# Patient Record
Sex: Female | Born: 1990 | Race: Black or African American | Hispanic: No | Marital: Single | Smoking: Never smoker
Health system: Southern US, Community
[De-identification: ages and names within clinical notes are randomized; demographics above are authoritative.]

## PROBLEM LIST (undated history)

## (undated) DIAGNOSIS — F329 Major depressive disorder, single episode, unspecified: Secondary | ICD-10-CM

## (undated) DIAGNOSIS — Z8759 Personal history of other complications of pregnancy, childbirth and the puerperium: Secondary | ICD-10-CM

## (undated) DIAGNOSIS — F32A Depression, unspecified: Secondary | ICD-10-CM

## (undated) DIAGNOSIS — N12 Tubulo-interstitial nephritis, not specified as acute or chronic: Secondary | ICD-10-CM

## (undated) DIAGNOSIS — Z8619 Personal history of other infectious and parasitic diseases: Secondary | ICD-10-CM

## (undated) DIAGNOSIS — O24419 Gestational diabetes mellitus in pregnancy, unspecified control: Secondary | ICD-10-CM

## (undated) DIAGNOSIS — G43909 Migraine, unspecified, not intractable, without status migrainosus: Secondary | ICD-10-CM

## (undated) HISTORY — DX: Depression, unspecified: F32.A

## (undated) HISTORY — DX: Personal history of other infectious and parasitic diseases: Z86.19

## (undated) HISTORY — DX: Gestational diabetes mellitus in pregnancy, unspecified control: O24.419

## (undated) HISTORY — DX: Personal history of other complications of pregnancy, childbirth and the puerperium: Z87.59

## (undated) HISTORY — DX: Major depressive disorder, single episode, unspecified: F32.9

## (undated) HISTORY — DX: Tubulo-interstitial nephritis, not specified as acute or chronic: N12

## (undated) HISTORY — DX: Migraine, unspecified, not intractable, without status migrainosus: G43.909

---
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
## 2009-05-18 ENCOUNTER — Emergency Department: Payer: Self-pay | Admitting: Emergency Medicine

## 2009-06-13 ENCOUNTER — Emergency Department: Payer: Self-pay | Admitting: Emergency Medicine

## 2009-07-09 DIAGNOSIS — N390 Urinary tract infection, site not specified: Secondary | ICD-10-CM | POA: Insufficient documentation

## 2009-07-09 DIAGNOSIS — Z8619 Personal history of other infectious and parasitic diseases: Secondary | ICD-10-CM | POA: Insufficient documentation

## 2009-07-09 DIAGNOSIS — N12 Tubulo-interstitial nephritis, not specified as acute or chronic: Secondary | ICD-10-CM

## 2009-07-09 HISTORY — DX: Tubulo-interstitial nephritis, not specified as acute or chronic: N12

## 2009-07-09 HISTORY — DX: Personal history of other infectious and parasitic diseases: Z86.19

## 2009-07-13 ENCOUNTER — Emergency Department: Payer: Self-pay | Admitting: Emergency Medicine

## 2009-09-06 ENCOUNTER — Inpatient Hospital Stay: Payer: Self-pay | Admitting: Obstetrics & Gynecology

## 2009-12-01 ENCOUNTER — Observation Stay: Payer: Self-pay

## 2009-12-03 ENCOUNTER — Observation Stay: Payer: Self-pay

## 2009-12-26 ENCOUNTER — Observation Stay: Payer: Self-pay

## 2010-01-09 ENCOUNTER — Inpatient Hospital Stay: Payer: Self-pay | Admitting: Obstetrics & Gynecology

## 2010-07-30 ENCOUNTER — Observation Stay: Payer: Self-pay | Admitting: Obstetrics and Gynecology

## 2010-10-01 DIAGNOSIS — O1423 HELLP syndrome (HELLP), third trimester: Secondary | ICD-10-CM

## 2011-11-29 DIAGNOSIS — O24419 Gestational diabetes mellitus in pregnancy, unspecified control: Secondary | ICD-10-CM

## 2012-08-04 ENCOUNTER — Emergency Department: Payer: Self-pay | Admitting: Emergency Medicine

## 2013-02-05 ENCOUNTER — Inpatient Hospital Stay: Payer: Self-pay

## 2013-02-05 LAB — URINALYSIS, COMPLETE
Bilirubin,UR: NEGATIVE
Glucose,UR: NEGATIVE mg/dL (ref 0–75)
Nitrite: NEGATIVE
Protein: NEGATIVE
RBC,UR: 1 /HPF (ref 0–5)
Specific Gravity: 1.011 (ref 1.003–1.030)
Squamous Epithelial: 31
WBC UR: 7 /HPF (ref 0–5)

## 2013-02-05 LAB — CBC WITH DIFFERENTIAL/PLATELET
Basophil #: 0 10*3/uL (ref 0.0–0.1)
Basophil %: 0.5 %
Eosinophil #: 0.1 10*3/uL (ref 0.0–0.7)
HGB: 10.1 g/dL — ABNORMAL LOW (ref 12.0–16.0)
Monocyte #: 0.9 x10 3/mm (ref 0.2–0.9)
Neutrophil %: 69.4 %
Platelet: 162 10*3/uL (ref 150–440)
RBC: 3.88 10*6/uL (ref 3.80–5.20)
WBC: 11 10*3/uL (ref 3.6–11.0)

## 2013-02-05 LAB — BASIC METABOLIC PANEL
Anion Gap: 7 (ref 7–16)
BUN: 11 mg/dL (ref 7–18)
Calcium, Total: 8.9 mg/dL (ref 8.5–10.1)
Co2: 24 mmol/L (ref 21–32)
Creatinine: 0.46 mg/dL — ABNORMAL LOW (ref 0.60–1.30)
EGFR (African American): >60
EGFR (Non-African Amer.): >60
Glucose: 88 mg/dL (ref 65–99)
Potassium: 3.7 mmol/L (ref 3.5–5.1)
Sodium: 136 mmol/L (ref 136–145)

## 2013-02-05 LAB — FETAL FIBRONECTIN: Fetal Fibronectin: NEGATIVE

## 2013-02-07 LAB — URINE CULTURE

## 2013-02-08 ENCOUNTER — Observation Stay: Payer: Self-pay | Admitting: Obstetrics and Gynecology

## 2013-02-08 LAB — URINALYSIS, COMPLETE
Nitrite: NEGATIVE
Ph: 7 (ref 4.5–8.0)
Protein: NEGATIVE
RBC,UR: 1 /HPF (ref 0–5)
Squamous Epithelial: 2
WBC UR: 1 /HPF (ref 0–5)

## 2013-02-08 LAB — BETA STREP CULTURE(ARMC)

## 2013-02-09 LAB — FETAL FIBRONECTIN
Appearance: NORMAL
Fetal Fibronectin: NEGATIVE

## 2013-02-09 LAB — GC/CHLAMYDIA PROBE AMP

## 2013-02-09 LAB — WET PREP, GENITAL

## 2013-02-14 ENCOUNTER — Observation Stay: Payer: Self-pay | Admitting: Obstetrics and Gynecology

## 2013-02-14 LAB — DRUG SCREEN, URINE
Amphetamines, Ur Screen: NEGATIVE (ref ?–1000)
Barbiturates, Ur Screen: NEGATIVE (ref ?–200)
Benzodiazepine, Ur Scrn: NEGATIVE (ref ?–200)
Cannabinoid 50 Ng, Ur ~~LOC~~: NEGATIVE (ref ?–50)
Cocaine Metabolite,Ur ~~LOC~~: NEGATIVE (ref ?–300)
MDMA (Ecstasy)Ur Screen: NEGATIVE (ref ?–500)
Methadone, Ur Screen: NEGATIVE (ref ?–300)
Phencyclidine (PCP) Ur S: NEGATIVE (ref ?–25)

## 2013-02-14 LAB — WBC: WBC: 10.9 10*3/uL (ref 3.6–11.0)

## 2013-02-14 LAB — HEMOGLOBIN: HGB: 9.5 g/dL — ABNORMAL LOW (ref 12.0–16.0)

## 2013-02-14 LAB — PLATELET COUNT: Platelet: 166 10*3/uL (ref 150–440)

## 2013-02-20 ENCOUNTER — Observation Stay: Payer: Self-pay | Admitting: Obstetrics and Gynecology

## 2013-02-20 ENCOUNTER — Ambulatory Visit (HOSPITAL_COMMUNITY)
Admission: AD | Admit: 2013-02-20 | Discharge: 2013-02-20 | Disposition: A | Discharge status: Home / Self Care | Payer: Medicaid Other | Source: Other Acute Inpatient Hospital | Attending: Obstetrics and Gynecology | Admitting: Obstetrics and Gynecology

## 2013-02-20 DIAGNOSIS — O2690 Pregnancy related conditions, unspecified, unspecified trimester: Secondary | ICD-10-CM | POA: Insufficient documentation

## 2013-02-20 LAB — BASIC METABOLIC PANEL
BUN: 8 mg/dL (ref 7–18)
Creatinine: 0.39 mg/dL — ABNORMAL LOW (ref 0.60–1.30)
Potassium: 4.5 mmol/L (ref 3.5–5.1)

## 2013-02-20 LAB — HEMOGLOBIN A1C: Hemoglobin A1C: 5.8 % (ref 4.2–6.3)

## 2013-02-28 ENCOUNTER — Inpatient Hospital Stay: Payer: Self-pay | Admitting: Obstetrics and Gynecology

## 2013-02-28 LAB — URINALYSIS, COMPLETE
Bilirubin,UR: NEGATIVE
Blood: NEGATIVE
Glucose,UR: NEGATIVE mg/dL (ref 0–75)
Ketone: NEGATIVE
Nitrite: NEGATIVE
Protein: NEGATIVE
Specific Gravity: 1.005 (ref 1.003–1.030)

## 2013-02-28 LAB — FETAL FIBRONECTIN: Fetal Fibronectin: NEGATIVE

## 2013-02-28 LAB — GC/CHLAMYDIA PROBE AMP

## 2013-03-01 DIAGNOSIS — O4700 False labor before 37 completed weeks of gestation, unspecified trimester: Secondary | ICD-10-CM

## 2013-03-01 DIAGNOSIS — R072 Precordial pain: Secondary | ICD-10-CM

## 2013-03-01 LAB — DRUG SCREEN, URINE
Amphetamines, Ur Screen: NEGATIVE (ref ?–1000)
Benzodiazepine, Ur Scrn: NEGATIVE (ref ?–200)
Cocaine Metabolite,Ur ~~LOC~~: NEGATIVE (ref ?–300)
Methadone, Ur Screen: NEGATIVE (ref ?–300)
Opiate, Ur Screen: NEGATIVE (ref ?–300)
Phencyclidine (PCP) Ur S: NEGATIVE (ref ?–25)

## 2013-03-02 LAB — URINE CULTURE

## 2013-03-06 ENCOUNTER — Observation Stay: Payer: Self-pay | Admitting: Obstetrics and Gynecology

## 2013-03-13 ENCOUNTER — Observation Stay: Payer: Self-pay | Admitting: Obstetrics and Gynecology

## 2013-03-17 ENCOUNTER — Observation Stay: Payer: Self-pay | Admitting: Obstetrics and Gynecology

## 2013-03-17 LAB — URINALYSIS, COMPLETE
Bilirubin,UR: NEGATIVE
Blood: NEGATIVE
Glucose,UR: NEGATIVE mg/dL (ref 0–75)
Ketone: NEGATIVE
Nitrite: NEGATIVE
Ph: 7 (ref 4.5–8.0)
Protein: NEGATIVE
RBC,UR: 4 /HPF (ref 0–5)
Specific Gravity: 1.006 (ref 1.003–1.030)
WBC UR: 51 /HPF (ref 0–5)

## 2013-03-17 LAB — CBC WITH DIFFERENTIAL/PLATELET
Basophil #: 0 10*3/uL (ref 0.0–0.1)
Eosinophil %: 0.7 %
HCT: 30.7 % — ABNORMAL LOW (ref 35.0–47.0)
HGB: 9.8 g/dL — ABNORMAL LOW (ref 12.0–16.0)
Lymphocyte %: 24.3 %
MCH: 24.5 pg — ABNORMAL LOW (ref 26.0–34.0)
MCHC: 31.9 g/dL — ABNORMAL LOW (ref 32.0–36.0)
MCV: 77 fL — ABNORMAL LOW (ref 80–100)
Monocyte #: 0.9 x10 3/mm (ref 0.2–0.9)
Neutrophil %: 66.1 %
Platelet: 159 10*3/uL (ref 150–440)

## 2013-03-18 LAB — DRUG SCREEN, URINE
Amphetamines, Ur Screen: NEGATIVE (ref ?–1000)
Methadone, Ur Screen: NEGATIVE (ref ?–300)
Opiate, Ur Screen: NEGATIVE (ref ?–300)
Phencyclidine (PCP) Ur S: NEGATIVE (ref ?–25)

## 2013-03-19 ENCOUNTER — Observation Stay: Payer: Self-pay | Admitting: Obstetrics and Gynecology

## 2013-03-25 ENCOUNTER — Inpatient Hospital Stay: Payer: Self-pay | Admitting: Obstetrics & Gynecology

## 2013-03-25 LAB — CBC WITH DIFFERENTIAL/PLATELET
Basophil %: 0.2 %
Eosinophil #: 0 10*3/uL (ref 0.0–0.7)
HCT: 31.5 % — ABNORMAL LOW (ref 35.0–47.0)
Lymphocyte #: 1.7 10*3/uL (ref 1.0–3.6)
Lymphocyte %: 9 %
MCH: 24.4 pg — ABNORMAL LOW (ref 26.0–34.0)
MCV: 77 fL — ABNORMAL LOW (ref 80–100)
Monocyte #: 0.9 x10 3/mm (ref 0.2–0.9)
Monocyte %: 4.8 %
Neutrophil #: 16.4 10*3/uL — ABNORMAL HIGH (ref 1.4–6.5)
Platelet: 167 10*3/uL (ref 150–440)
RDW: 16.7 % — ABNORMAL HIGH (ref 11.5–14.5)
WBC: 19.1 10*3/uL — ABNORMAL HIGH (ref 3.6–11.0)

## 2013-03-25 LAB — GC/CHLAMYDIA PROBE AMP

## 2013-03-26 LAB — HEMATOCRIT: HCT: 32.3 % — ABNORMAL LOW (ref 35.0–47.0)

## 2013-07-15 ENCOUNTER — Emergency Department: Payer: Self-pay | Admitting: Internal Medicine

## 2013-07-15 LAB — RAPID INFLUENZA A&B ANTIGENS (ARMC ONLY)

## 2013-10-16 ENCOUNTER — Emergency Department: Payer: Self-pay | Admitting: Emergency Medicine

## 2014-08-13 ENCOUNTER — Observation Stay: Payer: Self-pay | Admitting: Obstetrics and Gynecology

## 2014-08-13 LAB — COMPREHENSIVE METABOLIC PANEL
ANION GAP: 9 (ref 7–16)
AST: 13 U/L — AB (ref 15–37)
Albumin: 2.2 g/dL — ABNORMAL LOW (ref 3.4–5.0)
Alkaline Phosphatase: 54 U/L (ref 46–116)
BUN: 9 mg/dL (ref 7–18)
Bilirubin,Total: 0.4 mg/dL (ref 0.2–1.0)
CALCIUM: 8.5 mg/dL (ref 8.5–10.1)
CHLORIDE: 105 mmol/L (ref 98–107)
Co2: 19 mmol/L — ABNORMAL LOW (ref 21–32)
Creatinine: 0.36 mg/dL — ABNORMAL LOW (ref 0.60–1.30)
Osmolality: 273 (ref 275–301)
POTASSIUM: 3.9 mmol/L (ref 3.5–5.1)
SGPT (ALT): 10 U/L — ABNORMAL LOW (ref 14–63)
Sodium: 133 mmol/L — ABNORMAL LOW (ref 136–145)
Total Protein: 6.5 g/dL (ref 6.4–8.2)

## 2014-08-13 LAB — CBC WITH DIFFERENTIAL/PLATELET
Basophil #: 0.1 10*3/uL (ref 0.0–0.1)
Basophil %: 0.5 %
EOS ABS: 0.1 10*3/uL (ref 0.0–0.7)
Eosinophil %: 0.7 %
HCT: 37.5 % (ref 35.0–47.0)
HGB: 12.2 g/dL (ref 12.0–16.0)
LYMPHS PCT: 20.6 %
Lymphocyte #: 2.1 10*3/uL (ref 1.0–3.6)
MCH: 26.7 pg (ref 26.0–34.0)
MCHC: 32.7 g/dL (ref 32.0–36.0)
MCV: 82 fL (ref 80–100)
Monocyte #: 0.7 x10 3/mm (ref 0.2–0.9)
Monocyte %: 7 %
NEUTROS PCT: 71.2 %
Neutrophil #: 7.3 10*3/uL — ABNORMAL HIGH (ref 1.4–6.5)
Platelet: 161 10*3/uL (ref 150–440)
RBC: 4.59 10*6/uL (ref 3.80–5.20)
RDW: 14.7 % — AB (ref 11.5–14.5)
WBC: 10.2 10*3/uL (ref 3.6–11.0)

## 2014-08-13 LAB — DRUG SCREEN, URINE

## 2014-08-14 LAB — GC/CHLAMYDIA PROBE AMP

## 2014-08-31 ENCOUNTER — Observation Stay: Payer: Self-pay | Admitting: Obstetrics & Gynecology

## 2014-09-08 ENCOUNTER — Observation Stay: Payer: Self-pay | Admitting: Obstetrics & Gynecology

## 2014-09-17 ENCOUNTER — Observation Stay: Payer: Self-pay | Admitting: Obstetrics and Gynecology

## 2014-09-17 ENCOUNTER — Ambulatory Visit (HOSPITAL_COMMUNITY)
Admission: AD | Admit: 2014-09-17 | Discharge: 2014-09-17 | Disposition: A | Discharge status: Home / Self Care | Payer: Medicaid Other | Source: Other Acute Inpatient Hospital | Attending: Obstetrics and Gynecology | Admitting: Obstetrics and Gynecology

## 2014-09-17 DIAGNOSIS — O24419 Gestational diabetes mellitus in pregnancy, unspecified control: Secondary | ICD-10-CM | POA: Insufficient documentation

## 2014-10-09 ENCOUNTER — Inpatient Hospital Stay
Admit: 2014-10-09 | Disposition: A | Discharge status: Home / Self Care | Payer: Self-pay | Attending: Obstetrics and Gynecology | Admitting: Obstetrics and Gynecology

## 2014-10-09 LAB — BASIC METABOLIC PANEL
ANION GAP: 7 (ref 7–16)
BUN: 7 mg/dL
CHLORIDE: 109 mmol/L
CO2: 18 mmol/L — AB
Calcium, Total: 8.5 mg/dL — ABNORMAL LOW
Creatinine: 0.42 mg/dL — ABNORMAL LOW
EGFR (African American): >60
GLUCOSE: 210 mg/dL — AB
Potassium: 3.8 mmol/L
SODIUM: 134 mmol/L — AB

## 2014-10-09 LAB — CBC WITH DIFFERENTIAL/PLATELET
BASOS ABS: 0 10*3/uL (ref 0.0–0.1)
BASOS PCT: 0.2 %
EOS ABS: 0.1 10*3/uL (ref 0.0–0.7)
Eosinophil %: 0.7 %
HCT: 38.4 % (ref 35.0–47.0)
HGB: 12.3 g/dL (ref 12.0–16.0)
LYMPHS ABS: 1.6 10*3/uL (ref 1.0–3.6)
LYMPHS PCT: 10.7 %
MCH: 26.3 pg (ref 26.0–34.0)
MCHC: 32 g/dL (ref 32.0–36.0)
MCV: 82 fL (ref 80–100)
MONO ABS: 0.8 x10 3/mm (ref 0.2–0.9)
MONOS PCT: 5.8 %
NEUTROS ABS: 12 10*3/uL — AB (ref 1.4–6.5)
Neutrophil %: 82.6 %
Platelet: 162 10*3/uL (ref 150–440)
RBC: 4.68 10*6/uL (ref 3.80–5.20)
RDW: 16.1 % — ABNORMAL HIGH (ref 11.5–14.5)
WBC: 14.5 10*3/uL — ABNORMAL HIGH (ref 3.6–11.0)

## 2014-10-09 LAB — GC/CHLAMYDIA PROBE AMP

## 2014-10-10 HISTORY — PX: OTHER SURGICAL HISTORY: SHX169

## 2014-10-10 LAB — HEMATOCRIT: HCT: 41 % (ref 35.0–47.0)

## 2014-10-29 NOTE — Consult Note (Signed)
   Maternal Age 24   Gravida 5   Para 3   Term Deliveries 3   Preterm Deliveries 1   Abortions 0   Living Children 3   GA Assessment: (Weeks) 31 week(s)   (Days) 3 day(s)   Gestation Single   Blood Type (Maternal) AB positive   Antibody Screen Results (Maternal) negative   Indirect Coombs Negative   HIV Results (Maternal) negative   Gonorrhea Results (Maternal) negative   Chlamydia Results (Maternal) negative   Hepatitis C Culture (Maternal) unknown   Herpes Results (Maternal) n/a   VDRL/RPR/Syphilis Results (Maternal) negative   Varicella Titer Results (Maternal) Unknown   Rubella Results (Maternal) immune   Hepatitis B Surface Antigen Results (Maternal) negative   Group B Strep Results Maternal (Result >5wks must be treated as unknown) positive   Other Maternal Labs GC/chlamydia negative   Prenatal Care Poor    Additional Comments Mother presented to L&D with complaint of pelvic pain and mucus discharge x 2 days; pregnancy complicated by gestational diabetes, history of preterm labor and received betamethasone x 2 doses; upon arrival, magnesium sulfate initiated and clindamycin for GBS prophylaxis.  I met with mother and father and discussed complication of delivery at this gestational age including respiratory problems, temperature instability, feeding issues, likely length of stay, etc.  Parents verbalized understanding.  Neonatology will be present at delivery; please reconsult for questions as needed.  Attending addendum: I have discussed this case with NNP Maxime Beckner, and I agree with her note as above.   Parental Contact Parents informed at length regarding prenatal care and plan   Electronic Signatures: Nada Maclachlan (NP)  (Signed 23-Aug-14 19:42)  Authored: PREGNANCY and LABOR, ADDITIONAL COMMENTS Rhona Raider (MD)  (Signed 24-Aug-14 11:10)  Authored: ADDITIONAL COMMENTS  Co-Signer: PREGNANCY and LABOR, ADDITIONAL COMMENTS   Last  Updated: 24-Aug-14 11:10 by Rhona Raider (MD)

## 2014-11-01 LAB — SURGICAL PATHOLOGY

## 2014-11-07 NOTE — Op Note (Signed)
PATIENT NAME:  Chelsea Vazquez, Chelsea Vazquez MR#:  [id number] DATE OF BIRTH:  Nov 14, 1990  DATE OF PROCEDURE:  10/10/2014  PREOPERATIVE DIAGNOSES:  1. Postpartum.  2. Multiparity, desires permanent sterility.  POSTOPERATIVE DIAGNOSES:  1. Postpartum.  2. Multiparity, desires permanent sterility.   PROCEDURE: Postpartum bilateral tubal ligation.   ANESTHESIA: General.   SURGEON: Conard NovakStephen D. Deke Tilghman, M.D.   ANESTHESIA: General.   ESTIMATED BLOOD LOSS: Minimal.   OPERATIVE BLOOD FLUIDS: 1000 mL crystalloid.   COMPLICATIONS: None.   FINDINGS: Normal-appearing uterine fundus, bilateral fallopian tubes.   SPECIMENS: Portion of right and left fallopian tube.   CONDITION AT THE END OF THE PROCEDURE: Stable.   PROCEDURE IN DETAIL: The patient was taken to the operating room where general anesthesia was administered and found to be adequate. A small transverse infraumbilical incision was made with a scalpel. The incision was carried down through the underlying fascia, which was tagged with a 2-0 Vicryl on a UR-6 needle. The peritoneum was identified and entered. The peritoneum was noted to be free of any adhesions, and the incision was then extended with a scalpel.   The right fallopian tube was identified, brought to the incision, and grasped with a Babcock clamp. The tube was then followed out to the fimbria. The Babcock clamp was then used to grasp the tube approximately 4 cm from the cornual region. A 3 cm segment of tube was then ligated with a free tie of plain gut. A second free tie of plain gut was then used to reinforce the initial one. This segment was then excised using Metzenbaum scissors. Good hemostasis was noted and the tube was returned to the abdomen. The left fallopian tube was then ligated in a similar fashion and a 3 cm segment was excised. Hemostasis was noted and the tube was returned to the abdomen. Again, the tube was followed out to the fimbriated end to assure the procedure was  carried out on the correct structure.   The fascia was then closed in a single layer using #2-0 Vicryl. The skin was closed in a subcuticular fashion using 3-0 Vicryl undyed. The closure was then reinforced using surgical skin glue.   The patient tolerated the procedure well. Sponge, lap, and needle counts were correct x 2. The patient was taken to the recovery room in stable condition.    ____________________________ Conard NovakStephen D. Falyn Rubel, MD sdj:JT D: 10/10/2014 10:38:51 ET T: 10/10/2014 12:01:12 ET JOB#: [id number]  cc: Conard NovakStephen D. Jeb Schloemer, MD, <Dictator> Conard NovakSTEPHEN D Margeret Stachnik MD ELECTRONICALLY SIGNED 11/04/2014 9:54

## 2014-11-07 NOTE — Consult Note (Signed)
Chief Complaint and History:  Chief Complaint Diabetes   Allergies:  Amoxicillin: Swelling, Itching  Assessment/Plan:  Assessment/Plan 24 yo F s/p vag delivery yesterday seen in consultation for diabetes mellitus. States she has a h/o gestation diabetes with a prior pregnancy. Not diagnosed with diabetes in this pregnancy until 08/2014. Started on insulin and on admission, she was taking Lantus 55 units qhs and NovoLog 15 units tid AC. She has a glucometer & monitors sugars. Baby boy born at 34 weeks weighing 4 lbs 15 oz. Does not plan to breast feed. Sugars now in the range of 148-232. Over last 24 hours has receied 14 units NovoLog. No Lantus given. She has no complaints. Appetite is fair.  She was examined and chart was reviewed.   A/ Diabetes mellitus - gestational vs type 2  P/ Take Lantus 10 units nightly Take NovoLog correction qAC: if sugar 150-200 take 4 units, if sugar 201-250 tkae 6 units, if sugar over 251 take 8 units Instructions written out and reviewed with her. Will arrange out-patient follow up in my clinic in 2 weeks.   Electronic Signatures: Raj JanusSolum, Anna M (MD)  (Signed 04-Apr-16 13:50)  Authored: Chief Complaint and History, ALLERGIES, Assessment/Plan   Last Updated: 04-Apr-16 13:50 by Raj JanusSolum, Anna M (MD)

## 2014-11-07 NOTE — Consult Note (Signed)
PATIENT NAME:  Chelsea Vazquez, Chelsea Vazquez MR#:  [id number] DATE OF BIRTH:  [birth year]  DATE OF CONSULTATION:  10/11/2014  REFERRING PHYSICIAN:  Marta Antu, CNM CONSULTING PHYSICIAN:  A. Wendall Mola, MD  CHIEF COMPLAINT: Diabetes.   HISTORY OF PRESENT ILLNESS: This is a 24 year old female G6, P2-3-0-4 admitted 2 days ago in labor, status post vaginal delivery on 10/09/2014. The patient was diagnosed in February 2016 with diabetes, and started on insulin. Prior to admission she was taking Lantus 55 units daily and NovoLog 15 units t.i.d. before meals. States she monitors blood sugars regularly. Since admission, she has been given NovoLog insulin sliding scale. Over the last 24 hours, blood sugars have been in the range of 148 to 232 and she has received approximately 16 units of NovoLog insulin. Appetite is good. Does not plan to breast feed. States she had gestational diabetes with a prior pregnancy. No recent hemoglobin A1c to review.  PAST MEDICAL HISTORY: 1.  Gestational diabetes.  2.  Depression.   PAST SURGICAL HISTORY: Tubal ligation 10/10/2014.  OUTPATIENT MEDICATIONS: 1.  Lantus 55 units daily.  2.  NovoLog insulin 15 units t.i.d. before meals.   ALLERGIES: AMOXICILLIN.   SOCIAL HISTORY: This is the patient's fifth child. She is not married. Denies use of tobacco and alcohol.   FAMILY HISTORY: Both sets of grandparents had type 2 diabetes. One cousin with type 1 diabetes. One sister with borderline diabetes.  REVIEW OF SYSTEMS:  GENERAL: No weight loss.  No fever.  HEENT: No blurred vision. No sore throat.  NECK: No neck pain. No dysphasia.  CARDIAC: No chest pain or palpitation.  PULMONARY: No cough. No shortness of breath.  ABDOMEN: No abdominal pain at this time. Appetite is fair.  EXTREMITIES: Denies lower extremity swelling.  SKIN: Denies rash or recent skin changes.  ENDOCRINE: Denies heat or cold intolerance.  GENITOURINARY: Denies dysuria or hematuria.   PHYSICAL  EXAMINATION: VITAL SIGNS: Height 60 inches, weight 115 pounds, BMI 22.5, temperature 97.1, pulse 73, respirations 17, BP 109/69.  GENERAL: African American female in no acute distress, petite.  HEENT: EOMI. Oropharynx is clear. Mucous membranes moist.  NECK: Supple. No appreciable thyromegaly. No thyroid bruit.  LUNGS: Clear to auscultation bilaterally. No wheeze.  ABDOMEN: Diffusely soft, distended due to pregnancy.  CARDIAC: Regular rate and rhythm.  EXTREMITIES: No peripheral edema.  NEUROLOGIC: No dysarthria. No tremor of outstretched hands.  SKIN: No rash or dermatopathy noted.  PSYCHIATRIC: Calm, cooperative.   LABORATORY DATA: On 10/09/2014, glucose 210, BUN 7, creatinine 0.42, sodium 134, CO2 18, calcium 8.5. WBC 14.5, platelets 162,000, hemoglobin 12.3, hematocrit 38.4.   ASSESSMENT: Diabetes mellitus, likely type II, uncontrolled.   RECOMMENDATIONS: 1.  Given her recent insulin requirements during hospitalization, I recommend she be discharged on a regimen of Lantus 10 units at bedtime and NovoLog before meals: If sugar 150 to 200 then four units, if 201 to 250 then 6 units, if greater than 251 then 8 units.  2.  Insulin instructions were written out for the patient today.  3.  Check blood sugars 3 times daily.  4.  Will arrange for outpatient follow-up in my clinic in 1 to 2 weeks. Request the patient bring her glucometer to that clinic appointment. 5.  Reviewed goals of diabetes including goal blood sugars. I have provided her with my information and encouraged her to call the clinic if there are any questions or concerns.   Thank you for the kind request for consultation.  ____________________________ A. Wendall MolaMelissa Arcangel Minion, MD ams:sb D: 10/11/2014 13:59:31 ET T: 10/11/2014 14:57:12 ET JOB#: [id number]  cc: A. Wendall MolaMelissa Poonam Woehrle, MD, <Dictator> Macy MisA. MELISSA Penni Penado MD ELECTRONICALLY SIGNED 10/15/2014 17:26

## 2014-11-16 NOTE — H&P (Signed)
L&D Evaluation:  History Expanded:  HPI 24 yo G5P2203 at 7261w5d gestational age by LMP, pregnancy complicated by history of preterm delivery with G4 (35 weeks), stillborn with G3 at 7 months, this pregnancy complicated by GDM.  She initiated prenatal care in [place] and has moved to the area and has not yet established a care provider. She had an appointment on 8/7 at [facility] to initiate care.  She was seen on L&D 3 days ago for uterine contractions and had a negative fetal fibronectin and was discharged.  She presents today after noting contractions at about 1030-1100pm last night. They were coming about every 3 minutes. She also noted that she was having episodes of diarrhea with contractions.  She also had nausea but no emesis with each contraction.  She denies fevers, chills, urinary, and vaginal symptoms.   Gravida 5   Term 2   PreTerm 2   Abortion 0   Living 3   Blood Type (Maternal) AB positive   Group B Strep Results Maternal (Result >5wks must be treated as unknown) unknown/result > 5 weeks ago   [facility]   Patient's Medical History 1) asthma, 2) history of migraine headaches, 3) Depression, 4) history of trichomonas   Patient's Surgical History none   Medications Pre Natal Vitamins   Allergies amoxicillin - throat closes   Social History none   Family History Non-Contributory   ROS:  ROS All systems were reviewed.  HEENT, CNS, GI, GU, Respiratory, CV, Renal and Musculoskeletal systems were found to be normal., unless noted in HPI   Exam:  Vital Signs AFVSS T98.4, RR16, P95, BP118/62   Urine Protein negative dipstick   General no apparent distress   Mental Status clear   Chest clear   Heart normal sinus rhythm, II/VI systolic ejection murmur   Abdomen gravid, non-tender   Back no CVAT   Edema no edema   Pelvic no external lesions, 1/long/hi (no change over two hours), recheck 5 hours later with no change   Mebranes Intact   FHT  normal rate with no decels   Ucx irregular, 2-3 q 10 min - spaced out overnight   Impression:  Impression No current active labor   Plan:  Plan UA   Comments - Wet prep neg - Gonorrhea/chlamydia neg - no evidence of urinary tract infection - s/p 1 liter bolus of LR - She was called "finger tip" on 7/31 and was administered BMTZ x 1 dose on the morning 8/1.  She did not return for her second dose.  She was called "finger tip" by the RN this morning.  When comparing exams, her finger-tip exam is equivalent to my 1cm exam.  Therefore, it is unlikely she has had any change.  Will discharge home with follow up in 3 days for [facility]. Will give second dose of her course of betamethasone as she did not return for it on 8/2.  precautions given.   Follow Up Appointment already scheduled. 02/12/13   Labs:  Lab Results:  BF Analysis:  04-Aug-14 23:32   Fetal Fibronectin (comp) NEGATIVE-Fetal Fibronectin NOT Detected.  Appearance NORMAL  Characteristic CLEAR, COLORLESS, AQUEOUS Specimen characteristic that may be outside the parameter of a clear, colorless, aqueous sample (i.e. pink, bloody, yellow, thick, mucus-like) may cause an invalid or false positive result.  Routine Micro:  04-Aug-14 01:31   Micro Text Report WET PREP   COMMENT  FEW WHITE BLOOD CELLS SEEN   COMMENT                   NO TRICHOMONAS,SPERMATOZOA,YEAST,OR CLUE CELLS SEEN   ANTIBIOTIC                       Micro Text Report CHLAM/N.GC RT-PCR ([facility])   CHLAMYDIA                 CHLAMYDIA TRACHOMATIS NEGATIVE   N.GONORRHOEAE             N.GONORRHOEAE NEGATIVE   ANTIBIOTIC                       Comment 1. FEW WHITE BLOOD CELLS SEEN  Comment 2. NO TRICHOMONAS,SPERMATOZOA,YEAST,OR CLUE CELLS SEEN  Result(s) reported on 09 Feb 2013 at 01:40AM.  Routine UA:  03-Aug-14 23:32   Color (UA) Yellow  Clarity (UA) Clear  Glucose (UA) Negative  Bilirubin (UA) Negative   Ketones (UA) Negative  Specific Gravity (UA) 1.010  Blood (UA) Negative  pH (UA) 7.0  Protein (UA) Negative  Nitrite (UA) Negative  Leukocyte Esterase (UA) Trace (Result(s) reported on 08 Feb 2013 at 11:53PM.)  RBC (UA) 1 /HPF  WBC (UA) 1 /HPF  Bacteria (UA) NONE SEEN  Epithelial Cells (UA) 2 /HPF  Mucous (UA) PRESENT (Result(s) reported on 08 Feb 2013 at 11:53PM.)   Electronic Signatures: Conard NovakJackson, Sy Saintjean D (MD)  (Signed 04-Aug-14 08:09)  Authored: L&D Evaluation, Labs   Last Updated: 04-Aug-14 08:09 by Conard NovakJackson, Devontae Casasola D (MD)

## 2014-11-16 NOTE — H&P (Signed)
L&D Evaluation:  History:  HPI 23yo BF [id number] with T2DM diagnosed this pregnancy @ 31 weeks 1 day gestation by 26week US derived EDC of 11/18/14 who presents via EMS for irregular contractions.  Patient She reports contractions every 7-10 minutes this afternoon. She also states that her FSBS 2hrpp after lunch was 205  and that her usual FSBSs run 105-110. She is suppose to take insulin qid, but can not tell me the dose or the kind of insulin she takes. She states she has [facility] [place] and that she is currently taking what sound like short acting insulin either 10 units or 15 units with meals amount varrying based on what she eats.  She has 10 units at dinner today at 05:30.  Reports Fasting BS in the 100-110 range, and postprandials as high as 160.  She presents today with irregular contractions and concerns she might have broken her water with clear vaginal discharge   She had no prenatal care until she [facility] to [facility] at [facility] 26.1 with vaginal bleeding and cramping, was found to be 2cm/long, and [facility] She was inpatient there for 8 days and was diagnosed with diabetes and started on insulin. She received betamethasone sometime during that admission. She also had a fetal echo that was WNL.   She was then [facility] and [facility] with threatened preterm labor and was released the next day.  Denies VB. Baby active. OB history:  5x SVD 2008 - 41w 2011 - 37w 2012 - 27w stillborn - abruption 2013 - 35w 2014 - 35w with elevated 1hr, no 3hr follow up 2016 - current pregnancy, diabetes on insulin  Prenatal labs obtained here: AB pos, RPR NR, Hep B neg, Rubella Immune, Varicella Immune, GCCT neg, UDS neg 08/13/14, Blood Glucose 220.  US obtained here on 08/13/14 EGA 26.1 [facility]   Presents with contractions, pelvic pressure, mucous discharge   Patient's Medical History T2DM, depression    Patient's Surgical History none   Medications Pre Natal Vitamins  Insulin   Allergies amoxicillin - throat itching   Social History drugs  marijuana   Family History Non-Contributory   ROS:  ROS see HPI   Exam:  Vital Signs stable  Fingerstick 302   Urine Protein trace   General no apparent distress   Mental Status clear   Abdomen gravid, non-tender   Edema no edema   Pelvic 2/long/high   Mebranes Intact   FHT normal rate with no decels   Ucx absent   Impression:  Impression other, IUP at 31 weeks 1 day gestation type II DM   Plan:  Plan EFM/NST, monitor contractions and for cervical change   Comments 1) R/O Rupture - negative nitrazine, negative ferning, negative pooling - GC/CT sent  2) R/O PTL cervix unchanged from prior checks  3) Fetus - category I tracing  4) Hyperglycemia - BMP and UA, will check for ketones and anion gap - cover with additional 6 units of SSI - if no evidence of DKA will see how BS responds to sliding scale dose and discuss transfer to [facility].   Electronic Signatures: Lorrene ReidStaebler, Jasaun Carn M (MD)  (Signed 11-Mar-16 22:31)  Authored: L&D Evaluation   Last Updated: 11-Mar-16 22:31 by Lorrene ReidStaebler, Cleon Thoma M (MD)

## 2014-11-16 NOTE — H&P (Signed)
L&D Evaluation:  History:  HPI 24yo BF [id number] with T2DM diagnosed this pregnancy @ 29.6 weeks by 26week US with EDC of 11/18/14 who presents today with "passing mucous plug." and pelvic pressure. She reports contractions every 7-10 minutes this afternoon. She also states that her FSBS 2hrpp after lunch was 205  and that her usual FSBSs run 105-110. She is suppose to take insulin qid, but can not tell me the dose or the kind of insulin she takes. She states she has [facility] weekly [facility] [place]and that she had to cx her appt today because of lack of childcare.  She had no prenatal care until she [facility]1 with vaginal bleeding and cramping, was found to be 2cm/long, and [facility] She was inpatient there for 8 days and was diagnosed with diabetes and started on insulin. She received betamethasone sometime during that admission. She also had a fetal echo that was WNL. She was then [facility] Feb [facility] and [facility] with threatened preterm labor and was released the next day.  Denies VB. Baby active. OB history:  5x SVD 2008 - 41w 2011 - 37w 2012 - 27w stillborn - abruption 2013 - 35w 2014 - 35w with elevated 1hr, no 3hr follow up 2016 - current pregnancy, diabetes on insulin  Prenatal labs obtained here: AB pos, RPR NR, Hep B neg, Rubella Immune, Varicella Immune, GCCT neg, UDS neg 08/13/14, Blood Glucose 220.  US obtained here on 08/13/14 EGA 26.1 [facility]   Presents with contractions, pelvic pressure, mucous discharge   Patient's Medical History T2DM, depression   Patient's Surgical History none   Medications Pre Natal Vitamins  Insulin   Allergies amoxicillin - throat itching   Social History drugs  marijuana   Family History Non-Contributory   ROS:  ROS see HPI   Exam:  Vital Signs stable    Urine Protein trace   General no apparent distress, texting while talking to patient    Mental Status clear    Abdomen gravid, non-tender   Edema no edema    Pelvic SSE: foul smelling green thin vaginal discharge. wet prep positive for TNTC WBC and Trichimonas. negative for monilia. No  herpetic lesions seen.  Cx:1/TH/OOP   Mebranes Intact   FHT normal rate with no decels   Ucx irregular   Other FSBS=195   Impression:  Impression IUP at 29.6 weeks with Trichimonas. T2DM with  hyperglycemia   Plan:  Plan EFM/NST, monitor contractions and for cervical change, Flagyl 2 gm for Trichimonas with food. [facility].   Electronic Signatures: Trinna BalloonGutierrez, Doralyn Kirkes L (CNM)  (Signed 02-Mar-16 22:12)  Authored: L&D Evaluation   Last Updated: 02-Mar-16 22:12 by Trinna BalloonGutierrez, Vincenzo Stave L (CNM)

## 2014-11-16 NOTE — H&P (Signed)
L&D Evaluation:  History Expanded:  HPI 24 yo G5 P2203   previous abruption. here at 30 w 2days week because she passed a clot of mucus with a blodd streak in it at home, abnl pap with LGSIL, she had two term delvieris, then an abruption with preeclampsia at 32 weeks with a demise, then a preterm delivery at 35 weeks, with gestational diabetes, did not take her insulin and went in to labor at 35 weeks, all vaginal. desires PP BTl and we will sign papers today. She had steroids in July x 1 but did not show up for second dose, came to ER and got another dose on Aug 3. she has had no pnc in the office and we have done all the labs here except for 28 week glucola. she got the steroids for preterm contractions that were painful and changed her cervix.   Gravida 5   Term 2   PreTerm 2   Abortion 0   Living 4   Blood Type (Maternal) AB positive   Group B Strep Results Maternal (Result >5wks must be treated as unknown) unknown/result > 5 weeks ago   Maternal HIV Negative   Maternal Syphilis Ab Nonreactive   Maternal Varicella Immune   Rubella Results (Maternal) immune   EDC 29-Apr-2013   Presents with blood stained musous discharge   Patient's Medical History Diabetes   Medications flexaril and ibuprofen on chart as taking will check with pt,   Allergies NKDA   Social History none   Family History Non-Contributory   Current Prenatal Course Notable For Bleeding  no prenatal care in an office, she only gets care in ER and her last appointment was a few months ago in [facility].   ROS:  ROS All systems were reviewed.  HEENT, CNS, GI, GU, Respiratory, CV, Renal and Musculoskeletal systems were found to be normal.   Exam:  Vital Signs stable   Urine Protein not completed   General no apparent distress   Mental Status clear   Chest clear   Heart normal sinus rhythm   Abdomen gravid, non-tender   Estimated Fetal Weight Small for gestational age   Back no CVAT   Pelvic no  external lesions, 1/50   Mebranes Intact   FHT spontaneous decel eration,   FHT Description 140   Ucx absent   Skin dry   Lymph no lymphadenopathy   Impression:  Impression bleeding   Plan:  Plan UA, EFM/NST, monitor contractions and for cervical change   Comments cvx 1 cm pt has had mtwo spiontaneous decelerations over two-eour minutes about 30 minutes aPART. GIVEN hr PREGNANACY AND 30 W WILL TRANSFER TO [facility]   Follow Up Appointment in 3-7 days for staple removal or incision check   Electronic Signatures: Adria DevonKlett, Neila Teem (MD)  (Signed 15-Aug-14 18:08)  Authored: L&D Evaluation   Last Updated: 15-Aug-14 18:08 by Adria DevonKlett, Shereena Berquist (MD)

## 2014-11-16 NOTE — H&P (Signed)
L&D Evaluation:  History:  HPI 24 year old G5 P3103 at 335w1d by LMP derived EDC=22 October 14, seen in clinic today where NST was non-reactive.  Normal fluid and growth scan today.  No LOF, no VB, +FM, still some intermitten pressure unchanged   Presents with non-reactive NST   Patient's Medical History GDM (elevated 1-hr but no 3-hr to make formal diagnosis)   Patient's Surgical History none   Medications Pre Natal Vitamins   Allergies Amoxicillin (throat itche/swells)   Social History drugs  MJ   Family History Non-Contributory   ROS:  ROS see HPI   Exam:  Vital Signs BG 87   Urine Protein not completed   General no apparent distress   Mental Status clear   Chest no increased work of breathing   Abdomen gravid, non-tender   Estimated Fetal Weight Average for gestational age   Fetal Position vtx   Edema no edema   Pelvic cvx 2/50/high   Mebranes Intact   FHT 130, moderate variability, positive accels, no decels   FHT Description moderate variability   Ucx irritability   Skin dry, no rashes   Plan:  Plan EFM/NST   Comments - Reactive NST discharge home with clinic follow up next week   Follow Up Appointment already scheduled   Electronic Signatures: Lorrene ReidStaebler, Antoinette Borgwardt M (MD)  (Signed 11-Sep-14 16:51)  Authored: L&D Evaluation   Last Updated: 11-Sep-14 16:51 by Lorrene ReidStaebler, Allister Lessley M (MD)

## 2014-11-16 NOTE — H&P (Signed)
L&D Evaluation:  History:  HPI 24 year old G5 46P3103 with LMP derived EDC=22 October 14 presents with continued pelvic pressure as with her prior visits.  No dysuria.  At the time of her last visit she states that she had not felt the baby move in 2 days but had a reactive NST with good movement noted once her on L&D.  Since that visit she has noted good fetal movement.  She did not keep her clinic follow up which was an hour prior to her presentation here on L&D, states she rescheduled but checking in the clinic system she does not have any future appointments.  Denies vaginal bleeding or LOF.   This is her 7th L&D triage visit this pregnancy. OB HX is significant for an SVD 2008 at 41 weeks, in 2011 at 37 weeks, in 2012 at 7 mos (baby was stillborn due to an abruption), and in 11/2011 she delivered at 35 weeks. She reports not being on 17 P injections. Had an elevated one hr GTT in May,  (did have GDM with last pregnancy) but has not had a 3 hr GTT test and has not started diet or checking FSBS. [facility] and evaluated for abdominal pain. She has received two doses of BMZ earlier this month. Her GBS culture from 1 Aug was positive. She was then seen 15 August for contractions and [facility] having FHR decels. [facility] days of monitoring.LABS from 9 Aug: AB POS, VI, RI, HIV negative, HBSAG neg, GBS positive, H&H 9.5/28.4% She has [facility].   Presents with pelvic pain/pressure   Patient's Medical History GDM (elevated 1-hr but no 3-hr to make formal diagnosis)    Patient's Surgical History none    Medications Pre Natal Vitamins    Allergies Amoxicillin (throat itche/swells)   Social History drugs  MJ    Family History Non-Contributory    ROS:  ROS see HPI   Exam:  Vital Signs BG 87   General no apparent distress   Mental Status clear    Chest no increased work of breathing    Abdomen  gravid, non-tender   Estimated Fetal Weight Average for gestational age   Fetal Position vtx   Edema no edema    Pelvic cvx 2/50/high   Mebranes Intact   FHT 130-140, moderate variability, positive accels with two late deceleration and one subtle longer decel lasting approximately 5 minutes witch drop in baseline by 10BPM   FHT Description moderate variability   Ucx absent   Skin dry, no rashes   Plan:  Comments 1) [facility] at 12:30 for growth/anatomy scan and HROB follow up, also to meet with social work at that visit given majority of care this pregnancy has been via L&D triage and one visit in [place]      - patient has not completed 3-hr, elevated 1-hr gluocse test but recent HgbA1C of 5.8, and random BG of 87 today  2) Fetus - three late decelerations noted on monitoring, she has had these earlier on in the pregnancy which prompted [facility] at [facility] weeks were the patient received 24-hrs of continuous monitoring followed by NST on antepartum floor the following day.  She was discarged on postadmission day 3, there is no note made of any late deceleartions during that admission. - s/p BMZ on 8/1 & 8/4 -  GBS positive on prior admission - BPP today 8/8 AFI stable from prior at 23cm - I do not have records of any formal anatomy scan will plan to obtain thursday as outpatient if reassuring surveillance overnight - Discussed possibility of proceeding with LTCS for delivery if persistant non-reassuring surveillance.  The patient has a history of prior IUFD secondary to severe pre-eclampsia/HELLP syndrome and abruption at 30 weeks  3) Disposition - pending prolonged monitoring   Follow Up Appointment already scheduled   Electronic Signatures: Lorrene ReidStaebler, Charne Mcbrien M (MD)  (Signed 09-Sep-14 20:03)  Authored: L&D Evaluation   Last Updated: 09-Sep-14 20:03 by Lorrene ReidStaebler, Cresencio Reesor M (MD)

## 2014-11-16 NOTE — H&P (Signed)
L&D Evaluation:  History:  HPI [age] year old G5 6P3103 with EDC=22 October 14 by LMP=07/23/2012 presents with c/o increased pelvic pressure as with her prior visits and no fetal movement for the last 2 days.  She did not keep her clinic follow up this week.  Denies vaginal bleeding or LOF.   This is her 7th L&D triage visit this pregnancy. OB HX is significant for an SVD 2008 at 41 weeks, in 2011 at 37 weeks, in 2012 at 7 mos (baby was stillborn due to an abruption), and in 11/2011 she delivered at 35 weeks. She reports not being on 17 P injections. Had an elevated one hr GTT in May,  (did have GDM with last pregnancy) but has not had a 3 hr GTT test and has not started diet or checking FSBS. [facility] and evaluated for abdominal pain. She has received two doses of BMZ earlier this month. Her GBS culture from 1 Aug was positive. She was then seen 15 August for contractions and [facility] was having FHR decels. [facility] after 3 days of monitoring.LABS from 9 Aug: AB POS, VI, RI, HIV negative, HBSAG neg, GBS positive, H&H 9.5/28.4% She has [facility].   Presents with contractions, pelvic pain/pressure   Patient's Medical History GDM with this ? pregnancy and the prior pregnancy    Patient's Surgical History none    Medications Pre [place]atal Vitamins    Allergies Amoxicillin (throat itche/swells)   Social History drugs  MJ    Family History Non-Contributory    ROS:  ROS see HPI   Exam:  Vital Signs stable    General no apparent distress   Mental Status clear    Chest clear    Heart normal sinus rhythm, no murmur/gallop/rubs   Abdomen gravid, non-tender   Edema no edema    Pelvic cvx 1-2 cm per prior Ultrasound   Mebranes Intact   FHT normal rate with no decels   FHT Description moderate variability   Ucx absent   Skin dry, no rashes   Impression:  Impression decreased fetal movement    Plan:  Plan EFM/NST, monitor contractions and for cervical change   Comments - Good fetal movements on bedside US, reactive NST, no contractions and stable cervix from prior exams - Follow up on 9/9 14:00   Follow Up Appointment already scheduled   Electronic Signatures: Lorrene ReidStaebler, Ilisha Blust M (MD)  (Signed 09-Sep-14 15:46)  Authored: L&D Evaluation   Last Updated: 09-Sep-14 15:46 by Lorrene ReidStaebler, Leisl Spurrier M (MD)

## 2014-11-16 NOTE — H&P (Signed)
L&D Evaluation:  History:  HPI 24 year old G5 10P3103 with EDC=22 October 14 by LMP=07/23/2012 presents with c/o increased pelvic pressure x 2 days worsening at 1430 today. Did not know she was contracting. Denies vaginal bleeding or LOF.  Denies dysuria, bleeding, diarrhea, N/V. but has noticed a mucoid vaginal discharge over the last 2-3 days. [place]NC started in in [place]. Her records from [place] only reveal two visits.  OB HX is significant for an SVD 2008 at 41 weeks, in 2011 at 37 weeks, in 2012 at 7 mos (baby was stillborn due to an abruption), and in 11/2011 she delivered at 35 weeks. She reports not being on 17 P injections. Had an elevated one hr GTT in May,  (did have GDM with last pregnancy) but has not had a 3 hr GTT test and has not started diet or checking FSBS. [facility] and evaluated for abdominal pain. She has received two doses of BMZ earlier this month. Her GBS culture from 1 Aug was positive. She was then seen 15 August for contractions and was [facility] when baby was having FHR decels. [facility].LABS from 9 Aug: AB POS, VI, RI, HIV negative, HBSAG neg, GBS positive, H&H 9.5/28.4%   Presents with contractions, pelvic pain/pressure   Patient's Medical History GDM with this ? pregnancy and the prior pregnancy   Patient's Surgical History none   Medications Pre [place]atal Vitamins   Allergies Amoxicillin (throat itche/swells)   Social History drugs  MJ   Family History Non-Contributory   ROS:  ROS see HPI   Exam:  Vital Signs stable  FSBS=110   General no apparent distress   Mental Status clear   Chest clear   Heart normal sinus rhythm, no murmur/gallop/rubs   Abdomen gravid, non-tender   Fetal Position vtx on US   Edema no edema   Pelvic no external lesions, wet prep negative. Aptima done. cx 2-3/50%/-2 on initial RN exam and 2/40%/ballotable after magnesium started.   Mebranes Intact, AFI=17.99cm    FHT 135-140 with accels to 150s, one decel to 90s x 60 sec after a ctx while on left side.   FHT Description moderate variability   Ucx q4-6 min initially, now occasional contraction with some uterine irritability   Skin dry, no rashes   Impression:  Impression IUP at 31 3/7 weeks with preterm labor-contractions responding to magnesium.  Overall ressuring  FHR pattern   Plan:  Plan EFM/NST, monitor contractions and for cervical change, Transition NICU nurse notified of admission. Discussed case with Dr Patton SallesWeaver Lee. Decrease magnesium to 1GM/hr due to small stature to prevent mag toxicity.  Clindamycin for GBS prophyllaxis. UA and culture. GC/Chlamydia culture done.   Electronic Signatures: Trinna BalloonGutierrez, Makenzye Troutman L (CNM)  (Signed 24-Aug-14 16:09)  Authored: L&D Evaluation   Last Updated: 24-Aug-14 16:09 by Trinna BalloonGutierrez, Alfhild Partch L (CNM)

## 2014-11-16 NOTE — H&P (Signed)
L&D Evaluation:  History:  HPI 24yo [id number] @ 28.1 weeks by 26week US with EDC of 11/18/14.  Presents today with decreased fetal movement, pelvic pressure and contractions.  She had no prenatal care until she [facility] at [facility] 26.1 with vaginal bleeding and cramping, was found to be 2cm/long, and was [facility]. She was inpatient there for 8 days and was diagnosed with diabetes and started on insulin.  She received betamethasone sometime during that admission.   We do not have records pertaining to this. Denies LOF, VB, Recent intercourse or vaginal commotion, cervical exams in last 24 hours; OB history:  5x SVD 2008 - 41w 2011 - 37w 2012 - 27w stillborn - abruption 2013 - 35w 2014 - 35w with elevated 1hr, no 3hr follow up 2016 - current pregnancy, diabetes on insulin  Prenatal labs obtained here: AB pos, RPR NR, Hep B neg, Rubella Immune, Varicella Immune, GCCT neg, UDS neg 08/13/14, Blood Glucose 220.  US obtained here on 08/13/14 EGA 26.1 [facility]14 patient denies any sexually transmitted infections   Presents with contractions, decreased fetal movement, pelvic pressure   Patient's Medical History No Chronic Illness   Patient's Surgical History none   Medications Pre Natal Vitamins   Allergies amoxicillin - throat itching   Social History drugs  marijuana   Family History Non-Contributory   ROS:  ROS All systems were reviewed.  HEENT, CNS, GI, GU, Respiratory, CV, Renal and Musculoskeletal systems were found to be normal.   Exam:  Vital Signs stable   Urine Protein not completed   General no apparent distress   Mental Status clear   Chest clear   Heart normal sinus rhythm   Abdomen gravid, non-tender   Estimated Fetal Weight Average for gestational age   Fetal Position breech by bedside ultrasound   Back no CVAT   Edema no edema   Reflexes 2+   Clonus negative   Pelvic copious green/yellow discharge, uniform plaque-like  2mm lesions on cervix/posterior vagina, cervix long 1-2cm   Mebranes Intact, nitrazine positive, but likely due to discharge.  ferning negative. no pooling.   FHT upon placement of EFM, fetal heart tones recovering from deceleration, noted to be in the 80s with spontaneous return to baseline = 140  no accels.   Ucx regular, on admission q1084min   Skin dry   Lymph no lymphadenopathy   Other Bedside ultrasound performed:  Breech, with ample movement and ample fluid Posterior/fundal placenta, no evidence of separation or disruption EFW 1134g (2lb 8oz) with AUA 11/21/14  Wet mount performed and no evidence of trich, clue cells or pseudohyphae, but obscured by copious white cells.  Nitrazine blue.   Plan:  Comments 24yo [id number] @ 28.1 with preterm contractions, decreased fetal movement, and deceleration  1. IUP category 2 tracing,  deceleration isolated, none since initial at admission breech continuous toco/efm  2. Preterm Ctx - Magnesuim Sulfate bolus 6g given for neuroprotection and incidental tocolysis. Maintenence at 2g/hr.  2 IVs placed.  -Transfer to [facility] for continued care if nursery has a bed available - pending confirmation -FFN collected and pending **POSITIVE**  3. Vaginal/cervical lesions -  could be normal variant of vaginal epithelium, with the uniformity of the plaques, however may warrant an HSV swab - unfortunately unavailable to me at the time of my exam.  Blood antibodies ordered.  4. GBS collected today   Electronic Signatures: Ward, Elenora Fenderhelsea C (MD)  (Signed 23-Feb-16 23:25)  Authored: L&D Evaluation  Last Updated: 23-Feb-16 23:25 by Ward, Elenora Fenderhelsea C (MD)

## 2014-11-16 NOTE — H&P (Signed)
L&D Evaluation:  History:  HPI 24 year old G5 P85 with EDC=29 April 2013 presents at 28 weeks via EMS with lower abdominal pain x 2 days worsening at 1830 tonight. EMTs report patient was crying with pain when they first arrived. She has been feeling more ctxs today and occur about 4 x/hr. The ctxs worsen the lower abdominal pain. Denies dysuria, but has noticed a mucoid vaginal discharge over the last 2-3 days. Also c/o nausea and she reports vomiting after eating a chicken salad sandwich today at 2 PM. Has not tried eating since then. Has also had heartburn and indigestion most of this pregnancy. PNC in [place]. Will not be able to get her records until the AM. OB HX is significant for an SVD 2008 at 41 weeks, in 2011 at 37 weeks, in 2012 at 7 mos (baby was stillborn), and in 11/2011 she delivered at 35 weeks. She reports not being on 17 P injections. Recently diagnosed with GDM (did have with last pregnancy) and has not started diet or checking FSBS.   Presents with abdominal pain, contractions, nausea/vomiting   Patient's Medical History GDM with this pregnancy and the prior pregnancy   Patient's Surgical History none   Medications Pre Natal Vitamins   Allergies Amoxicillin (throat swells)   Social History drugs  MJ-last used yesterday   Family History Non-Contributory   ROS:  ROS see HPI   Exam:  Vital Signs 98.1-101-16    120/66   Urine Protein negative dipstick, UA: sp grav: 1.011, 2+ leuks, 1 RBC, 7WBC, 31 epi cells, 2+ bacteria   General no apparent distress   Mental Status clear   Chest clear   Heart normal sinus rhythm, no murmur/gallop/rubs   Abdomen gravid, generalized tenderness but more tender over round ligaments or when displacing uterus to left or right   Estimated Fetal Weight 2#6 oz on US   Fetal Position cephalic on [place]   Edema no edema   Reflexes 2+   Pelvic no external lesions, FFN done, multip cx with closed int os/thick/OOP   Mebranes  Intact   FHT Appropriate for gestational age.   Ucx irregular   Skin dry   Other [place]: CGA 28 wk 1 day. 4 chamber heart, stomach, bladder, 2 feet seen. Placenta anterior and not low lying. AF appears normal   Impression:  Impression IUP at 28 weeks with nausea/vomiting today. 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 PT contractions.   Plan:  Plan EFM/NST, monitor contractions and for cervical change, IV hydration, antiemetics, protonix for GERD,. Diet as tolerated. FFN, urine culture, CBC and met B.   Comments 2100: FFN negative. Met B: lytes, BUN, cr WNL. glucose 88. CBC: WBC=11K, H&H=10.1 &30.4%   Electronic Signatures: Chelsea Vazquez (CNM)  (Signed 31-Jul-14 23:43)  Authored: L&D Evaluation   Last Updated: 31-Jul-14 23:43 by Chelsea Vazquez (CNM)

## 2014-11-16 NOTE — H&P (Signed)
L&D Evaluation:  History:  HPI 24 year old [id number] at 5347w1d by US optained today (unsure LMP sometime in August 2015) presenting with vaginal spotting this morning.  Does report intermitten abdominal cramping.  +FM, no LOF.  The patient has not received any prenatal care this pregnancy.    OB HX is significant for an SVD 2008 at 41 weeks, in 2011 at 37 weeks, in 2012 at 7 mos (baby was stillborn due to an abruption), and in 11/2011 she delivered at 35 weeks and 03/25/13 at [redacted] weeks gestation. Her pregnancy in 2014 was complicated by an abnormal 1-hr with no follow up 3-hr test result.  She did not received 17-P previously because of lack of compliance.   Presents with vaginal bleeding   Patient's Medical History No Chronic Illness   Patient's Surgical History none   Medications Pre Natal Vitamins   Allergies Amoxicillin (throat itches)   Social History drugs  MJ   Family History Non-Contributory   ROS:  ROS see HPI   Exam:  Vital Signs stable   Urine Protein not completed   General no apparent distress   Mental Status clear   Chest clear   Heart normal sinus rhythm   Abdomen gravid, non-tender   Estimated Fetal Weight Average for gestational age   Pelvic no external lesions, 2/long/high   Mebranes Intact   FHT normal rate with no decels, 145, moderate, no accels, no decels   Ucx irregular, q778min   Other US: Single viable IUP 7947w1d EDC 11-18-14, Max vetical pocket is 7.2cm, posterior placetna, vertex presentation, female sex   Impression:  Impression 24 yo [id number] at 4547w1d with elevated BG and possible preterm labor   Plan:  Comments 1) PTL - history of PTL x 3 - FFN - Cvx 2/long/high -GBS, GC&CT obtained - LR at 150 mL/hr - Magnesium suflate for CP ppx - ancef (ampicillin allergy) - BMZ - Transfer to level 1 NICU  2) Fetus - category I tracing  3) Elevated BG 249 on basic with 220 on recheck - SSI  4) PNL unavailable AB positive per prior delivery  records  5) Pending transfer   Electronic Signatures: Lorrene ReidStaebler, Hildreth Orsak M (MD)  (Signed 05-Feb-16 19:56)  Authored: L&D Evaluation   Last Updated: 05-Feb-16 19:56 by Lorrene ReidStaebler, Severus Brodzinski M (MD)

## 2014-11-16 NOTE — H&P (Signed)
L&D Evaluation:  History:  HPI 23yo BF [id number] with T2DM diagnosed this pregnancy @ 34 weeks 2 days gestation by 26week US derived EDC of 11/18/14 who presents via EMS for contractions starting early this morning and increasing in intensity.  Decreased fetal movement,  no LOF, no VB.   OB history:  5x SVD 2008 - 41w 2011 - 37w 2012 - 27w stillborn - abruption 2013 - 35w 2014 - 35w with elevated 1hr, no 3hr follow up 2016 - current pregnancy, diabetes on insulin  Prenatal labs obtained here: AB pos, RPR NR, Hep B neg, Rubella Immune, Varicella Immune, GCCT neg, UDS neg 08/13/14, Blood Glucose 220.  US [facility] here [facility] 08/13/14 [facility]   Presents with contractions   Patient's Medical History T2DM, depression   Patient's Surgical History none   Medications Pre Natal Vitamins  Lantus 55U qAM, and SSI 5-15U with meals   Allergies amoxicillin - throat itching   Social History drugs  marijuana   Family History Non-Contributory   ROS:  ROS see HPI   Exam:  Vital Signs stable  BG 119 via EMS   Urine Protein trace   General no apparent distress   Mental Status clear   Abdomen gravid, non-tender   Edema no edema   Pelvic 4/70/-2 ballotable, US confirmed vtx   Mebranes Intact   FHT normal rate with no decels   Ucx regular, q325min   Impression:  Impression other, IUP at 34 weeks 2 day gestation type II DM   Plan:  Plan EFM/NST, monitor contractions and for cervical change   Comments 1) R/O - recheck cervix in 1-hr, previously compelted course of BMZ late february - will start IV send admission labs in case patient progresses - GBS negative on 08/31/14   2) Fetus - category I tracing, amoxicillin allergy would cover with ancef for GBS   3) Hyperglycemia - recheck BMP and UA on admission, give half dose of lantus (30U this AM)   Electronic Signatures: Lorrene ReidStaebler, Endrit Gittins M (MD)  (Signed 02-Apr-16 07:55)  Authored: L&D Evaluation   Last Updated:  02-Apr-16 07:55 by Lorrene ReidStaebler, Ruhani Umland M (MD)

## 2014-11-16 NOTE — H&P (Signed)
L&D Evaluation:  History Expanded:  HPI 24 year old G5 1P3103 with EDC=22 October 14 by LMP=07/23/2012 presents with c/o increased pelvic pressure.  Denies vaginal bleeding or LOF.  Denies dysuria, bleeding, diarrhea, N/V. PNC started in in [place]. Her records from [place] only reveal two visits.  OB HX is significant for an SVD 2008 at 41 weeks, in 2011 at 37 weeks, in 2012 at 7 mos (baby was stillborn due to an abruption), and in 11/2011 she delivered at 35 weeks. She reports not being on 17 P injections. Had an elevated one hr GTT in May,  (did have GDM with last pregnancy) but has not had a 3 hr GTT test and has not started diet or checking FSBS. [facility] and evaluated for abdominal pain. She has received two doses of BMZ earlier this month. Her GBS culture from 1 Aug was positive. She was then seen 15 August for contractions and was [facility] was having FHR decels. [facility] days of monitoring.LABS from 9 Aug: AB POS, VI, RI, HIV negative, HBSAG neg, GBS positive, H&H 9.5/28.4% She has established prenatal care [facility].   Presents with contractions, pelvic pain/pressure   Patient's Medical History GDM with this ? pregnancy and the prior pregnancy   Patient's Surgical History none   Medications Pre [place]atal Vitamins   Allergies Amoxicillin (throat itche/swells)   Social History drugs  MJ   Family History Non-Contributory   ROS:  ROS see HPI   Exam:  Vital Signs stable  FSBS=170   General no apparent distress   Mental Status clear   Chest clear   Heart normal sinus rhythm, no murmur/gallop/rubs   Abdomen gravid, non-tender   Edema no edema   Pelvic cvx 1-2 cm per RN over 2 checks 2 hours apart   Mebranes Intact   FHT 135 with accels/no decels/mod var   FHT Description moderate variability   Ucx uterine irritibility upon arrival seems to have settled down since arriving   Skin dry, no rashes    Impression:  Impression IUP at 32 2/7 weeks with preterm labor during this pregnancy.  No evidence of active labor today.   Plan:  Plan EFM/NST, monitor contractions and for cervical change   Comments This patient has been seen numerous times on L&D since the beginning of august and has even received bmtz and has been [facility] for non-reassuring antenatal testing.  She is certainly a high-risk patient who is at risk for already have GDM this pregnancy and potentially delivering pre-term.  She has signs of labor at this time and her cervical exam reveals a cervix less dilated than previously.   She has an appointment in 5 days during which she will have her 3 hour gtt. She was strongly encouraged to keep this appointment.  She was seen at [facility] to establish care with Farrel Connersolleen Gutierrez, CNM, two days ago.  She was given an Rx for nifedipine for symptoms of uterine irritability.  She was encouraged to take this as needed.  I also encouraged pelvic rest for her and plenty of hydration.  She voiced understanding to all of these suggestions.   Follow Up Appointment already scheduled. 03/11/13   Electronic Signatures: Conard NovakJackson, Karter Haire D (MD)  (Signed 29-Aug-14 18:01)  Authored: L&D Evaluation   Last Updated: 29-Aug-14 18:01 by Conard NovakJackson, Bridgett Hattabaugh D (MD)

## 2014-11-16 NOTE — H&P (Signed)
L&D Evaluation:  History Expanded:  HPI 24 year old G5 P2203 at 3734w0d by LMP derived EDC=22 October 14, presents with regular uterine contractions since 11pm last night.  No LOF, no VB, +FM, She states that she feels like she has to push when she urinates.  This is her 10th L&D triage visit this pregnancy. OB HX is significant for an SVD 2008 at 41 weeks, in 2011 at 37 weeks, in 2012 at 7 mos (baby was stillborn due to an abruption), and in 11/2011 she delivered at 35 weeks. She reports not being on 17 P injections. Had an elevated one hr GTT in May,  (did have GDM with last pregnancy) but has not had a 3 hr GTT test and has not started diet or checking FSBS. She was [facility]/31 and evaluated for abdominal pain. She has received two doses of BMZ earlier this month. Her GBS culture from 1 Aug was positive. She was then seen 15 August for contractions and was [facility] when baby was having FHR decels. [facility] days of monitoring.  LABS from 9 Aug: AB POS, VI, RI, HIV negative, HBSAG neg, GBS positive, H&H 9.5/28.4% She has [facility].   Patient's Medical History GDM (elevated 1-hr but no 3-hr to make formal diagnosis)   Patient's Surgical History none   Medications Pre Natal Vitamins   Allergies Amoxicillin (throat itches)   Social History drugs  MJ   Family History Non-Contributory   ROS:  ROS see HPI   Exam:  Vital Signs T 98.3, P 115, RR 20, BP 104/60   Urine Protein not completed   General no apparent distress   Mental Status clear   Chest no increased work of breathing   Abdomen gravid, non-tender   Estimated Fetal Weight Average for gestational age   Edema no edema   Pelvic cvx 3/60/-2 change to 6/90-2 (bulging bag)   Mebranes Intact   FHT cat 2   FHT Description 140/mod var/+accels/+ variable decels (occasional late decels) decels are infrequent so far   Ucx 3-4 q 10 min   Skin dry, no  rashes   Impression:  Impression 1) Intrauterine pregnancy at 35 weeks, 2) preterm labor   Plan:  Plan UA, EFM/NST, monitor contractions and for cervical change, antibiotics for GBBS prophylaxis   Comments admit for preterm labor GBS+ Ancef notify peds   Electronic Signatures: Conard NovakJackson, Stephen D (MD)  (Signed 17-Sep-14 07:01)  Authored: L&D Evaluation   Last Updated: 17-Sep-14 07:01 by Conard NovakJackson, Stephen D (MD)

## 2014-11-16 NOTE — H&P (Signed)
L&D Evaluation:  History Expanded:  HPI 24 yo G5P2203 at 6359w3d gestational age by LMP, pregnancy complicated by history of preterm delivery with G4 (35 weeks), stillborn with G3 at 7 months with that pregnancy complicated by GDM.  She [place] prenatal care in [place] and has moved to the area and has not yet established a care provider. She was supposed to have an appointment on 8/7 at [facility] to initiate care.  However, according to the patient she was turned away due to lack of records.  She was seen on L&D on 7/31 and on 8/4 for uterine contractions and had negative fetal fibronectin x 2.  She presents today after noting contractions at about 1030-1100pm last night. They were coming about every 3 minutes.  She also had nausea but no emesis with each contraction.  She denies fevers, chills, urinary, and vaginal symptoms.   Gravida 5   Term 2   PreTerm 2   Abortion 0   Living 3   Blood Type (Maternal) AB positive   Group B Strep Results Maternal (Result >5wks must be treated as unknown) unknown/result > 5 weeks ago   [facility]   Patient's Medical History 1) asthma, 2) history of migraine headaches, 3) Depression, 4) history of trichomonas   Patient's Surgical History none   Medications Pre Natal Vitamins   Allergies amoxicillin - throat closes   Social History none   Family History Non-Contributory   ROS:  ROS All systems were reviewed.  HEENT, CNS, GI, GU, Respiratory, CV, Renal and Musculoskeletal systems were found to be normal., unless noted in HPI   Exam:  Vital Signs AFVSS T98.5, RR20, P99, BP103/53   Urine Protein negative dipstick   General no apparent distress   Mental Status clear   Chest clear   Heart normal sinus rhythm, II/VI systolic ejection murmur   Abdomen gravid, non-tender   Back no CVAT   Edema no edema   Pelvic no external lesions, 1/long/hi (no change over visits on 8/4 and 8/9)   Mebranes Intact   FHT 140/mod  var/+10x10 accels/ one late deceleration to 120   Ucx irregular, infrequent   Impression:  Impression No current active labor   Plan:  Plan EFM/NST   Comments - Prenatal panel as we have no record of  this and she is now 29 weeks. - [facility] [place] - Urine Drug Screen - negative - GBS collected given 1cm dilated and presenting multiple times with contractions - Fetal well being: overall reassuring.  Though she is having decelerations with contractions of unknown significance.  This does not occur with every contraction. - She needs to establish local Obstetrical care ASAP.  I have provided her with a piece of paper with type-written instructions to hand to the front-desk person at [facility] which specifically states to [facility] and to schedule a NOB appt with a 1 hour gtt. - Rest of prenatal panel pending at time of discharge today. - Rx given for Zofran, PNVs, and Iron Sulfate 325mg  bid   Follow Up Appointment need to schedule. [facility] at [facility]   Labs:  Lab Results:  Urine Drugs:  09-Aug-14 01:55   Tricyclic Antidepressant, Ur Qual (comp) NEGATIVE (Result(s) reported on 14 Feb 2013 at 02:04AM.)  Amphetamines, Urine Qual. NEGATIVE  MDMA, Urine Qual. NEGATIVE  Cocaine Metabolite, Urine Qual. NEGATIVE  Opiate, Urine qual NEGATIVE  Phencyclidine, Urine Qual. NEGATIVE  Cannabinoid,  Urine Qual. NEGATIVE  Barbiturates, Urine Qual. NEGATIVE  Benzodiazepine, Urine Qual. NEGATIVE (----------------- The URINE DRUG SCREEN provides only a preliminary, unconfirmed analytical test result and should not be used for non-medical  purposes.  Clinical consideration and professional judgment should be  applied to any positive drug screen result due to possible interfering substances.  A more specific alternate chemical method must be used in order to obtain a confirmed analytical result.   Gas chromatography/mass spectrometry (GC/MS) is the preferred confirmatory method.)  Methadone, Urine Qual. NEGATIVE  Routine Hem:  09-Aug-14 01:56   Hematocrit (CBC)  28.4 (Result(s) reported on 14 Feb 2013 at 02:21AM.)  Hemoglobin (CBC)  9.5 (Result(s) reported on 14 Feb 2013 at 02:21AM.)  Platelet Count (CBC) 166 (Result(s) reported on 14 Feb 2013 at 02:21AM.)  RBC (CBC)  3.65 (Result(s) reported on 14 Feb 2013 at 02:21AM.)  WBC (CBC) 10.9 (Result(s) reported on 14 Feb 2013 at 02:21AM.)   Electronic Signatures: Conard NovakJackson, Zena Vitelli D (MD)  (Signed 09-Aug-14 02:44)  Authored: L&D Evaluation, Labs   Last Updated: 09-Aug-14 02:44 by Conard NovakJackson, Samson Ralph D (MD)

## 2015-06-08 ENCOUNTER — Emergency Department
Admission: EM | Admit: 2015-06-08 | Discharge: 2015-06-09 | Disposition: A | Discharge status: Home / Self Care | Payer: Self-pay | Attending: Emergency Medicine | Admitting: Emergency Medicine

## 2015-06-08 ENCOUNTER — Emergency Department: Payer: Self-pay

## 2015-06-08 DIAGNOSIS — R197 Diarrhea, unspecified: Secondary | ICD-10-CM | POA: Insufficient documentation

## 2015-06-08 DIAGNOSIS — R1031 Right lower quadrant pain: Secondary | ICD-10-CM | POA: Insufficient documentation

## 2015-06-08 DIAGNOSIS — Z3202 Encounter for pregnancy test, result negative: Secondary | ICD-10-CM | POA: Insufficient documentation

## 2015-06-08 DIAGNOSIS — Z880 Allergy status to penicillin: Secondary | ICD-10-CM | POA: Insufficient documentation

## 2015-06-08 DIAGNOSIS — E1165 Type 2 diabetes mellitus with hyperglycemia: Secondary | ICD-10-CM | POA: Insufficient documentation

## 2015-06-08 DIAGNOSIS — R1110 Vomiting, unspecified: Secondary | ICD-10-CM | POA: Insufficient documentation

## 2015-06-08 DIAGNOSIS — E119 Type 2 diabetes mellitus without complications: Secondary | ICD-10-CM

## 2015-06-08 LAB — CBC WITH DIFFERENTIAL/PLATELET
Basophils Absolute: 0.1 10*3/uL (ref 0–0.1)
Basophils Relative: 1 %
EOS PCT: 2 %
Eosinophils Absolute: 0.1 10*3/uL (ref 0–0.7)
HEMATOCRIT: 43.7 % (ref 35.0–47.0)
Hemoglobin: 14.3 g/dL (ref 12.0–16.0)
Lymphocytes Relative: 31 %
Lymphs Abs: 3 10*3/uL (ref 1.0–3.6)
MCH: 26.4 pg (ref 26.0–34.0)
MCHC: 32.6 g/dL (ref 32.0–36.0)
MCV: 81 fL (ref 80.0–100.0)
Monocytes Absolute: 0.6 10*3/uL (ref 0.2–0.9)
Monocytes Relative: 6 %
NEUTROS ABS: 5.8 10*3/uL (ref 1.4–6.5)
Neutrophils Relative %: 60 %
PLATELETS: 251 10*3/uL (ref 150–440)
RBC: 5.4 MIL/uL — AB (ref 3.80–5.20)
RDW: 14 % (ref 11.5–14.5)
WBC: 9.6 10*3/uL (ref 3.6–11.0)

## 2015-06-08 LAB — COMPREHENSIVE METABOLIC PANEL
ALBUMIN: 4 g/dL (ref 3.5–5.0)
ALT: 14 U/L (ref 14–54)
ANION GAP: 9 (ref 5–15)
AST: 18 U/L (ref 15–41)
Alkaline Phosphatase: 63 U/L (ref 38–126)
BUN: 14 mg/dL (ref 6–20)
CHLORIDE: 96 mmol/L — AB (ref 101–111)
CO2: 24 mmol/L (ref 22–32)
Calcium: 9.7 mg/dL (ref 8.9–10.3)
Creatinine, Ser: 0.55 mg/dL (ref 0.44–1.00)
GFR calc Af Amer: >60 mL/min (ref >60)
GFR calc non Af Amer: >60 mL/min (ref >60)
GLUCOSE: 508 mg/dL — AB (ref 65–99)
Potassium: 4.1 mmol/L (ref 3.5–5.1)
SODIUM: 129 mmol/L — AB (ref 135–145)
Total Bilirubin: 0.5 mg/dL (ref 0.3–1.2)
Total Protein: 8.1 g/dL (ref 6.5–8.1)

## 2015-06-08 LAB — URINALYSIS COMPLETE WITH MICROSCOPIC (ARMC ONLY)
Bilirubin Urine: NEGATIVE
Glucose, UA: >500 mg/dL — AB
Hgb urine dipstick: NEGATIVE
LEUKOCYTES UA: NEGATIVE
NITRITE: NEGATIVE
PROTEIN: NEGATIVE mg/dL
Specific Gravity, Urine: 1.033 — ABNORMAL HIGH (ref 1.005–1.030)
pH: 7 (ref 5.0–8.0)

## 2015-06-08 LAB — GLUCOSE, CAPILLARY: Glucose-Capillary: 381 mg/dL — ABNORMAL HIGH (ref 65–99)

## 2015-06-08 LAB — PREGNANCY, URINE: Preg Test, Ur: NEGATIVE

## 2015-06-08 MED ORDER — NAPROXEN 500 MG PO TABS: 500.0000 mg | ORAL_TABLET | Freq: Two times a day (BID) | ORAL | Status: DC

## 2015-06-08 MED ORDER — KETOROLAC TROMETHAMINE 30 MG/ML IJ SOLN
30.0000 mg | Freq: Once | INTRAMUSCULAR | Status: AC
  Administered 2015-06-08: 30 mg via INTRAVENOUS
  Filled 2015-06-08: qty 1

## 2015-06-08 MED ORDER — IOHEXOL 240 MG/ML SOLN
50.0000 mL | Freq: Once | INTRAMUSCULAR | Status: AC | PRN
  Administered 2015-06-08: 50 mL via ORAL

## 2015-06-08 MED ORDER — SODIUM CHLORIDE 0.9 % IV BOLUS (SEPSIS)
1000.0000 mL | Freq: Once | INTRAVENOUS | Status: AC
  Administered 2015-06-08: 1000 mL via INTRAVENOUS

## 2015-06-08 MED ORDER — IOHEXOL 350 MG/ML SOLN
100.0000 mL | Freq: Once | INTRAVENOUS | Status: AC | PRN
  Administered 2015-06-08: 100 mL via INTRAVENOUS

## 2015-06-08 MED ORDER — INSULIN ASPART 100 UNIT/ML ~~LOC~~ SOLN
4.0000 [IU] | Freq: Once | SUBCUTANEOUS | Status: AC
  Administered 2015-06-08: 4 [IU] via INTRAVENOUS
  Filled 2015-06-08: qty 4

## 2015-06-08 NOTE — ED Provider Notes (Signed)
[facility] Emergency Department Provider Note  ____________________________________________  Time seen: 7:50 PM  I have reviewed the triage vital signs and the nursing notes.   HISTORY  Chief Complaint Abdominal Pain    HPI Chelsea Vazquez is a 24 y.o. female who complains of constant gradually worsening right sided abdominal pain for the past 2 days. Never had anything like this before, no nausea vomiting diarrhea, normal intake. No dysuria frequency urgency vaginal discharge or bleeding. No aggravating or alleviating factors. Constant, gradual onset. Nonradiating. Feels achy    History reviewed. No pertinent past medical history.   There are no active problems to display for this patient.    History reviewed. No pertinent past surgical history. Tubal ligation  Current Outpatient Rx  Name  Route  Sig  Dispense  Refill  . naproxen (NAPROSYN) 500 MG tablet   Oral   Take 1 tablet (500 mg total) by mouth 2 (two) times daily with a meal.   20 tablet   0      Allergies Amoxicillin   History reviewed. No pertinent family history.  Social History Social History  Substance Use Topics  . Smoking status: Never Smoker   . Smokeless tobacco: None  . Alcohol Use: No    Review of Systems  Constitutional:   No fever or chills. No weight changes Eyes:   No blurry vision or double vision.  ENT:   No sore throat. Cardiovascular:   No chest pain. Respiratory:   No dyspnea or cough. Gastrointestinal:   Positive as above for abdominal pain, vomiting and diarrhea.  No BRBPR or melena. Genitourinary:   Negative for dysuria, urinary retention, bloody urine, or difficulty urinating. Musculoskeletal:   Negative for back pain. No joint swelling or pain. Skin:   Negative for rash. Neurological:   Negative for headaches, focal weakness or numbness. Psychiatric:  No anxiety or depression.   Endocrine:  No hot/cold intolerance, changes in energy, or  sleep difficulty.  10-point ROS otherwise negative.  ____________________________________________   PHYSICAL EXAM:  VITAL SIGNS: ED Triage Vitals  Enc Vitals Group     BP 06/08/15 1820 118/73 mmHg     Pulse Rate 06/08/15 1820 94     Resp 06/08/15 1820 16     Temp 06/08/15 1820 98.2 F (36.8 C)     Temp Source 06/08/15 1820 Oral     SpO2 06/08/15 1820 97 %     Weight 06/08/15 1820 105 lb (47.628 kg)     Height 06/08/15 1820 5' (1.524 m)     Head Cir --      Peak Flow --      Pain Score 06/08/15 1821 10     Pain Loc --      Pain Edu? --      Excl. in GC? --      Constitutional:   Alert and oriented. Well appearing and in no distress. Eyes:   No scleral icterus. No conjunctival pallor. PERRL. EOMI ENT   Head:   Normocephalic and atraumatic.   Nose:   No congestion/rhinnorhea. No septal hematoma   Mouth/Throat:   MMM, no pharyngeal erythema. No peritonsillar mass. No uvula shift.   Neck:   No stridor. No SubQ emphysema. No meningismus. Hematological/Lymphatic/Immunilogical:   No cervical lymphadenopathy. Cardiovascular:   RRR. Normal and symmetric distal pulses are present in all extremities. No murmurs, rubs, or gallops. Respiratory:   Normal respiratory effort without tachypnea nor retractions. Breath sounds are clear and  equal bilaterally. No wheezes/rales/rhonchi. Gastrointestinal:   Soft with right lower quadrant tenderness, positive Rovsing sign, positive obturator sign. No distention. There is no CVA tenderness.  No rebound, rigidity, or guarding. Genitourinary:   deferred Musculoskeletal:   Nontender with normal range of motion in all extremities. No joint effusions.  No lower extremity tenderness.  No edema. Neurologic:   Normal speech and language.  CN 2-10 normal. Motor grossly intact. No pronator drift.  Normal gait. No gross focal neurologic deficits are appreciated.  Skin:    Skin is warm, dry and intact. No rash noted.  No petechiae, purpura, or  bullae. Psychiatric:   Mood and affect are normal. Speech and behavior are normal. Patient exhibits appropriate insight and judgment.  ____________________________________________    LABS (pertinent positives/negatives) (all labs ordered are listed, but only abnormal results are displayed) Labs Reviewed  COMPREHENSIVE METABOLIC PANEL - Abnormal; Notable for the following:    Sodium 129 (*)    Chloride 96 (*)    Glucose, Bld 508 (*)    All other components within normal limits  URINALYSIS COMPLETEWITH MICROSCOPIC ([facility] ONLY) - Abnormal; Notable for the following:    Color, Urine STRAW (*)    APPearance CLEAR (*)    Glucose, UA >500 (*)    Ketones, ur TRACE (*)    Specific Gravity, Urine 1.033 (*)    Bacteria, UA RARE (*)    Squamous Epithelial / LPF 0-5 (*)    All other components within normal limits  CBC WITH DIFFERENTIAL/PLATELET - Abnormal; Notable for the following:    RBC 5.40 (*)    All other components within normal limits  PREGNANCY, URINE   ____________________________________________   EKG    ____________________________________________    RADIOLOGY  CT abdomen and pelvis pending  ____________________________________________   PROCEDURES   ____________________________________________   INITIAL IMPRESSION / ASSESSMENT AND PLAN / ED COURSE  Pertinent labs & imaging results that were available during my care of the patient were reviewed by me and considered in my medical decision making (see chart for details).  Patient presents with right lower quadrant abdominal pain which is tender to the touch. Low suspicion for torsion TOA STI or PID. No evidence of pyelonephritis. Labs are unremarkable. We'll get a CT scan of the abdomen pelvis. If unremarkable she'll need to follow-up for further monitoring of her symptoms and abdominal exams the future as this could still be an early appendicitis. Patient has been counseled on all of this. She is also found to  have very mild hyponatremia at 129. She also has hyperglycemia with a serum glucose of 508. There is no evidence of acidosis or ketosis. This appears to be an acute phase reactant as previous blood sugar results for her have not been nearly this high. She should follow up with primary care for further evaluation of possible diabetes. In the meantime, we'll give her IV fluids here which will help with the hyponatremia as well as the hyperglycemia.  Care the patient signed out to Dr. Inocencio Homes pending the CT scan.   ____________________________________________   FINAL CLINICAL IMPRESSION(S) / ED DIAGNOSES  Final diagnoses:  RLQ abdominal pain   hyperglycemia    Sharman Cheek, MD 06/08/15 2133

## 2015-06-08 NOTE — ED Notes (Addendum)
patient comes in with complaints of headache and right lower quadrant pain that started yesterday.

## 2015-06-08 NOTE — ED Notes (Signed)
Pt. States rt. Sided flank pain for the past 2 days.  Pt. States pain radiates traverse across lower abdominal area.  Pt. States OTC medications have not given any relief.  Pt. Denies similar pain in the past.  Pt. States intermittent HA for the past  2 weeks.  Pt. Denies N/V.  Pt. Denies dysuria. Pt. Denies vaginal discharge.

## 2015-06-09 LAB — GLUCOSE, CAPILLARY
GLUCOSE-CAPILLARY: 325 mg/dL — AB (ref 65–99)
Glucose-Capillary: 342 mg/dL — ABNORMAL HIGH (ref 65–99)

## 2015-06-09 MED ORDER — METFORMIN HCL 500 MG PO TABS: 500.0000 mg | ORAL_TABLET | Freq: Two times a day (BID) | ORAL | Status: DC

## 2015-06-09 NOTE — ED Provider Notes (Signed)
-----------------------------------------   12:43 AM on 06/09/2015 -----------------------------------------  Care was assumed from Dr. Scotty CourtStafford at approximately 10 PM pending results of CT abdomen and pelvis which is negative for any acute pathology. Blood glucose was 381 after fluids, I ordered insulin.  Care to Dr. Manson PasseyBrown pending re-check.  Gayla DossEryka A Fedra Lanter, MD 06/09/15 [phone]

## 2015-06-09 NOTE — ED Notes (Signed)
Patient discharged to home per MD order. Patient in stable condition, and deemed medically cleared by ED provider for discharge. Discharge instructions reviewed with patient/family using "Teach Back"; verbalized understanding of medication education and administration, and information about follow-up care. Denies further concerns. ° °

## 2015-06-09 NOTE — Discharge Instructions (Signed)
Abdominal Pain, Adult Many things can cause abdominal pain. Usually, abdominal pain is not caused by a disease and will improve without treatment. It can often be observed and treated at home. Your health care provider will do a physical exam and possibly order blood tests and X-rays to help determine the seriousness of your pain. However, in many cases, more time must pass before a clear cause of the pain can be found. Before that point, your health care provider may not know if you need more testing or further treatment. HOME CARE INSTRUCTIONS Monitor your abdominal pain for any changes. The following actions may help to alleviate any discomfort you are experiencing:  Only take over-the-counter or prescription medicines as directed by your health care provider.  Do not take laxatives unless directed to do so by your health care provider.  Try a clear liquid diet (broth, tea, or water) as directed by your health care provider. Slowly move to a bland diet as tolerated. SEEK MEDICAL CARE IF:  You have unexplained abdominal pain.  You have abdominal pain associated with nausea or diarrhea.  You have pain when you urinate or have a bowel movement.  You experience abdominal pain that wakes you in the night.  You have abdominal pain that is worsened or improved by eating food.  You have abdominal pain that is worsened with eating fatty foods.  You have a fever. SEEK IMMEDIATE MEDICAL CARE IF:  Your pain does not go away within 2 hours.  You keep throwing up (vomiting).  Your pain is felt only in portions of the abdomen, such as the right side or the left lower portion of the abdomen.  You pass bloody or black tarry stools. MAKE SURE YOU:  Understand these instructions.  Will watch your condition.  Will get help right away if you are not doing well or get worse.   This information is not intended to replace advice given to you by your health care provider. Make sure you discuss  any questions you have with your health care provider.   Document Released: 04/04/2005 Document Revised: 03/16/2015 Document Reviewed: 03/04/2013 Elsevier Interactive Patient Education 2016 Else[employer] Inc.   Type 2 Diabetes Mellitus, Adult Type 2 diabetes mellitus, often simply referred to as type 2 diabetes, is a long-lasting (chronic) disease. In type 2 diabetes, the pancreas does not make enough insulin (a hormone), the cells are less responsive to the insulin that is made (insulin resistance), or both. Normally, insulin moves sugars from food into the tissue cells. The tissue cells use the sugars for energy. The lack of insulin or the lack of normal response to insulin causes excess sugars to build up in the blood instead of going into the tissue cells. As a result, high blood sugar (hyperglycemia) develops. The effect of high sugar (glucose) levels can cause many complications. Type 2 diabetes was also previously called adult-onset diabetes, but it can occur at any age.  RISK FACTORS  A person is predisposed to developing type 2 diabetes if someone in the family has the disease and also has one or more of the following primary risk factors:  Weight gain, or being overweight or obese.  An inactive lifestyle.  A history of consistently eating high-calorie foods. Maintaining a normal weight and regular physical activity can reduce the chance of developing type 2 diabetes. SYMPTOMS  A person with type 2 diabetes may not show symptoms initially. The symptoms of type 2 diabetes appear slowly. The symptoms include:  Increased  thirst (polydipsia).  Increased urination (polyuria).  Increased urination during the night (nocturia).  Sudden or unexplained weight changes.  Frequent, recurring infections.  Tiredness (fatigue).  Weakness.  Vision changes, such as blurred vision.  Fruity smell to your breath.  Abdominal pain.  Nausea or vomiting.  Cuts or bruises which are slow to  heal.  Tingling or numbness in the hands or feet.  An open skin wound (ulcer). DIAGNOSIS Type 2 diabetes is frequently not diagnosed until complications of diabetes are present. Type 2 diabetes is diagnosed when symptoms or complications are present and when blood glucose levels are increased. Your blood glucose level may be checked by one or more of the following blood tests:  A fasting blood glucose test. You will not be allowed to eat for at least 8 hours before a blood sample is taken.  A random blood glucose test. Your blood glucose is checked at any time of the day regardless of when you ate.  A hemoglobin A1c blood glucose test. A hemoglobin A1c test provides information about blood glucose control over the previous 3 months.  An oral glucose tolerance test (OGTT). Your blood glucose is measured after you have not eaten (fasted) for 2 hours and then after you drink a glucose-containing beverage. TREATMENT   You may need to take insulin or diabetes medicine daily to keep blood glucose levels in the desired range.  If you use insulin, you may need to adjust the dosage depending on the carbohydrates that you eat with each meal or snack.  Lifestyle changes are recommended as part of your treatment. These may include:  Following an individualized diet plan developed by a nutritionist or dietitian.  Exercising daily. Your health care providers will set individualized treatment goals for you based on your age, your medicines, how long you have had diabetes, and any other medical conditions you have. Generally, the goal of treatment is to maintain the following blood glucose levels:  Before meals (preprandial): 80-130 mg/dL.  After meals (postprandial): below 180 mg/dL.  A1c: less than 6.5-7%. HOME CARE INSTRUCTIONS   Have your hemoglobin A1c level checked twice a year.  Perform daily blood glucose monitoring as directed by your health care provider.  Monitor urine ketones when  you are ill and as directed by your health care provider.  Take your diabetes medicine or insulin as directed by your health care provider to maintain your blood glucose levels in the desired range.  Never run out of diabetes medicine or insulin. It is needed every day.  If you are using insulin, you may need to adjust the amount of insulin given based on your intake of carbohydrates. Carbohydrates can raise blood glucose levels but need to be included in your diet. Carbohydrates provide vitamins, minerals, and fiber which are an essential part of a healthy diet. Carbohydrates are found in fruits, vegetables, whole grains, dairy products, legumes, and foods containing added sugars.  Eat healthy foods. You should make an appointment to see a registered dietitian to help you create an eating plan that is right for you.  Lose weight if you are overweight.  Carry a medical alert card or wear your medical alert jewelry.  Carry a 15-gram carbohydrate snack with you at all times to treat low blood glucose (hypoglycemia). Some examples of 15-gram carbohydrate snacks include:  Glucose tablets, 3 or 4.  Glucose gel, 15-gram tube.  Raisins, 2 tablespoons (24 grams).  Jelly beans, 6.  Animal crackers, 8.  Regular pop, 4  ounces (120 mL).  Gummy treats, 9.  Recognize hypoglycemia. Hypoglycemia occurs with blood glucose levels of 70 mg/dL and below. The risk for hypoglycemia increases when fasting or skipping meals, during or after intense exercise, and during sleep. Hypoglycemia symptoms can include:  Tremors or shakes.  Decreased ability to concentrate.  Sweating.  Increased heart rate.  Headache.  Dry mouth.  Hunger.  Irritability.  Anxiety.  Restless sleep.  Altered speech or coordination.  Confusion.  Treat hypoglycemia promptly. If you are alert and able to safely swallow, follow the 15:15 rule:  Take 15-20 grams of rapid-acting glucose or carbohydrate. Rapid-acting  options include glucose gel, glucose tablets, or 4 ounces (120 mL) of fruit juice, regular soda, or low-fat milk.  Check your blood glucose level 15 minutes after taking the glucose.  Take 15-20 grams more of glucose if the repeat blood glucose level is still 70 mg/dL or below.  Eat a meal or snack within 1 hour once blood glucose levels return to normal.  Be alert to feeling very thirsty and urinating more frequently than usual, which are early signs of hyperglycemia. An early awareness of hyperglycemia allows for prompt treatment. Treat hyperglycemia as directed by your health care provider.  Engage in at least 150 minutes of moderate-intensity physical activity a week, spread over at least 3 days of the week or as directed by your health care provider. In addition, you should engage in resistance exercise at least 2 times a week or as directed by your health care provider. Try to spend no more than 90 minutes at one time inactive.  Adjust your medicine and food intake as needed if you start a new exercise or sport.  Follow your sick-day plan anytime you are unable to eat or drink as usual.  Do not use any tobacco products including cigarettes, chewing tobacco, or electronic cigarettes. If you need help quitting, ask your health care provider.  Limit alcohol intake to no more than 1 drink per day for nonpregnant women and 2 drinks per day for men. You should drink alcohol only when you are also eating food. Talk with your health care provider whether alcohol is safe for you. Tell your health care provider if you drink alcohol several times a week.  Keep all follow-up visits as directed by your health care provider. This is important.  Schedule an eye exam soon after the diagnosis of type 2 diabetes and then annually.  Perform daily skin and foot care. Examine your skin and feet daily for cuts, bruises, redness, nail problems, bleeding, blisters, or sores. A foot exam by a health care  provider should be done annually.  Brush your teeth and gums at least twice a day and floss at least once a day. Follow up with your dentist regularly.  Share your diabetes management plan with your workplace or school.  Keep your immunizations up to date. It is recommended that you receive a flu (influenza) vaccine every year. It is also recommended that you receive a pneumonia (pneumococcal) vaccine. If you are 14 years of age or older and have never received a pneumonia vaccine, this vaccine may be given as a series of two separate shots. Ask your health care provider which additional vaccines may be recommended.  Learn to manage stress.  Obtain ongoing diabetes education and support as needed.  Participate in or seek rehabilitation as needed to maintain or improve independence and quality of life. Request a physical or occupational therapy referral if you are  having foot or hand numbness, or difficulties with grooming, dressing, eating, or physical activity. SEEK MEDICAL CARE IF:   You are unable to eat food or drink fluids for more than 6 hours.  You have nausea and vomiting for more than 6 hours.  Your blood glucose level is over 240 mg/dL.  There is a change in mental status.  You develop an additional serious illness.  You have diarrhea for more than 6 hours.  You have been sick or have had a fever for a couple of days and are not getting better.  You have pain during any physical activity.  SEEK IMMEDIATE MEDICAL CARE IF:  You have difficulty breathing.  You have moderate to large ketone levels.   This information is not intended to replace advice given to you by your health care provider. Make sure you discuss any questions you have with your health care provider.   Document Released: 06/25/2005 Document Revised: 03/16/2015 Document Reviewed: 01/22/2012 Elsevier Interactive Patient Education [employer]2016 Else[employer] Inc.

## 2015-07-31 ENCOUNTER — Emergency Department: Payer: Self-pay

## 2015-07-31 ENCOUNTER — Emergency Department
Admission: EM | Admit: 2015-07-31 | Discharge: 2015-07-31 | Disposition: A | Discharge status: Home / Self Care | Payer: Self-pay | Attending: Emergency Medicine | Admitting: Emergency Medicine

## 2015-07-31 DIAGNOSIS — E119 Type 2 diabetes mellitus without complications: Secondary | ICD-10-CM | POA: Insufficient documentation

## 2015-07-31 DIAGNOSIS — Z791 Long term (current) use of non-steroidal anti-inflammatories (NSAID): Secondary | ICD-10-CM | POA: Insufficient documentation

## 2015-07-31 DIAGNOSIS — N939 Abnormal uterine and vaginal bleeding, unspecified: Secondary | ICD-10-CM | POA: Insufficient documentation

## 2015-07-31 DIAGNOSIS — Z7984 Long term (current) use of oral hypoglycemic drugs: Secondary | ICD-10-CM | POA: Insufficient documentation

## 2015-07-31 DIAGNOSIS — Z3202 Encounter for pregnancy test, result negative: Secondary | ICD-10-CM | POA: Insufficient documentation

## 2015-07-31 DIAGNOSIS — R102 Pelvic and perineal pain: Secondary | ICD-10-CM

## 2015-07-31 DIAGNOSIS — Z880 Allergy status to penicillin: Secondary | ICD-10-CM | POA: Insufficient documentation

## 2015-07-31 DIAGNOSIS — N83202 Unspecified ovarian cyst, left side: Secondary | ICD-10-CM | POA: Insufficient documentation

## 2015-07-31 LAB — COMPREHENSIVE METABOLIC PANEL
ALK PHOS: 59 U/L (ref 38–126)
ALT: 18 U/L (ref 14–54)
ANION GAP: 8 (ref 5–15)
AST: 16 U/L (ref 15–41)
Albumin: 4 g/dL (ref 3.5–5.0)
BUN: 6 mg/dL (ref 6–20)
CO2: 24 mmol/L (ref 22–32)
Calcium: 9.1 mg/dL (ref 8.9–10.3)
Chloride: 100 mmol/L — ABNORMAL LOW (ref 101–111)
Creatinine, Ser: 0.44 mg/dL (ref 0.44–1.00)
GFR calc non Af Amer: >60 mL/min (ref >60)
Glucose, Bld: 489 mg/dL — ABNORMAL HIGH (ref 65–99)
POTASSIUM: 4 mmol/L (ref 3.5–5.1)
SODIUM: 132 mmol/L — AB (ref 135–145)
Total Bilirubin: 0.7 mg/dL (ref 0.3–1.2)
Total Protein: 7.6 g/dL (ref 6.5–8.1)

## 2015-07-31 LAB — URINALYSIS COMPLETE WITH MICROSCOPIC (ARMC ONLY)
Bilirubin Urine: NEGATIVE
Glucose, UA: >500 mg/dL — AB
Ketones, ur: NEGATIVE mg/dL
Nitrite: NEGATIVE
PROTEIN: NEGATIVE mg/dL
SPECIFIC GRAVITY, URINE: 1.037 — AB (ref 1.005–1.030)
pH: 6 (ref 5.0–8.0)

## 2015-07-31 LAB — WET PREP, GENITAL
Sperm: NONE SEEN
Trich, Wet Prep: NONE SEEN
Yeast Wet Prep HPF POC: NONE SEEN

## 2015-07-31 LAB — LIPASE, BLOOD: LIPASE: 23 U/L (ref 11–51)

## 2015-07-31 LAB — CHLAMYDIA/NGC RT PCR (ARMC ONLY)
CHLAMYDIA TR: DETECTED — AB
N GONORRHOEAE: NOT DETECTED

## 2015-07-31 LAB — CBC
HEMATOCRIT: 43.7 % (ref 35.0–47.0)
HEMOGLOBIN: 13.9 g/dL (ref 12.0–16.0)
MCH: 25.8 pg — AB (ref 26.0–34.0)
MCHC: 31.7 g/dL — ABNORMAL LOW (ref 32.0–36.0)
MCV: 81.3 fL (ref 80.0–100.0)
Platelets: 227 10*3/uL (ref 150–440)
RBC: 5.37 MIL/uL — AB (ref 3.80–5.20)
RDW: 13.6 % (ref 11.5–14.5)
WBC: 7.1 10*3/uL (ref 3.6–11.0)

## 2015-07-31 LAB — POCT PREGNANCY, URINE: Preg Test, Ur: NEGATIVE

## 2015-07-31 MED ORDER — DOXYCYCLINE HYCLATE 100 MG PO TABS: 100.0000 mg | ORAL_TABLET | Freq: Two times a day (BID) | ORAL | Status: DC

## 2015-07-31 MED ORDER — METRONIDAZOLE 500 MG PO TABS
500.0000 mg | ORAL_TABLET | Freq: Two times a day (BID) | ORAL | Status: DC
Start: 2015-07-31 — End: 2019-02-22

## 2015-07-31 MED ORDER — ONDANSETRON 4 MG PO TBDP
ORAL_TABLET | ORAL | Status: AC
  Administered 2015-07-31: 4 mg via ORAL
  Filled 2015-07-31: qty 1

## 2015-07-31 MED ORDER — CEFTRIAXONE SODIUM 500 MG IJ SOLR
500.0000 mg | Freq: Once | INTRAMUSCULAR | Status: AC
  Administered 2015-07-31: 500 mg via INTRAMUSCULAR

## 2015-07-31 MED ORDER — DOXYCYCLINE HYCLATE 100 MG PO TABS
ORAL_TABLET | ORAL | Status: AC
  Filled 2015-07-31: qty 1

## 2015-07-31 MED ORDER — CEFTRIAXONE SODIUM 250 MG IJ SOLR
INTRAMUSCULAR | Status: AC
  Filled 2015-07-31: qty 500

## 2015-07-31 MED ORDER — DOXYCYCLINE HYCLATE 100 MG PO TABS
100.0000 mg | ORAL_TABLET | Freq: Once | ORAL | Status: AC
  Administered 2015-07-31: 100 mg via ORAL

## 2015-07-31 MED ORDER — OXYCODONE-ACETAMINOPHEN 5-325 MG PO TABS
1.0000 | ORAL_TABLET | Freq: Once | ORAL | Status: AC
  Administered 2015-07-31: 1 via ORAL
  Filled 2015-07-31: qty 1

## 2015-07-31 MED ORDER — ONDANSETRON 4 MG PO TBDP
4.0000 mg | ORAL_TABLET | Freq: Once | ORAL | Status: AC
  Administered 2015-07-31: 4 mg via ORAL

## 2015-07-31 NOTE — ED Notes (Signed)
Patient transported to Ultrasound 

## 2015-07-31 NOTE — Discharge Instructions (Signed)
You have a cyst on the left ovary. You also have clue cells noted on the "wet prep". You treated with ceftriaxone and doxycycline. Continue take doxycycline and metronidazole. Follow-up at [facility]. Return to the emergency department if your pain is worse or if you have other urgent concerns.  Pelvic Pain, Female Female pelvic pain can be caused by many different things and start from a variety of places. Pelvic pain refers to pain that is located in the lower half of the abdomen and between your hips. The pain may occur over a short period of time (acute) or may be reoccurring (chronic). The cause of pelvic pain may be related to disorders affecting the female reproductive organs (gynecologic), but it may also be related to the bladder, kidney stones, an intestinal complication, or muscle or skeletal problems. Getting help right away for pelvic pain is important, especially if there has been severe, sharp, or a sudden onset of unusual pain. It is also important to get help right away because some types of pelvic pain can be life threatening.  CAUSES  Below are only some of the causes of pelvic pain. The causes of pelvic pain can be in one of several categories.   Gynecologic.  Pelvic inflammatory disease.  Sexually transmitted infection.  Ovarian cyst or a twisted ovarian ligament (ovarian torsion).  Uterine lining that grows outside the uterus (endometriosis).  Fibroids, cysts, or tumors.  Ovulation.  Pregnancy.  Pregnancy that occurs outside the uterus (ectopic pregnancy).  Miscarriage.  Labor.  Abruption of the placenta or ruptured uterus.  Infection.  Uterine infection (endometritis).  Bladder infection.  Diverticulitis.  Miscarriage related to a uterine infection (septic abortion).  Bladder.  Inflammation of the bladder (cystitis).  Kidney stone(s).  Gastrointestinal.  Constipation.  Diverticulitis.  Neurologic.  Trauma.  Feeling pelvic pain because  of mental or emotional causes (psychosomatic).  Cancers of the bowel or pelvis. EVALUATION  Your caregiver will want to take a careful history of your concerns. This includes recent changes in your health, a careful gynecologic history of your periods (menses), and a sexual history. Obtaining your family history and medical history is also important. Your caregiver may suggest a pelvic exam. A pelvic exam will help identify the location and severity of the pain. It also helps in the evaluation of which organ system may be involved. In order to identify the cause of the pelvic pain and be properly treated, your caregiver may order tests. These tests may include:   A pregnancy test.  Pelvic ultrasonography.  An X-ray exam of the abdomen.  A urinalysis or evaluation of vaginal discharge.  Blood tests. HOME CARE INSTRUCTIONS   Only take over-the-counter or prescription medicines for pain, discomfort, or fever as directed by your caregiver.   Rest as directed by your caregiver.   Eat a balanced diet.   Drink enough fluids to make your urine clear or pale yellow, or as directed.   Avoid sexual intercourse if it causes pain.   Apply warm or cold compresses to the lower abdomen depending on which one helps the pain.   Avoid stressful situations.   Keep a journal of your pelvic pain. Write down when it started, where the pain is located, and if there are things that seem to be associated with the pain, such as food or your menstrual cycle.  Follow up with your caregiver as directed.  SEEK MEDICAL CARE IF:  Your medicine does not help your pain.  You have abnormal  vaginal discharge. SEEK IMMEDIATE MEDICAL CARE IF:   You have heavy bleeding from the vagina.   Your pelvic pain increases.   You feel light-headed or faint.   You have chills.   You have pain with urination or blood in your urine.   You have uncontrolled diarrhea or vomiting.   You have a fever or  persistent symptoms for more than 3 days.  You have a fever and your symptoms suddenly get worse.   You are being physically or sexually abused.   This information is not intended to replace advice given to you by your health care provider. Make sure you discuss any questions you have with your health care provider.   Document Released: 05/22/2004 Document Revised: 03/16/2015 Document Reviewed: 10/15/2011 Elsevier Interactive Patient Education [employer]

## 2015-07-31 NOTE — ED Notes (Signed)
Pt remains in US

## 2015-07-31 NOTE — ED Provider Notes (Addendum)
[facility] Emergency Department Provider Note  ____________________________________________  Time seen: 1418  I have reviewed the triage vital signs and the nursing notes.  History by:  Patient  HISTORY  Chief Complaint Pelvic pain Vaginal bleeding    HPI Chelsea Vazquez is a 25 y.o. female who began to have lower abdominal/pelvic pain yesterday. She reports that she noted some mild spotting yesterday as well as. She then passed a blood clot when she went to urinate and she has had ongoing spotting since.   The discomfort is in her lower abdomen/pelvic area, equal bilaterally.  She denies any nausea, vomiting, diarrhea, or other gastrointestinal symptoms. She denies any vaginal discharge.  Patient did have a tubal ligation in April 2016.   Past medical history: Diabetes  There are no active problems to display for this patient.   Past surgical history: Bilateral tubal ligation  Current Outpatient Rx  Name  Route  Sig  Dispense  Refill  . doxycycline (VIBRA-TABS) 100 MG tablet   Oral   Take 1 tablet (100 mg total) by mouth 2 (two) times daily.   14 tablet   0   . metFORMIN (GLUCOPHAGE) 500 MG tablet   Oral   Take 1 tablet (500 mg total) by mouth 2 (two) times daily with a meal.   60 tablet   0   . metroNIDAZOLE (FLAGYL) 500 MG tablet   Oral   Take 1 tablet (500 mg total) by mouth 2 (two) times daily.   14 tablet   0   . naproxen (NAPROSYN) 500 MG tablet   Oral   Take 1 tablet (500 mg total) by mouth 2 (two) times daily with a meal.   20 tablet   0     Allergies Amoxicillin  No family history on file.  Social History Social History  Substance Use Topics  . Smoking status: Never Smoker   . Smokeless tobacco: Not on file  . Alcohol Use: No    Review of Systems  Constitutional: Negative for fever/chills. ENT: Negative for congestion. Cardiovascular: Negative for chest pain. Respiratory: Negative for  cough. Gastrointestinal: Negative for abdominal pain, vomiting and diarrhea. Genitourinary: Positive for dysuria, pelvic pain, vaginal bleeding. See history of present illness Musculoskeletal: No back pain. Skin: Negative for rash. Neurological: Negative for headache or focal weakness   10-point ROS otherwise negative.  ____________________________________________   PHYSICAL EXAM:  VITAL SIGNS: ED Triage Vitals  Enc Vitals Group     BP 07/31/15 1311 105/72 mmHg     Pulse Rate 07/31/15 1311 104     Resp 07/31/15 1311 16     Temp 07/31/15 1311 97.7 F (36.5 C)     Temp Source 07/31/15 1311 Oral     SpO2 07/31/15 1311 98 %     Weight 07/31/15 1311 105 lb (47.628 kg)     Height 07/31/15 1311 5' (1.524 m)     Head Cir --      Peak Flow --      Pain Score 07/31/15 1315 10     Pain Loc --      Pain Edu? --      Excl. in GC? --     Constitutional:  Alert and oriented. Well appearing and in no distress. ENT   Head: Normocephalic and atraumatic.   Nose: No congestion/rhinnorhea.  Cardiovascular: Normal rate, regular rhythm, no murmur noted Respiratory:  Normal respiratory effort, no tachypnea.    Breath sounds are clear and equal  bilaterally.  Gastrointestinal: Soft, no distention. Notable tenderness in the pelvic area bilaterally and centrally. Pelvic exam: Normal external female genitalia. No bleeding. There appears to be a moderate amount of physiologic discharge, however this is following ultrasound and it may appear more prevalent due to lubricant used. She has mild discomfort on bimanual exam but no significant cervical motion tenderness. She does have diffuse adnexal tenderness. Back: No muscle spasm, no tenderness, no CVA tenderness. Musculoskeletal: No deformity noted. Nontender with normal range of motion in all extremities.  No noted edema. Neurologic:  Communicative. Normal appearing spontaneous movement in all 4 extremities. No gross focal neurologic deficits  are appreciated.  Skin:  Skin is warm, dry. No rash noted. Psychiatric: Mood and affect are normal. Speech and behavior are normal.  ____________________________________________    LABS (pertinent positives/negatives)  Labs Reviewed  WET PREP, GENITAL - Abnormal; Notable for the following:    Clue Cells Wet Prep HPF POC PRESENT (*)    WBC, Wet Prep HPF POC FEW (*)    All other components within normal limits  COMPREHENSIVE METABOLIC PANEL - Abnormal; Notable for the following:    Sodium 132 (*)    Chloride 100 (*)    Glucose, Bld 489 (*)    All other components within normal limits  CBC - Abnormal; Notable for the following:    RBC 5.37 (*)    MCH 25.8 (*)    MCHC 31.7 (*)    All other components within normal limits  URINALYSIS COMPLETEWITH MICROSCOPIC ([facility] ONLY) - Abnormal; Notable for the following:    Color, Urine STRAW (*)    APPearance CLEAR (*)    Glucose, UA >500 (*)    Specific Gravity, Urine 1.037 (*)    Hgb urine dipstick 3+ (*)    Leukocytes, UA TRACE (*)    Bacteria, UA RARE (*)    Squamous Epithelial / LPF 0-5 (*)    All other components within normal limits  CHLAMYDIA/NGC RT PCR ([facility] ONLY)  LIPASE, BLOOD  POC URINE PREG, ED  POCT PREGNANCY, URINE     ____________________________________________    RADIOLOGY  Ultrasound, pelvic: IMPRESSION: Essentially physiologic ultrasound appearance of the pelvis and no evidence of ovarian torsion.  3.2 cm simple left adnexal cyst persists since November CT Abdomen and Pelvis , but follow-up is not needed for simple cysts up to 5 cm in reproductive age patients. ____________________________________________   PROCEDURES    ____________________________________________   INITIAL IMPRESSION / ASSESSMENT AND PLAN / ED COURSE  Pertinent labs & imaging results that were available during my care of the patient were reviewed by me and considered in my medical decision making (see chart for  details).    ____________________________________________   FINAL CLINICAL IMPRESSION(S) / ED DIAGNOSES  Final diagnoses:  Ovarian cyst, left   pelvic pain   Darien Ramus, MD 07/31/15 1734

## 2015-07-31 NOTE — ED Notes (Signed)
Pt reports lower abd pain that started yesterday. Pt vaginal bleeding that started yesterday. Denies fever states she had her tubes tied April 2016.

## 2015-08-01 ENCOUNTER — Telehealth: Payer: Self-pay | Admitting: Emergency Medicine

## 2015-08-01 NOTE — ED Notes (Signed)
Called patient to inform of positive chlamydia test and need for partner tx.  Another person took my number and will have her call me back.  Patient was treated already.

## 2015-09-07 ENCOUNTER — Emergency Department
Admission: EM | Admit: 2015-09-07 | Discharge: 2015-09-07 | Disposition: A | Discharge status: Home / Self Care | Payer: Self-pay | Attending: Emergency Medicine | Admitting: Emergency Medicine

## 2015-09-07 DIAGNOSIS — Z791 Long term (current) use of non-steroidal anti-inflammatories (NSAID): Secondary | ICD-10-CM | POA: Insufficient documentation

## 2015-09-07 DIAGNOSIS — G43909 Migraine, unspecified, not intractable, without status migrainosus: Secondary | ICD-10-CM | POA: Insufficient documentation

## 2015-09-07 DIAGNOSIS — E1365 Other specified diabetes mellitus with hyperglycemia: Secondary | ICD-10-CM | POA: Insufficient documentation

## 2015-09-07 DIAGNOSIS — Z3202 Encounter for pregnancy test, result negative: Secondary | ICD-10-CM | POA: Insufficient documentation

## 2015-09-07 DIAGNOSIS — Z792 Long term (current) use of antibiotics: Secondary | ICD-10-CM | POA: Insufficient documentation

## 2015-09-07 DIAGNOSIS — E138 Other specified diabetes mellitus with unspecified complications: Secondary | ICD-10-CM

## 2015-09-07 DIAGNOSIS — Z880 Allergy status to penicillin: Secondary | ICD-10-CM | POA: Insufficient documentation

## 2015-09-07 DIAGNOSIS — R739 Hyperglycemia, unspecified: Secondary | ICD-10-CM

## 2015-09-07 DIAGNOSIS — Z7984 Long term (current) use of oral hypoglycemic drugs: Secondary | ICD-10-CM | POA: Insufficient documentation

## 2015-09-07 LAB — BASIC METABOLIC PANEL
ANION GAP: 11 (ref 5–15)
BUN: 13 mg/dL (ref 6–20)
CALCIUM: 8.8 mg/dL — AB (ref 8.9–10.3)
CO2: 21 mmol/L — AB (ref 22–32)
CREATININE: 0.45 mg/dL (ref 0.44–1.00)
Chloride: 99 mmol/L — ABNORMAL LOW (ref 101–111)
Glucose, Bld: 395 mg/dL — ABNORMAL HIGH (ref 65–99)
Potassium: 3.6 mmol/L (ref 3.5–5.1)
SODIUM: 131 mmol/L — AB (ref 135–145)

## 2015-09-07 LAB — CBC WITH DIFFERENTIAL/PLATELET
Basophils Absolute: 0.1 10*3/uL (ref 0–0.1)
Basophils Relative: 1 %
Eosinophils Absolute: 0.1 10*3/uL (ref 0–0.7)
Eosinophils Relative: 1 %
HCT: 39.7 % (ref 35.0–47.0)
Hemoglobin: 13.1 g/dL (ref 12.0–16.0)
LYMPHS ABS: 3.5 10*3/uL (ref 1.0–3.6)
LYMPHS PCT: 36 %
MCH: 26.6 pg (ref 26.0–34.0)
MCHC: 33 g/dL (ref 32.0–36.0)
MCV: 80.4 fL (ref 80.0–100.0)
Monocytes Absolute: 0.8 10*3/uL (ref 0.2–0.9)
Monocytes Relative: 8 %
NEUTROS ABS: 5.2 10*3/uL (ref 1.4–6.5)
NEUTROS PCT: 54 %
PLATELETS: 217 10*3/uL (ref 150–440)
RBC: 4.94 MIL/uL (ref 3.80–5.20)
RDW: 13.2 % (ref 11.5–14.5)
WBC: 9.6 10*3/uL (ref 3.6–11.0)

## 2015-09-07 LAB — URINALYSIS COMPLETE WITH MICROSCOPIC (ARMC ONLY)
Bilirubin Urine: NEGATIVE
Hgb urine dipstick: NEGATIVE
Leukocytes, UA: NEGATIVE
NITRITE: NEGATIVE
PROTEIN: NEGATIVE mg/dL
SPECIFIC GRAVITY, URINE: 1.037 — AB (ref 1.005–1.030)
pH: 6 (ref 5.0–8.0)

## 2015-09-07 LAB — GLUCOSE, CAPILLARY
Glucose-Capillary: 399 mg/dL — ABNORMAL HIGH (ref 65–99)
Glucose-Capillary: 246 mg/dL — ABNORMAL HIGH (ref 65–99)

## 2015-09-07 LAB — POCT PREGNANCY, URINE: PREG TEST UR: NEGATIVE

## 2015-09-07 MED ORDER — PROCHLORPERAZINE EDISYLATE 5 MG/ML IJ SOLN
INTRAMUSCULAR | Status: AC
  Filled 2015-09-07: qty 2

## 2015-09-07 MED ORDER — METFORMIN HCL 500 MG PO TABS: 500.0000 mg | ORAL_TABLET | Freq: Two times a day (BID) | ORAL | Status: DC

## 2015-09-07 MED ORDER — PROCHLORPERAZINE EDISYLATE 5 MG/ML IJ SOLN: 10.0000 mg | Freq: Once | INTRAMUSCULAR | Status: DC

## 2015-09-07 MED ORDER — METFORMIN HCL 500 MG PO TABS
ORAL_TABLET | ORAL | Status: AC
Start: 2015-09-07 — End: 2015-09-07
  Administered 2015-09-07: 500 mg via ORAL
  Filled 2015-09-07: qty 1

## 2015-09-07 MED ORDER — KETOROLAC TROMETHAMINE 30 MG/ML IJ SOLN
INTRAMUSCULAR | Status: AC
  Filled 2015-09-07: qty 1

## 2015-09-07 MED ORDER — KETOROLAC TROMETHAMINE 30 MG/ML IJ SOLN
30.0000 mg | Freq: Once | INTRAMUSCULAR | Status: AC
  Administered 2015-09-07: 30 mg via INTRAVENOUS

## 2015-09-07 MED ORDER — KETOROLAC TROMETHAMINE 30 MG/ML IJ SOLN: 30.0000 mg | Freq: Once | INTRAMUSCULAR | Status: DC

## 2015-09-07 MED ORDER — PROCHLORPERAZINE EDISYLATE 5 MG/ML IJ SOLN
10.0000 mg | Freq: Once | INTRAMUSCULAR | Status: AC
  Administered 2015-09-07: 10 mg via INTRAVENOUS
  Filled 2015-09-07: qty 2

## 2015-09-07 MED ORDER — METFORMIN HCL 500 MG PO TABS
500.0000 mg | ORAL_TABLET | Freq: Once | ORAL | Status: AC
  Administered 2015-09-07: 500 mg via ORAL
  Filled 2015-09-07: qty 1

## 2015-09-07 MED ORDER — SODIUM CHLORIDE 0.9 % IV BOLUS (SEPSIS)
1000.0000 mL | Freq: Once | INTRAVENOUS | Status: AC
Start: 2015-09-07 — End: 2015-09-07
  Administered 2015-09-07: 1000 mL via INTRAVENOUS

## 2015-09-07 MED ORDER — SODIUM CHLORIDE 0.9 % IV BOLUS (SEPSIS)
1000.0000 mL | Freq: Once | INTRAVENOUS | Status: AC
  Administered 2015-09-07: 1000 mL via INTRAVENOUS

## 2015-09-07 NOTE — ED Provider Notes (Signed)
[facility] Emergency Department Provider Note  ____________________________________________  Time seen: Approximately 8:53 PM  I have reviewed the triage vital signs and the nursing notes.   HISTORY  Chief Complaint Headache    HPI Chelsea Vazquez is a 25 y.o. female without any chronic medical conditions who is presenting today with a headache over the past 2 days. She says the headache is right-sided and started up or a mild but has progressed to a 10 out of 10. She says that it is a tight throbbing sensation which occasionally radiates throughout her whole head. She says that she has bothered by light and has experienced nausea yesterday but none today. She has taken multiple over-the-counter medicines at home without any success. Denies any family history of aneurysm or brain cancer. Says that she does not have a history of migraine. Says that she has not had any excessive caffeine. No increased stress or other factors that would've instigated this headache.   History reviewed. No pertinent past medical history.  There are no active problems to display for this patient.   History reviewed. No pertinent past surgical history.  Current Outpatient Rx  Name  Route  Sig  Dispense  Refill  . doxycycline (VIBRA-TABS) 100 MG tablet   Oral   Take 1 tablet (100 mg total) by mouth 2 (two) times daily.   14 tablet   0   . metFORMIN (GLUCOPHAGE) 500 MG tablet   Oral   Take 1 tablet (500 mg total) by mouth 2 (two) times daily with a meal.   60 tablet   0   . metroNIDAZOLE (FLAGYL) 500 MG tablet   Oral   Take 1 tablet (500 mg total) by mouth 2 (two) times daily.   14 tablet   0   . naproxen (NAPROSYN) 500 MG tablet   Oral   Take 1 tablet (500 mg total) by mouth 2 (two) times daily with a meal.   20 tablet   0     Allergies Amoxicillin  No family history on file.  Social History Social History  Substance Use Topics  . Smoking status: Never  Smoker   . Smokeless tobacco: None  . Alcohol Use: No    Review of Systems Constitutional: No fever/chills Eyes: No visual changes. ENT: No sore throat. Cardiovascular: Denies chest pain. Respiratory: Denies shortness of breath. Gastrointestinal: No abdominal pain.  no vomiting.  No diarrhea.  No constipation. Genitourinary: Negative for dysuria. Musculoskeletal: Negative for back pain. Skin: Negative for rash. Neurological: Negative for focal weakness or numbness.  10-point ROS otherwise negative.  ____________________________________________   PHYSICAL EXAM:  VITAL SIGNS: ED Triage Vitals  Enc Vitals Group     BP 09/07/15 2011 107/75 mmHg     Pulse Rate 09/07/15 2010 90     Resp --      Temp 09/07/15 2010 98.1 F (36.7 C)     Temp Source 09/07/15 2010 Oral     SpO2 09/07/15 2011 99 %     Weight 09/07/15 2011 105 lb (47.628 kg)     Height --      Head Cir --      Peak Flow --      Pain Score 09/07/15 2036 10     Pain Loc --      Pain Edu? --      Excl. in GC? --     Constitutional: Alert and oriented. Well appearing and in no acute distress. Eyes: Conjunctivae  are normal. PERRL. EOMI. Head: Atraumatic. Nose: No congestion/rhinnorhea. Mouth/Throat: Mucous membranes are moist.   Neck: No stridor.   Cardiovascular: Normal rate, regular rhythm. Grossly normal heart sounds.  Good peripheral circulation. Respiratory: Normal respiratory effort.  No retractions. Lungs CTAB. Gastrointestinal: Soft and nontender. No distention.  No CVA tenderness. Musculoskeletal: No lower extremity tenderness nor edema.  No joint effusions. Neurologic:  Normal speech and language. No gross focal neurologic deficits are appreciated.  Skin:  Skin is warm, dry and intact. No rash noted. Psychiatric: Mood and affect are normal. Speech and behavior are normal.  ____________________________________________   LABS (all labs ordered are listed, but only abnormal results are  displayed)  Labs Reviewed - No data to display ____________________________________________  EKG   ____________________________________________  RADIOLOGY   ____________________________________________   PROCEDURES    ____________________________________________   INITIAL IMPRESSION / ASSESSMENT AND PLAN / ED COURSE  Pertinent labs & imaging results that were available during my care of the patient were reviewed by me and considered in my medical decision making (see chart for details).  Upon further review of the patient's chart she was found to have hyperglycemia in the past. Her sugar was taken at the bedside and was found to be 399. She says that she had diabetes when she was pregnant but then her sugar returned to normal at her follow-up appointment. She was subsequently seen in the emergency department and discharged with metformin which she took for about a month. However, she is not on the metformin any longer.  ----------------------------------------- 10:45 PM on 09/07/2015 -----------------------------------------  After fluids as well as Toradol and Compazine the patient's headache is gone. Her sugar is now in the 240s. We will give her dose of metformin before she leaves and then I will write her for another prescription for 500 mg twice a day. She has a follow-up appointment this Friday at [facility]. She does have a trace amount of ketones in her urine however her bicarbonate is 21 and she does not have an elevated anion gap. Does not appear to be in DKA. Will be discharged home. Likely migraine type headache which was induced by her high blood sugar. She says she has been off her metformin for about a month this point. ____________________________________________   FINAL CLINICAL IMPRESSION(S) / ED DIAGNOSES  Diabetes. Hyperglycemia. Migraine headache.    Myrna Blazer, MD 09/07/15 [phone]

## 2015-09-07 NOTE — Discharge Instructions (Signed)
Diabetes and Exercise Exercising regularly is important. It is not just about losing weight. It has many health benefits, such as:  Improving your overall fitness, flexibility, and endurance.  Increasing your bone density.  Helping with weight control.  Decreasing your body fat.  Increasing your muscle strength.  Reducing stress and tension.  Improving your overall health. People with diabetes who exercise gain additional benefits because exercise:  Reduces appetite.  Improves the body's use of blood sugar (glucose).  Helps lower or control blood glucose.  Decreases blood pressure.  Helps control blood lipids (such as cholesterol and triglycerides).  Improves the body's use of the hormone insulin by:  Increasing the body's insulin sensitivity.  Reducing the body's insulin needs.  Decreases the risk for heart disease because exercising:  Lowers cholesterol and triglycerides levels.  Increases the levels of good cholesterol (such as high-density lipoproteins [HDL]) in the body.  Lowers blood glucose levels. YOUR ACTIVITY PLAN  Choose an activity that you enjoy, and set realistic goals. To exercise safely, you should begin practicing any new physical activity slowly, and gradually increase the intensity of the exercise over time. Your health care provider or diabetes educator can help create an activity plan that works for you. General recommendations include:  Encouraging children to engage in at least 60 minutes of physical activity each day.  Stretching and performing strength training exercises, such as yoga or weight lifting, at least 2 times per week.  Performing a total of at least 150 minutes of moderate-intensity exercise each week, such as brisk walking or water aerobics.  Exercising at least 3 days per week, making sure you allow no more than 2 consecutive days to pass without exercising.  Avoiding long periods of inactivity (90 minutes or more). When you  have to spend an extended period of time sitting down, take frequent breaks to walk or stretch. RECOMMENDATIONS FOR EXERCISING WITH TYPE 1 OR TYPE 2 DIABETES   Check your blood glucose before exercising. If blood glucose levels are greater than 240 mg/dL, check for urine ketones. Do not exercise if ketones are present.  Avoid injecting insulin into areas of the body that are going to be exercised. For example, avoid injecting insulin into:  The arms when playing tennis.  The legs when jogging.  Keep a record of:  Food intake before and after you exercise.  Expected peak times of insulin action.  Blood glucose levels before and after you exercise.  The type and amount of exercise you have done.  Review your records with your health care provider. Your health care provider will help you to develop guidelines for adjusting food intake and insulin amounts before and after exercising.  If you take insulin or oral hypoglycemic agents, watch for signs and symptoms of hypoglycemia. They include:  Dizziness.  Shaking.  Sweating.  Chills.  Confusion.  Drink plenty of water while you exercise to prevent dehydration or heat stroke. Body water is lost during exercise and must be replaced.  Talk to your health care provider before starting an exercise program to make sure it is safe for you. Remember, almost any type of activity is better than none.   This information is not intended to replace advice given to you by your health care provider. Make sure you discuss any questions you have with your health care provider.   Document Released: 09/15/2003 Document Revised: 11/09/2014 Document Reviewed: 12/02/2012 Elsevier Interactive Patient Education 2016 [employer]  Hyperglycemia High blood sugar (hyperglycemia) means that  the level of sugar in your blood is higher than it should be. Signs of high blood sugar include:  Feeling thirsty.  Frequent peeing (urinating).  Feeling  tired or sleepy.  Dry mouth.  Vision changes.  Feeling weak.  Feeling hungry but losing weight.  Numbness and tingling in your hands or feet.  Headache. When you ignore these signs, your blood sugar may keep going up. These problems may get worse, and other problems may begin. HOME CARE  Check your blood sugars as told by your doctor. Write down the numbers with the date and time.  Take the right amount of insulin or diabetes pills at the right time. Write down the dose with date and time.  Refill your insulin or diabetes pills before running out.  Watch what you eat. Follow your meal plan.  Drink liquids without sugar, such as water. Check with your doctor if you have kidney or heart disease.  Follow your doctor's orders for exercise. Exercise at the same time of day.  Keep your doctor's appointments. GET HELP RIGHT AWAY IF:   You have trouble thinking or are confused.  You have fast breathing with fruity smelling breath.  You pass out (faint).  You have 2 to 3 days of high blood sugars and you do not know why.  You have chest pain.  You are feeling sick to your stomach (nauseous) or throwing up (vomiting).  You have sudden vision changes. MAKE SURE YOU:   Understand these instructions.  Will watch your condition.  Will get help right away if you are not doing well or get worse.   This information is not intended to replace advice given to you by your health care provider. Make sure you discuss any questions you have with your health care provider.   Document Released: 04/22/2009 Document Revised: 07/16/2014 Document Reviewed: 03/01/2015 Elsevier Interactive Patient Education 2016 [employer].  Migraine Headache A migraine headache is an intense, throbbing pain on one or both sides of your head. A migraine can last for 30 minutes to several hours. CAUSES  The exact cause of a migraine headache is not always known. However, a migraine may be caused when  nerves in the brain become irritated and release chemicals that cause inflammation. This causes pain. Certain things may also trigger migraines, such as:  Alcohol.  Smoking.  Stress.  Menstruation.  Aged cheeses.  Foods or drinks that contain nitrates, glutamate, aspartame, or tyramine.  Lack of sleep.  Chocolate.  Caffeine.  Hunger.  Physical exertion.  Fatigue.  Medicines used to treat chest pain (nitroglycerine), birth control pills, estrogen, and some blood pressure medicines. SIGNS AND SYMPTOMS  Pain on one or both sides of your head.  Pulsating or throbbing pain.  Severe pain that prevents daily activities.  Pain that is aggravated by any physical activity.  Nausea, vomiting, or both.  Dizziness.  Pain with exposure to bright lights, loud noises, or activity.  General sensitivity to bright lights, loud noises, or smells. Before you get a migraine, you may get warning signs that a migraine is coming (aura). An aura may include:  Seeing flashing lights.  Seeing bright spots, halos, or zigzag lines.  Having tunnel vision or blurred vision.  Having feelings of numbness or tingling.  Having trouble talking.  Having muscle weakness. DIAGNOSIS  A migraine headache is often diagnosed based on:  Symptoms.  Physical exam.  A CT scan or MRI of your head. These imaging tests cannot diagnose migraines, but  they can help rule out other causes of headaches. TREATMENT Medicines may be given for pain and nausea. Medicines can also be given to help prevent recurrent migraines.  HOME CARE INSTRUCTIONS  Only take over-the-counter or prescription medicines for pain or discomfort as directed by your health care provider. The use of long-term narcotics is not recommended.  Lie down in a dark, quiet room when you have a migraine.  Keep a journal to find out what may trigger your migraine headaches. For example, write down:  What you eat and drink.  How much  sleep you get.  Any change to your diet or medicines.  Limit alcohol consumption.  Quit smoking if you smoke.  Get 7-9 hours of sleep, or as recommended by your health care provider.  Limit stress.  Keep lights dim if bright lights bother you and make your migraines worse. SEEK IMMEDIATE MEDICAL CARE IF:   Your migraine becomes severe.  You have a fever.  You have a stiff neck.  You have vision loss.  You have muscular weakness or loss of muscle control.  You start losing your balance or have trouble walking.  You feel faint or pass out.  You have severe symptoms that are different from your first symptoms. MAKE SURE YOU:   Understand these instructions.  Will watch your condition.  Will get help right away if you are not doing well or get worse.   This information is not intended to replace advice given to you by your health care provider. Make sure you discuss any questions you have with your health care provider.   Document Released: 06/25/2005 Document Revised: 07/16/2014 Document Reviewed: 03/02/2013 Elsevier Interactive Patient Education [employer]

## 2015-09-07 NOTE — ED Notes (Signed)
States she has a place on the right temple that if she rubs it, it causes her to have a tingling sensation all over her head. Has never had this happen before. Denies N/V/D or fever. Yesterday says she was nauseated but never vomited. Says she can barely hold her eyes open she is so tired.

## 2016-03-15 ENCOUNTER — Emergency Department
Admission: EM | Admit: 2016-03-15 | Discharge: 2016-03-15 | Disposition: A | Discharge status: Home / Self Care | Payer: Medicaid Other | Attending: Emergency Medicine | Admitting: Emergency Medicine

## 2016-03-15 DIAGNOSIS — Y939 Activity, unspecified: Secondary | ICD-10-CM | POA: Diagnosis not present

## 2016-03-15 DIAGNOSIS — Y929 Unspecified place or not applicable: Secondary | ICD-10-CM | POA: Diagnosis not present

## 2016-03-15 DIAGNOSIS — L089 Local infection of the skin and subcutaneous tissue, unspecified: Secondary | ICD-10-CM | POA: Diagnosis not present

## 2016-03-15 DIAGNOSIS — W57XXXA Bitten or stung by nonvenomous insect and other nonvenomous arthropods, initial encounter: Secondary | ICD-10-CM | POA: Insufficient documentation

## 2016-03-15 DIAGNOSIS — S70361A Insect bite (nonvenomous), right thigh, initial encounter: Secondary | ICD-10-CM | POA: Diagnosis present

## 2016-03-15 DIAGNOSIS — Y999 Unspecified external cause status: Secondary | ICD-10-CM | POA: Insufficient documentation

## 2016-03-15 MED ORDER — SULFAMETHOXAZOLE-TRIMETHOPRIM 800-160 MG PO TABS: 1.0000 | ORAL_TABLET | Freq: Two times a day (BID) | ORAL | 0 refills | Status: DC

## 2016-03-15 NOTE — ED Notes (Signed)
Pt with possible spider bite to right upper thigh two days ago. Pt states it was a black spider with two white spots on its back. Pt states since has been feeling dizzy and light headed. Blister noted to right thigh area with redness surrounding bite.

## 2016-03-15 NOTE — ED Triage Notes (Signed)
Pt arrives with reports that she was bitten by a spider yesterday on her right leg   Blistered area to right upper thigh present

## 2016-03-15 NOTE — ED Provider Notes (Signed)
[facility] Emergency Department Provider Note   ____________________________________________   First MD Initiated Contact with Patient 03/15/16 0840     (approximate)  I have reviewed the triage vital signs and the nursing notes.   HISTORY  Chief Complaint Insect Bite    HPI ELFIDA SHIMADA is a 25 y.o. female is here with  complaint of a skin eruption on the right anterior thigh that she states is because a spider bit her.Patient has not applied any medication to the area. Patient states that this occurred yesterday. She denies any difficulty with swallowing speaking or difficulty breathing. She denies any fever or chills. Currently she states her pain is 10 over 10.   No past medical history on file.  There are no active problems to display for this patient.   No past surgical history on file.  Prior to Admission medications   Medication Sig Start Date End Date Taking? Authorizing Provider  metFORMIN (GLUCOPHAGE) 500 MG tablet Take 1 tablet (500 mg total) by mouth 2 (two) times daily with a meal. 09/07/15 09/06/16  Myrna Blazer, MD  metroNIDAZOLE (FLAGYL) 500 MG tablet Take 1 tablet (500 mg total) by mouth 2 (two) times daily. 07/31/15   Darien Ramus, MD  naproxen (NAPROSYN) 500 MG tablet Take 1 tablet (500 mg total) by mouth 2 (two) times daily with a meal. 06/08/15   Sharman Cheek, MD  sulfamethoxazole-trimethoprim (BACTRIM DS,SEPTRA DS) 800-160 MG tablet Take 1 tablet by mouth 2 (two) times daily. 03/15/16   Tommi Rumps, PA-C    Allergies Amoxicillin  No family history on file.  Social History Social History  Substance Use Topics  . Smoking status: Never Smoker  . Smokeless tobacco: Not on file  . Alcohol use No    Review of Systems Constitutional: No fever/chills Eyes: No visual changes. ENT: No sore throat.Denies difficulty breathing. Cardiovascular: Denies chest pain. Respiratory: Denies shortness of  breath. Gastrointestinal: No abdominal pain.  No nausea, no vomiting.   Musculoskeletal: Negative for back pain. Skin: Positive for skin eruption. Neurological: Negative for headaches, focal weakness or numbness.  10-point ROS otherwise negative.  ____________________________________________   PHYSICAL EXAM:  VITAL SIGNS: ED Triage Vitals  Enc Vitals Group     BP 03/15/16 0818 101/70     Pulse Rate 03/15/16 0818 (!) 101     Resp 03/15/16 0818 17     Temp 03/15/16 0818 98.1 F (36.7 C)     Temp Source 03/15/16 0818 Oral     SpO2 03/15/16 0818 98 %     Weight 03/15/16 0820 110 lb (49.9 kg)     Height 03/15/16 0820 5' (1.524 m)     Head Circumference --      Peak Flow --      Pain Score 03/15/16 0820 10     Pain Loc --      Pain Edu? --      Excl. in GC? --     Constitutional: Alert and oriented. Well appearing and in no acute distress.Patient is asleep in the room and does not appear to be any acute distress. Eyes: Conjunctivae are normal. PERRL. EOMI. Head: Atraumatic. Nose: No congestion/rhinnorhea. Neck: No stridor.   Cardiovascular: Normal rate, regular rhythm. Grossly normal heart sounds.  Good peripheral circulation. Respiratory: Normal respiratory effort.  No retractions. Lungs CTAB. Gastrointestinal: Soft and nontender. No distention. Musculoskeletal: Moves upper and lower extremities without any difficulty. Normal gait was noted. Neurologic:  Normal speech  and language. No gross focal neurologic deficits are appreciated. No gait instability. Skin:  Skin is warm, dry and intact. There is a single vesicle that is intact. There is some extending cellulitis approximately 2 cm from this area. Area is minimally warm and tender to touch. No other areas are noted. Psychiatric: Mood and affect are normal. Speech and behavior are normal.  ____________________________________________   LABS (all labs ordered are listed, but only abnormal results are displayed)  Labs  Reviewed - No data to display  PROCEDURES  Procedure(s) performed: None  Procedures  Critical Care performed: No  ____________________________________________   INITIAL IMPRESSION / ASSESSMENT AND PLAN / ED COURSE  Pertinent labs & imaging results that were available during my care of the patient were reviewed by me and considered in my medical decision making (see chart for details).    Clinical Course   Patient is given prescription for Bactrim DS twice a day for 10 days. Patient is to follow-up with her primary care doctor if any continued problems. She is also encouraged take Tylenol if needed for pain. She'll return to the emergency room if any severe worsening of her symptoms.   ____________________________________________   FINAL CLINICAL IMPRESSION(S) / ED DIAGNOSES  Final diagnoses:  Infected insect bite      NEW MEDICATIONS STARTED DURING THIS VISIT:  Discharge Medication List as of 03/15/2016  9:15 AM    START taking these medications   Details  sulfamethoxazole-trimethoprim (BACTRIM DS,SEPTRA DS) 800-160 MG tablet Take 1 tablet by mouth 2 (two) times daily., Starting Thu 03/15/2016, Print         Note:  This document was prepared using Dragon voice recognition software and may include unintentional dictation errors.    Tommi RumpsRhonda L Cherylyn Sundby, PA-C 03/15/16 1322    Myrna Blazeravid Matthew Schaevitz, MD 03/15/16 1535

## 2016-03-19 MED ORDER — LABETALOL HCL 5 MG/ML IV SOLN
INTRAVENOUS | Status: AC
  Filled 2016-03-19: qty 4

## 2016-03-20 ENCOUNTER — Emergency Department
Admission: EM | Admit: 2016-03-20 | Discharge: 2016-03-20 | Disposition: A | Discharge status: Home / Self Care | Payer: Medicaid Other | Attending: Emergency Medicine | Admitting: Emergency Medicine

## 2016-03-20 ENCOUNTER — Encounter: Payer: Self-pay | Admitting: Emergency Medicine

## 2016-03-20 DIAGNOSIS — Z79899 Other long term (current) drug therapy: Secondary | ICD-10-CM | POA: Insufficient documentation

## 2016-03-20 DIAGNOSIS — Y929 Unspecified place or not applicable: Secondary | ICD-10-CM | POA: Diagnosis not present

## 2016-03-20 DIAGNOSIS — L02415 Cutaneous abscess of right lower limb: Secondary | ICD-10-CM | POA: Diagnosis present

## 2016-03-20 DIAGNOSIS — Y939 Activity, unspecified: Secondary | ICD-10-CM | POA: Diagnosis not present

## 2016-03-20 DIAGNOSIS — Z7984 Long term (current) use of oral hypoglycemic drugs: Secondary | ICD-10-CM | POA: Insufficient documentation

## 2016-03-20 DIAGNOSIS — W57XXXA Bitten or stung by nonvenomous insect and other nonvenomous arthropods, initial encounter: Secondary | ICD-10-CM | POA: Insufficient documentation

## 2016-03-20 DIAGNOSIS — S80861A Insect bite (nonvenomous), right lower leg, initial encounter: Secondary | ICD-10-CM

## 2016-03-20 DIAGNOSIS — L0291 Cutaneous abscess, unspecified: Secondary | ICD-10-CM

## 2016-03-20 DIAGNOSIS — Y999 Unspecified external cause status: Secondary | ICD-10-CM | POA: Insufficient documentation

## 2016-03-20 MED ORDER — HYDROCODONE-ACETAMINOPHEN 5-325 MG PO TABS
1.0000 | ORAL_TABLET | ORAL | Status: AC
  Administered 2016-03-20: 1 via ORAL
  Filled 2016-03-20: qty 1

## 2016-03-20 MED ORDER — SULFAMETHOXAZOLE-TRIMETHOPRIM 800-160 MG PO TABS: 1.0000 | ORAL_TABLET | Freq: Two times a day (BID) | ORAL | 0 refills | Status: DC

## 2016-03-20 MED ORDER — LIDOCAINE-EPINEPHRINE 2 %-1:100000 IJ SOLN: 30.0000 mL | Freq: Once | INTRAMUSCULAR | Status: DC

## 2016-03-20 MED ORDER — LIDOCAINE-EPINEPHRINE (PF) 1 %-1:200000 IJ SOLN
INTRAMUSCULAR | Status: AC
  Administered 2016-03-20: 10 mL via INTRADERMAL
  Filled 2016-03-20: qty 30

## 2016-03-20 MED ORDER — LIDOCAINE-EPINEPHRINE (PF) 1 %-1:200000 IJ SOLN
10.0000 mL | Freq: Once | INTRAMUSCULAR | Status: AC
  Administered 2016-03-20: 10 mL via INTRADERMAL

## 2016-03-20 MED ORDER — TRAMADOL HCL 50 MG PO TABS: 50.0000 mg | ORAL_TABLET | Freq: Four times a day (QID) | ORAL | 0 refills | Status: DC | PRN

## 2016-03-20 NOTE — ED Notes (Signed)
Discharge instructions reviewed with patient. Questions fielded by this RN. Patient verbalizes understanding of instructions. Patient discharged home in stable condition per Gaines PA. No acute distress noted at time of discharge.   

## 2016-03-20 NOTE — Discharge Instructions (Signed)
Please follow-up with the ER or the wound clinic in 2-3 days for a wound check. Continue with antibiotics. Return to the ER for any fevers worsening symptoms urgent changes in her health.

## 2016-03-20 NOTE — ED Notes (Signed)
Pt seen here for spider bite to right thigh 9/7, here for worsening symptoms.

## 2016-03-20 NOTE — ED Provider Notes (Signed)
[facility]-EMERGENCY DEPARTMENT Provider Note   CSN: [id number] Arrival date & time: 03/20/16  2114     History   Chief Complaint Chief Complaint  Patient presents with  . Insect Bite    HPI Chelsea Vazquez is a 25 y.o. female presents to the emergency department for evaluation of right distal lateral thigh abscess. Patient states she was bitten by a brown spider on 03/14/2016, on 03/15/2016 was seen in the emergency department placed on Bactrim DS. She's had increase in pain and swelling in the right distal thigh drainage to the bite site. Patient denies any fevers numbness or tingling. Pain and swelling as above the knee but she has no pain with knee range of motion. Patient has been taking Tylenol and ibuprofen with no improvement. Pain is 10 out of 10. Been taking the Bactrim as prescribed.  HPI  History reviewed. No pertinent past medical history.  There are no active problems to display for this patient.   History reviewed. No pertinent surgical history.  OB History    No data available       Home Medications    Prior to Admission medications   Medication Sig Start Date End Date Taking? Authorizing Provider  metFORMIN (GLUCOPHAGE) 500 MG tablet Take 1 tablet (500 mg total) by mouth 2 (two) times daily with a meal. 09/07/15 09/06/16  Myrna Blazeravid Matthew Schaevitz, MD  metroNIDAZOLE (FLAGYL) 500 MG tablet Take 1 tablet (500 mg total) by mouth 2 (two) times daily. 07/31/15   Darien Ramusavid W Kaminski, MD  naproxen (NAPROSYN) 500 MG tablet Take 1 tablet (500 mg total) by mouth 2 (two) times daily with a meal. 06/08/15   Sharman CheekPhillip Stafford, MD  sulfamethoxazole-trimethoprim (BACTRIM DS,SEPTRA DS) 800-160 MG tablet Take 1 tablet by mouth 2 (two) times daily. 03/20/16   Evon Slackhomas C Gaines, PA-C  traMADol (ULTRAM) 50 MG tablet Take 1 tablet (50 mg total) by mouth every 6 (six) hours as needed. 03/20/16   Evon Slackhomas C Gaines, PA-C    Family History No family history on file.  Social History Social  History  Substance Use Topics  . Smoking status: Never Smoker  . Smokeless tobacco: Never Used  . Alcohol use No     Allergies   Amoxicillin   Review of Systems Review of Systems  Constitutional: Negative for activity change, chills, fatigue and fever.  HENT: Negative for congestion, sinus pressure and sore throat.   Eyes: Negative for visual disturbance.  Respiratory: Negative for cough, chest tightness and shortness of breath.   Cardiovascular: Negative for chest pain and leg swelling.  Gastrointestinal: Negative for abdominal pain, diarrhea, nausea and vomiting.  Genitourinary: Negative for dysuria.  Musculoskeletal: Negative for arthralgias and gait problem.  Skin: Positive for wound. Negative for rash.  Neurological: Negative for weakness, numbness and headaches.  Hematological: Negative for adenopathy.  Psychiatric/Behavioral: Negative for agitation, behavioral problems and confusion.     Physical Exam Updated Vital Signs BP 119/83 (BP Location: Left Arm)   Pulse (!) 110   Temp 98.3 F (36.8 C) (Oral)   Resp 18   Ht 5' (1.524 m)   Wt 49.9 kg   LMP 03/09/2016   SpO2 97%   BMI 21.48 kg/m   Physical Exam  Constitutional: She is oriented to person, place, and time. She appears well-developed and well-nourished. No distress.  HENT:  Head: Normocephalic and atraumatic.  Mouth/Throat: Oropharynx is clear and moist.  Eyes: EOM are normal. Pupils are equal, round, and reactive to light. Right eye  exhibits no discharge. Left eye exhibits no discharge.  Neck: Normal range of motion. Neck supple.  Cardiovascular: Normal rate, regular rhythm and intact distal pulses.   Pulmonary/Chest: Effort normal and breath sounds normal. No respiratory distress. She exhibits no tenderness.  Abdominal: Soft. She exhibits no distension. There is no tenderness.  Musculoskeletal: Normal range of motion. She exhibits no edema.  Full range of motion of the right knee with no knee effusion  warmth or erythema.  Neurological: She is alert and oriented to person, place, and time. She has normal reflexes.  Skin: Skin is warm and dry.  Right lateral distal thigh there is a 6 cm area in diameter of induration. This is marked with a skin pen. The central aspect of the abscess is fluctuant with skin necrosis. There is purulent drainage.  Psychiatric: She has a normal mood and affect. Her behavior is normal. Thought content normal.     ED Treatments / Results  Labs (all labs ordered are listed, but only abnormal results are displayed) Labs Reviewed - No data to display  EKG  EKG Interpretation None       Radiology No results found.  Procedures Procedures (including critical care time) INCISION AND DRAINAGE Performed by: Patience Musca Consent: Verbal consent obtained. Risks and benefits: risks, benefits and alternatives were discussed Type: abscess  Body area: Right distal anterior lateral thigh  Anesthesia: local infiltration  Incision was made with a scalpel.  Local anesthetic: lidocaine 1 % with epinephrine  Anesthetic total: 5 ml  Complexity: complex Blunt dissection to break up loculations  Drainage: purulent  Drainage amount: 5 cc   Packing material: 1/4 in iodoform gauze  Patient tolerance: Patient tolerated the procedure well with no immediate complications.    Medications Ordered in ED Medications  HYDROcodone-acetaminophen (NORCO/VICODIN) 5-325 MG per tablet 1 tablet (1 tablet Oral Given 03/20/16 2204)  lidocaine-EPINEPHrine (XYLOCAINE-EPINEPHrine) 1 %-1:200000 (PF) injection 10 mL (10 mLs Intradermal Given 03/20/16 2215)     Initial Impression / Assessment and Plan / ED Course  I have reviewed the triage vital signs and the nursing notes.  Pertinent labs & imaging results that were available during my care of the patient were reviewed by me and considered in my medical decision making (see chart for details).  Clinical  Course    25 year old female with right distal anterior lateral thigh abscess. This is measured to be approximate 6 cm in diameter of induration with 2 cm of centralized ulceration and necrotic tissue with fluctuance. Incision and drainage was performed. 5 cc of purulent drainage was removed from the abscess and packed with iodoform packing. She will continue with Bactrim DS. Prescription for tramadol was given. Patient will follow-up with one clinic or return to the ED in 2 days for wound check, wound repacking.  Final Clinical Impressions(s) / ED Diagnoses   Final diagnoses:  Abscess  Nonvenomous spider bite of right lower leg, initial encounter    New Prescriptions New Prescriptions   SULFAMETHOXAZOLE-TRIMETHOPRIM (BACTRIM DS,SEPTRA DS) 800-160 MG TABLET    Take 1 tablet by mouth 2 (two) times daily.   TRAMADOL (ULTRAM) 50 MG TABLET    Take 1 tablet (50 mg total) by mouth every 6 (six) hours as needed.     Evon Slack, PA-C 03/20/16 2234    Phineas Semen, MD 03/22/16 [phone]

## 2016-03-20 NOTE — ED Triage Notes (Signed)
Pt presents to ED reports was seen here for spider bite and was told to take tylenol and motrin for pain, pt reports insect bit has become worse. Swelling, redness, and drainage noted above right knee.

## 2016-03-20 NOTE — ED Notes (Signed)
Pt noted to have a wound to right anterior lower lateral thigh above the right knee. The area has necrotic center with eschar noted. Pt has some drainage. Pt has redness tender and swelling to area. Area noted to be size of tennis ball. Pt states pain to move leg. Pt states was bite by spider a week ago seen in the ER given Po abts continues to get worse.

## 2016-03-22 ENCOUNTER — Emergency Department
Admission: EM | Admit: 2016-03-22 | Discharge: 2016-03-22 | Disposition: A | Discharge status: Home / Self Care | Payer: Medicaid Other | Attending: Emergency Medicine | Admitting: Emergency Medicine

## 2016-03-22 ENCOUNTER — Encounter: Payer: Self-pay | Admitting: Emergency Medicine

## 2016-03-22 DIAGNOSIS — T63391D Toxic effect of venom of other spider, accidental (unintentional), subsequent encounter: Secondary | ICD-10-CM | POA: Diagnosis present

## 2016-03-22 DIAGNOSIS — Z79899 Other long term (current) drug therapy: Secondary | ICD-10-CM | POA: Insufficient documentation

## 2016-03-22 DIAGNOSIS — Z7984 Long term (current) use of oral hypoglycemic drugs: Secondary | ICD-10-CM | POA: Diagnosis not present

## 2016-03-22 DIAGNOSIS — T63301D Toxic effect of unspecified spider venom, accidental (unintentional), subsequent encounter: Secondary | ICD-10-CM

## 2016-03-22 MED ORDER — OXYCODONE-ACETAMINOPHEN 5-325 MG PO TABS: 1.0000 | ORAL_TABLET | ORAL | 0 refills | Status: DC | PRN

## 2016-03-22 NOTE — ED Triage Notes (Signed)
Pt presents with pain to right thigh; wants recheck of insect bite. Pt states she still has pain and the prescribed tramadol makes her vomit. Pt alert & oriented with NAD noted.

## 2016-03-22 NOTE — ED Provider Notes (Signed)
[facility] Emergency Department Provider Note  ____________________________________________  Time seen: Approximately 8:58 AM  I have reviewed the triage vital signs and the nursing notes.   HISTORY  Chief Complaint Insect Bite    HPI Chelsea Vazquez is a 25 y.o. female who presents for evaluation of spider bite. Patient has been seen here several different times. Last seen here 2 days ago. Had an I&D performed with packing. Patient is here for reevaluation.   History reviewed. No pertinent past medical history.  There are no active problems to display for this patient.   History reviewed. No pertinent surgical history.  Prior to Admission medications   Medication Sig Start Date End Date Taking? Authorizing Provider  metFORMIN (GLUCOPHAGE) 500 MG tablet Take 1 tablet (500 mg total) by mouth 2 (two) times daily with a meal. 09/07/15 09/06/16  Myrna Blazer, MD  metroNIDAZOLE (FLAGYL) 500 MG tablet Take 1 tablet (500 mg total) by mouth 2 (two) times daily. 07/31/15   Darien Ramus, MD  naproxen (NAPROSYN) 500 MG tablet Take 1 tablet (500 mg total) by mouth 2 (two) times daily with a meal. 06/08/15   Sharman Cheek, MD  oxyCODONE-acetaminophen (ROXICET) 5-325 MG tablet Take 1-2 tablets by mouth every 4 (four) hours as needed for severe pain. 03/22/16   Charmayne Sheer Evyn Kooyman, PA-C  sulfamethoxazole-trimethoprim (BACTRIM DS,SEPTRA DS) 800-160 MG tablet Take 1 tablet by mouth 2 (two) times daily. 03/20/16   Evon Slack, PA-C    Allergies Amoxicillin  History reviewed. No pertinent family history.  Social History Social History  Substance Use Topics  . Smoking status: Never Smoker  . Smokeless tobacco: Never Used  . Alcohol use No    Review of Systems Constitutional: No fever/chills Gastrointestinal: No abdominal pain.  No nausea, no vomiting.  No diarrhea.  No constipation. Musculoskeletal: Negative for back pain. Skin: Positive for  wound check. Neurological: Negative for headaches, focal weakness or numbness.  10-point ROS otherwise negative.  ____________________________________________   PHYSICAL EXAM: BP 107/72 (BP Location: Left Arm)   Pulse 94   Temp 98 F (36.7 C) (Oral)   Ht 5' (1.524 m)   Wt 47.6 kg   LMP 03/09/2016   SpO2 100%   BMI 20.51 kg/m   VITAL SIGNS: ED Triage Vitals  Enc Vitals Group     BP      Pulse      Resp      Temp      Temp src      SpO2      Weight      Height      Head Circumference      Peak Flow      Pain Score      Pain Loc      Pain Edu?      Excl. in GC?     Constitutional: Alert and oriented. Well appearing and in no acute distress. Eyes: Conjunctivae are normal. PERRL. EOMI. Head: Atraumatic. Nose: No congestion/rhinnorhea. Mouth/Throat: Mucous membranes are moist.  Oropharynx non-erythematous. Neck: No stridor.   Cardiovascular: Normal rate, regular rhythm. Grossly normal heart sounds.  Good peripheral circulation. Respiratory: Normal respiratory effort.  No retractions. Lungs CTAB. Gastrointestinal: Soft and nontender. No distention. No abdominal bruits. No CVA tenderness. Musculoskeletal: No lower extremity tenderness nor edema.  No joint effusions. Neurologic:  Normal speech and language. No gross focal neurologic deficits are appreciated. No gait instability. Skin:  Skin is warm, dry and intact. No  rash noted. Packing removed no evidence of increased pustular drainage. Minimal erythema noted around the wound site. Psychiatric: Mood and affect are normal. Speech and behavior are normal.  ____________________________________________   LABS (all labs ordered are listed, but only abnormal results are displayed)  Labs Reviewed - No data to display ____________________________________________  EKG   ____________________________________________  RADIOLOGY   ____________________________________________   PROCEDURES  Procedure(s) performed:  None  Critical Care performed: No  ____________________________________________   INITIAL IMPRESSION / ASSESSMENT AND PLAN / ED COURSE  Pertinent labs & imaging results that were available during my care of the patient were reviewed by me and considered in my medical decision making (see chart for details). Review of the [place] CSRS was performed in accordance of the [facility] prior to dispensing any controlled drugs.  Spider bite with I&D and packing change. Patient tolerated procedure well she is to return to the ED in 48 hours for wound check again.  Clinical Course    ____________________________________________   FINAL CLINICAL IMPRESSION(S) / ED DIAGNOSES  Final diagnoses:  Spider bite, accidental or unintentional, subsequent encounter     This chart was dictated using voice recognition software/Dragon. Despite best efforts to proofread, errors can occur which can change the meaning. Any change was purely unintentional.    Evangeline Dakinharles M Min Tunnell, PA-C 03/22/16 [phone]    Governor Rooksebecca Lord, MD 03/22/16 [phone]

## 2016-03-22 NOTE — ED Notes (Signed)
Pt discharged home after verbalizing understanding of discharge instructions; nad noted. 

## 2016-03-26 ENCOUNTER — Ambulatory Visit: Payer: Self-pay | Admitting: Surgery

## 2016-06-02 ENCOUNTER — Encounter: Payer: Self-pay | Admitting: Emergency Medicine

## 2016-06-02 ENCOUNTER — Emergency Department
Admission: EM | Admit: 2016-06-02 | Discharge: 2016-06-02 | Disposition: A | Discharge status: Home / Self Care | Payer: Medicaid Other | Attending: Student in an Organized Health Care Education/Training Program | Admitting: Student in an Organized Health Care Education/Training Program

## 2016-06-02 DIAGNOSIS — Y939 Activity, unspecified: Secondary | ICD-10-CM | POA: Insufficient documentation

## 2016-06-02 DIAGNOSIS — S80862A Insect bite (nonvenomous), left lower leg, initial encounter: Secondary | ICD-10-CM | POA: Diagnosis present

## 2016-06-02 DIAGNOSIS — Y999 Unspecified external cause status: Secondary | ICD-10-CM | POA: Insufficient documentation

## 2016-06-02 DIAGNOSIS — Z7984 Long term (current) use of oral hypoglycemic drugs: Secondary | ICD-10-CM | POA: Diagnosis not present

## 2016-06-02 DIAGNOSIS — L03116 Cellulitis of left lower limb: Secondary | ICD-10-CM | POA: Diagnosis not present

## 2016-06-02 DIAGNOSIS — Z79899 Other long term (current) drug therapy: Secondary | ICD-10-CM | POA: Diagnosis not present

## 2016-06-02 DIAGNOSIS — Z791 Long term (current) use of non-steroidal anti-inflammatories (NSAID): Secondary | ICD-10-CM | POA: Diagnosis not present

## 2016-06-02 DIAGNOSIS — Y929 Unspecified place or not applicable: Secondary | ICD-10-CM | POA: Insufficient documentation

## 2016-06-02 DIAGNOSIS — W57XXXA Bitten or stung by nonvenomous insect and other nonvenomous arthropods, initial encounter: Secondary | ICD-10-CM | POA: Diagnosis not present

## 2016-06-02 MED ORDER — TRAMADOL HCL 50 MG PO TABS
50.0000 mg | ORAL_TABLET | Freq: Once | ORAL | Status: AC
  Administered 2016-06-02: 50 mg via ORAL
  Filled 2016-06-02: qty 1

## 2016-06-02 MED ORDER — PROMETHAZINE HCL 25 MG PO TABS: 25.0000 mg | ORAL_TABLET | Freq: Four times a day (QID) | ORAL | 0 refills | Status: DC | PRN

## 2016-06-02 MED ORDER — PROMETHAZINE HCL 25 MG PO TABS
25.0000 mg | ORAL_TABLET | Freq: Once | ORAL | Status: AC
  Administered 2016-06-02: 25 mg via ORAL

## 2016-06-02 MED ORDER — TRAMADOL HCL 50 MG PO TABS: 50.0000 mg | ORAL_TABLET | Freq: Four times a day (QID) | ORAL | 0 refills | Status: DC | PRN

## 2016-06-02 MED ORDER — IBUPROFEN 600 MG PO TABS
600.0000 mg | ORAL_TABLET | Freq: Once | ORAL | Status: AC
  Administered 2016-06-02: 600 mg via ORAL
  Filled 2016-06-02: qty 1

## 2016-06-02 MED ORDER — SULFAMETHOXAZOLE-TRIMETHOPRIM 800-160 MG PO TABS: 1.0000 | ORAL_TABLET | Freq: Two times a day (BID) | ORAL | 0 refills | Status: DC

## 2016-06-02 MED ORDER — PROMETHAZINE HCL 25 MG PO TABS
ORAL_TABLET | ORAL | Status: AC
  Filled 2016-06-02: qty 1

## 2016-06-02 MED ORDER — IBUPROFEN 600 MG PO TABS: 600.0000 mg | ORAL_TABLET | Freq: Three times a day (TID) | ORAL | 0 refills | Status: DC | PRN

## 2016-06-02 MED ORDER — SULFAMETHOXAZOLE-TRIMETHOPRIM 800-160 MG PO TABS
1.0000 | ORAL_TABLET | Freq: Once | ORAL | Status: AC
  Administered 2016-06-02: 1 via ORAL
  Filled 2016-06-02: qty 1

## 2016-06-02 NOTE — ED Provider Notes (Signed)
[facility] Emergency Department Provider Note   ____________________________________________   First MD Initiated Contact with Patient 06/02/16 1040     (approximate)  I have reviewed the triage vital signs and the nursing notes.   HISTORY  Chief Complaint Insect Bite    HPI Chelsea Vazquez is a 25 y.o. female patient complaining of insect bite with edema and erythema to the left lower leg. Patient state onset 2 days ago. Patient states she knows a pimple on the day of the bite. Today she noticed increased edema and erythema. No palliative measures for this complaint. Patient rated pain as a 10 over 10. Patient described a pain as "sharp". History reviewed. No pertinent past medical history.  There are no active problems to display for this patient.   History reviewed. No pertinent surgical history.  Prior to Admission medications   Medication Sig Start Date End Date Taking? Authorizing Provider  ibuprofen (ADVIL,MOTRIN) 600 MG tablet Take 1 tablet (600 mg total) by mouth every 8 (eight) hours as needed. 06/02/16   Joni Reiningonald K Jodeci Rini, PA-C  metFORMIN (GLUCOPHAGE) 500 MG tablet Take 1 tablet (500 mg total) by mouth 2 (two) times daily with a meal. 09/07/15 09/06/16  Myrna Blazeravid Matthew Schaevitz, MD  metroNIDAZOLE (FLAGYL) 500 MG tablet Take 1 tablet (500 mg total) by mouth 2 (two) times daily. 07/31/15   Darien Ramusavid W Kaminski, MD  naproxen (NAPROSYN) 500 MG tablet Take 1 tablet (500 mg total) by mouth 2 (two) times daily with a meal. 06/08/15   Sharman CheekPhillip Stafford, MD  oxyCODONE-acetaminophen (ROXICET) 5-325 MG tablet Take 1-2 tablets by mouth every 4 (four) hours as needed for severe pain. 03/22/16   Charmayne Sheerharles M Beers, PA-C  sulfamethoxazole-trimethoprim (BACTRIM DS,SEPTRA DS) 800-160 MG tablet Take 1 tablet by mouth 2 (two) times daily. 03/20/16   Evon Slackhomas C Gaines, PA-C  sulfamethoxazole-trimethoprim (BACTRIM DS,SEPTRA DS) 800-160 MG tablet Take 1 tablet by mouth 2 (two) times  daily. 06/02/16   Joni Reiningonald K Shakeitha Umbaugh, PA-C  traMADol (ULTRAM) 50 MG tablet Take 1 tablet (50 mg total) by mouth every 6 (six) hours as needed for moderate pain. 06/02/16   Joni Reiningonald K Charbel Los, PA-C    Allergies Amoxicillin  No family history on file.  Social History Social History  Substance Use Topics  . Smoking status: Never Smoker  . Smokeless tobacco: Never Used  . Alcohol use No    Review of Systems Constitutional: No fever/chills Eyes: No visual changes. ENT: No sore throat. Cardiovascular: Denies chest pain. Respiratory: Denies shortness of breath. Gastrointestinal: No abdominal pain.  No nausea, no vomiting.  No diarrhea.  No constipation. Genitourinary: Negative for dysuria. Musculoskeletal: Negative for back pain. Skin: Negative for rash.Edema and erythema left lateral lower leg. Neurological: Negative for headaches, focal weakness or numbness. Allergic/Immunilogical: Amoxicillin _________________________________________   PHYSICAL EXAM:  VITAL SIGNS: ED Triage Vitals  Enc Vitals Group     BP 06/02/16 0947 (!) 128/110     Pulse Rate 06/02/16 0947 100     Resp 06/02/16 0947 18     Temp 06/02/16 0947 98.3 F (36.8 C)     Temp Source 06/02/16 0947 Oral     SpO2 06/02/16 0947 100 %     Weight 06/02/16 0947 102 lb (46.3 kg)     Height 06/02/16 0947 5\' 2"  (1.575 m)     Head Circumference --      Peak Flow --      Pain Score 06/02/16 0948 10  Pain Loc --      Pain Edu? --      Excl. in GC? --     Constitutional: Alert and oriented. Well appearing and in no acute distress. Eyes: Conjunctivae are normal. PERRL. EOMI. Head: Atraumatic. Nose: No congestion/rhinnorhea. Mouth/Throat: Mucous membranes are moist.  Oropharynx non-erythematous. Neck: No stridor.  No cervical spine tenderness to palpation. Hematological/Lymphatic/Immunilogical: No cervical lymphadenopathy. Cardiovascular: Normal rate, regular rhythm. Grossly normal heart sounds.  Good peripheral  circulation. Respiratory: Normal respiratory effort.  No retractions. Lungs CTAB. Gastrointestinal: Soft and nontender. No distention. No abdominal bruits. No CVA tenderness. Musculoskeletal: No lower extremity tenderness nor edema.  No joint effusions. Neurologic:  Normal speech and language. No gross focal neurologic deficits are appreciated. No gait instability. Skin:  Skin is warm, dry and intact. No rash noted. Edema and erythema left lower leg. Psychiatric: Mood and affect are normal. Speech and behavior are normal.  ____________________________________________   LABS (all labs ordered are listed, but only abnormal results are displayed)  Labs Reviewed - No data to display ____________________________________________  EKG   ____________________________________________  RADIOLOGY   ____________________________________________   PROCEDURES  Procedure(s) performed: None  Procedures  Critical Care performed: No  ____________________________________________   INITIAL IMPRESSION / ASSESSMENT AND PLAN / ED COURSE  Pertinent labs & imaging results that were available during my care of the patient were reviewed by me and considered in my medical decision making (see chart for details).  Infected insect bite left lower leg. Patient given discharge care instructions. Patient prescription for Bactrim, tramadol, and ibuprofen. Patient advised follow-up with family clinic if condition persists.  Clinical Course      ____________________________________________   FINAL CLINICAL IMPRESSION(S) / ED DIAGNOSES  Final diagnoses:  Insect bite, initial encounter  Cellulitis of left lower extremity      NEW MEDICATIONS STARTED DURING THIS VISIT:  New Prescriptions   IBUPROFEN (ADVIL,MOTRIN) 600 MG TABLET    Take 1 tablet (600 mg total) by mouth every 8 (eight) hours as needed.   SULFAMETHOXAZOLE-TRIMETHOPRIM (BACTRIM DS,SEPTRA DS) 800-160 MG TABLET    Take 1 tablet by  mouth 2 (two) times daily.   TRAMADOL (ULTRAM) 50 MG TABLET    Take 1 tablet (50 mg total) by mouth every 6 (six) hours as needed for moderate pain.     Note:  This document was prepared using Dragon voice recognition software and may include unintentional dictation errors.    Joni Reiningonald K Jessica Checketts, PA-C 06/02/16 1050    Willy EddyPatrick Robinson, MD 06/02/16 1154

## 2016-06-02 NOTE — Discharge Instructions (Signed)
Warm compresses applied to the area 4 times a day for 5 minutes.

## 2016-06-02 NOTE — ED Triage Notes (Signed)
Small red area L lower leg, thinks is spider bite.

## 2016-06-11 ENCOUNTER — Emergency Department
Admission: EM | Admit: 2016-06-11 | Discharge: 2016-06-11 | Disposition: A | Discharge status: Home / Self Care | Payer: Medicaid Other | Attending: Emergency Medicine | Admitting: Emergency Medicine

## 2016-06-11 ENCOUNTER — Encounter: Payer: Self-pay | Admitting: Medical Oncology

## 2016-06-11 DIAGNOSIS — L0291 Cutaneous abscess, unspecified: Secondary | ICD-10-CM

## 2016-06-11 DIAGNOSIS — Z79899 Other long term (current) drug therapy: Secondary | ICD-10-CM | POA: Diagnosis not present

## 2016-06-11 DIAGNOSIS — L02416 Cutaneous abscess of left lower limb: Secondary | ICD-10-CM | POA: Diagnosis not present

## 2016-06-11 DIAGNOSIS — Z7984 Long term (current) use of oral hypoglycemic drugs: Secondary | ICD-10-CM | POA: Insufficient documentation

## 2016-06-11 DIAGNOSIS — Z792 Long term (current) use of antibiotics: Secondary | ICD-10-CM | POA: Diagnosis not present

## 2016-06-11 DIAGNOSIS — M79662 Pain in left lower leg: Secondary | ICD-10-CM | POA: Diagnosis present

## 2016-06-11 MED ORDER — OXYCODONE-ACETAMINOPHEN 5-325 MG PO TABS: 1.0000 | ORAL_TABLET | Freq: Four times a day (QID) | ORAL | 0 refills | Status: AC | PRN

## 2016-06-11 MED ORDER — LIDOCAINE HCL (PF) 1 % IJ SOLN
INTRAMUSCULAR | Status: AC
  Filled 2016-06-11: qty 5

## 2016-06-11 MED ORDER — OXYCODONE-ACETAMINOPHEN 5-325 MG PO TABS
1.0000 | ORAL_TABLET | Freq: Once | ORAL | Status: AC
  Administered 2016-06-11: 1 via ORAL
  Filled 2016-06-11: qty 1

## 2016-06-11 MED ORDER — CLINDAMYCIN HCL 150 MG PO CAPS: 150.0000 mg | ORAL_CAPSULE | Freq: Four times a day (QID) | ORAL | 0 refills | Status: DC

## 2016-06-11 NOTE — ED Triage Notes (Signed)
Pt reports possible spider bite to Lt leg.

## 2016-06-11 NOTE — ED Notes (Signed)
Pt reports was bit by a spider on her left leg the day after Thanksgiving. Pt reports did not see the spider but has had a spider bite before. Pt reports was seen here last week and was given antibiotics and pain medications. Pt reports pain meds are not working and her leg is still swelling. Pt reports area is worse than before. Pt left leg below the knee red, swollen, warm to the touch. Pt reports she was given tramadol with no relief and has been using her cousins pain medication for sickle cell. Pt reports she take percocet. Educated patient on the importance of not taking medications not prescribed to her. Pt verbalized understanding but reports pain is too much.

## 2016-06-11 NOTE — ED Provider Notes (Signed)
[facility] Emergency Department Provider Note   ____________________________________________   First MD Initiated Contact with Patient 06/11/16 1008     (approximate)  I have reviewed the triage vital signs and the nursing notes.   HISTORY  Chief Complaint Insect Bite    HPI Chelsea Vazquez is a 25 y.o. female patient returns secondary to increased edema and erythema and pain to the left lower leg. Patient was seen and treated for an insect bite and states no relief with Bactrim and tramadol.Patient is rating the pain as 8/10. Patient described a pain as "sharp". Patient states used  cousin's Percocet received transient relief.  History reviewed. No pertinent past medical history.  There are no active problems to display for this patient.   History reviewed. No pertinent surgical history.  Prior to Admission medications   Medication Sig Start Date End Date Taking? Authorizing Provider  clindamycin (CLEOCIN) 150 MG capsule Take 1 capsule (150 mg total) by mouth 4 (four) times daily. 06/11/16   Joni Reiningonald K Smith, PA-C  ibuprofen (ADVIL,MOTRIN) 600 MG tablet Take 1 tablet (600 mg total) by mouth every 8 (eight) hours as needed. 06/02/16   Joni Reiningonald K Smith, PA-C  metFORMIN (GLUCOPHAGE) 500 MG tablet Take 1 tablet (500 mg total) by mouth 2 (two) times daily with a meal. 09/07/15 09/06/16  Myrna Blazeravid Matthew Schaevitz, MD  metroNIDAZOLE (FLAGYL) 500 MG tablet Take 1 tablet (500 mg total) by mouth 2 (two) times daily. 07/31/15   Darien Ramusavid W Kaminski, MD  naproxen (NAPROSYN) 500 MG tablet Take 1 tablet (500 mg total) by mouth 2 (two) times daily with a meal. 06/08/15   Sharman CheekPhillip Stafford, MD  oxyCODONE-acetaminophen (ROXICET) 5-325 MG tablet Take 1-2 tablets by mouth every 4 (four) hours as needed for severe pain. 03/22/16   Evangeline Dakinharles M Beers, PA-C  oxyCODONE-acetaminophen (ROXICET) 5-325 MG tablet Take 1 tablet by mouth every 6 (six) hours as needed. 06/11/16 06/11/17  Joni Reiningonald K Smith,  PA-C  promethazine (PHENERGAN) 25 MG tablet Take 1 tablet (25 mg total) by mouth every 6 (six) hours as needed for nausea or vomiting. 06/02/16   Joni Reiningonald K Smith, PA-C  sulfamethoxazole-trimethoprim (BACTRIM DS,SEPTRA DS) 800-160 MG tablet Take 1 tablet by mouth 2 (two) times daily. 03/20/16   Evon Slackhomas C Gaines, PA-C  sulfamethoxazole-trimethoprim (BACTRIM DS,SEPTRA DS) 800-160 MG tablet Take 1 tablet by mouth 2 (two) times daily. 06/02/16   Joni Reiningonald K Smith, PA-C  traMADol (ULTRAM) 50 MG tablet Take 1 tablet (50 mg total) by mouth every 6 (six) hours as needed for moderate pain. 06/02/16   Joni Reiningonald K Smith, PA-C    Allergies Amoxicillin  No family history on file.  Social History Social History  Substance Use Topics  . Smoking status: Never Smoker  . Smokeless tobacco: Never Used  . Alcohol use No    Review of Systems Constitutional: No fever/chills Eyes: No visual changes. ENT: No sore throat. Cardiovascular: Denies chest pain. Respiratory: Denies shortness of breath. Gastrointestinal: No abdominal pain.  No nausea, no vomiting.  No diarrhea.  No constipation. Genitourinary: Negative for dysuria. Musculoskeletal: Negative for back pain. Skin: He edema erythema left lower leg. Neurological: Negative for headaches, focal weakness or numbness. Hematological/Lymphatic: Allergic/Immunilogical: Amoxicillin ___________________________________________   PHYSICAL EXAM:  VITAL SIGNS: ED Triage Vitals  Enc Vitals Group     BP 06/11/16 0934 97/70     Pulse Rate 06/11/16 0934 (!) 108     Resp 06/11/16 0934 16     Temp 06/11/16  0934 97.4 F (36.3 C)     Temp Source 06/11/16 0934 Oral     SpO2 06/11/16 0934 100 %     Weight 06/11/16 0923 102 lb (46.3 kg)     Height 06/11/16 0923 5\' 2"  (1.575 m)     Head Circumference --      Peak Flow --      Pain Score 06/11/16 0923 8     Pain Loc --      Pain Edu? --      Excl. in GC? --     Constitutional: Alert and oriented. Well appearing  and in no acute distress. Eyes: Conjunctivae are normal. PERRL. EOMI. Head: Atraumatic. Nose: No congestion/rhinnorhea. Mouth/Throat: Mucous membranes are moist.  Oropharynx non-erythematous. Neck: No stridor.  No cervical spine tenderness to palpation. Hematological/Lymphatic/Immunilogical: No cervical lymphadenopathy. Cardiovascular: Normal rate, regular rhythm. Grossly normal heart sounds.  Good peripheral circulation. Respiratory: Normal respiratory effort.  No retractions. Lungs CTAB. Gastrointestinal: Soft and nontender. No distention. No abdominal bruits. No CVA tenderness. Musculoskeletal: No lower extremity tenderness nor edema.  No joint effusions. Neurologic:  Normal speech and language. No gross focal neurologic deficits are appreciated. No gait instability. Skin:  Skin is warm, dry and intact.Abscess left lower leg Psychiatric: Mood and affect are normal. Speech and behavior are normal.  ____________________________________________   LABS (all labs ordered are listed, but only abnormal results are displayed)  Labs Reviewed - No data to display ____________________________________________  EKG   ____________________________________________  RADIOLOGY   ____________________________________________   PROCEDURES  Procedure(s) performed: INCISION AND DRAINAGE Performed by: Joni Reiningonald K Smith Consent: Verbal consent obtained. Risks and benefits: risks, benefits and alternatives were discussed Type: abscess  Body area: Left lower leg  Anesthesia: local infiltration  Incision was made with a scalpel.  Local anesthetic: lidocaine 1% without epinephrine  Anesthetic total: 3 ML's ml  Complexity: complex Blunt dissection to break up loculations  Drainage: purulent  Drainage amount: Small Packing material: 1/4 in iodoform gauze  Patient tolerance: Patient tolerated the procedure well with no immediate complications.  &  Procedures  Critical Care  performed: No  ____________________________________________   INITIAL IMPRESSION / ASSESSMENT AND PLAN / ED COURSE  Pertinent labs & imaging results that were available during my care of the patient were reviewed by me and considered in my medical decision making (see chart for details).  Abscess left lower leg. Patient given discharge care instructions. Patient given prescription for Percocet and clindamycin. Patient advised to follow-up 2 days for wound check.  Clinical Course      ____________________________________________   FINAL CLINICAL IMPRESSION(S) / ED DIAGNOSES  Final diagnoses:  Abscess      NEW MEDICATIONS STARTED DURING THIS VISIT:  New Prescriptions   CLINDAMYCIN (CLEOCIN) 150 MG CAPSULE    Take 1 capsule (150 mg total) by mouth 4 (four) times daily.   OXYCODONE-ACETAMINOPHEN (ROXICET) 5-325 MG TABLET    Take 1 tablet by mouth every 6 (six) hours as needed.     Note:  This document was prepared using Dragon voice recognition software and may include unintentional dictation errors.    Joni Reiningonald K Smith, PA-C 06/11/16 1033    Rockne MenghiniAnne-Caroline Norman, MD 06/11/16 [phone]

## 2016-10-04 ENCOUNTER — Emergency Department
Admission: EM | Admit: 2016-10-04 | Discharge: 2016-10-04 | Disposition: A | Discharge status: Home / Self Care | Payer: Medicaid Other | Attending: Student in an Organized Health Care Education/Training Program | Admitting: Student in an Organized Health Care Education/Training Program

## 2016-10-04 ENCOUNTER — Emergency Department: Payer: Medicaid Other

## 2016-10-04 DIAGNOSIS — R519 Headache, unspecified: Secondary | ICD-10-CM

## 2016-10-04 DIAGNOSIS — Z79899 Other long term (current) drug therapy: Secondary | ICD-10-CM | POA: Insufficient documentation

## 2016-10-04 DIAGNOSIS — R51 Headache: Secondary | ICD-10-CM | POA: Diagnosis present

## 2016-10-04 LAB — POCT PREGNANCY, URINE: Preg Test, Ur: NEGATIVE

## 2016-10-04 MED ORDER — MAGNESIUM SULFATE 2 GM/50ML IV SOLN
INTRAVENOUS | Status: AC
  Administered 2016-10-04: 2 g via INTRAVENOUS
  Filled 2016-10-04: qty 50

## 2016-10-04 MED ORDER — DIPHENHYDRAMINE HCL 50 MG/ML IJ SOLN
INTRAMUSCULAR | Status: AC
  Filled 2016-10-04: qty 1

## 2016-10-04 MED ORDER — PROCHLORPERAZINE EDISYLATE 5 MG/ML IJ SOLN
10.0000 mg | Freq: Once | INTRAMUSCULAR | Status: AC
  Administered 2016-10-04: 10 mg via INTRAVENOUS
  Filled 2016-10-04 (×2): qty 2

## 2016-10-04 MED ORDER — DEXAMETHASONE SODIUM PHOSPHATE 10 MG/ML IJ SOLN
INTRAMUSCULAR | Status: AC
  Administered 2016-10-04: 10 mg via INTRAVENOUS
  Filled 2016-10-04: qty 1

## 2016-10-04 MED ORDER — SODIUM CHLORIDE 0.9 % IV BOLUS (SEPSIS)
1000.0000 mL | Freq: Once | INTRAVENOUS | Status: AC
  Administered 2016-10-04: 1000 mL via INTRAVENOUS

## 2016-10-04 MED ORDER — DIPHENHYDRAMINE HCL 50 MG/ML IJ SOLN
25.0000 mg | Freq: Once | INTRAMUSCULAR | Status: AC
  Administered 2016-10-04: 25 mg via INTRAVENOUS

## 2016-10-04 MED ORDER — DEXAMETHASONE SODIUM PHOSPHATE 10 MG/ML IJ SOLN
10.0000 mg | Freq: Once | INTRAMUSCULAR | Status: AC
  Administered 2016-10-04: 10 mg via INTRAVENOUS

## 2016-10-04 MED ORDER — MAGNESIUM SULFATE 2 GM/50ML IV SOLN
2.0000 g | Freq: Once | INTRAVENOUS | Status: AC
  Administered 2016-10-04: 2 g via INTRAVENOUS

## 2016-10-04 NOTE — ED Triage Notes (Signed)
Pt presents to ED with c/o a generalized headache that started around 4am today. Pt reports taking "Tylenol, Ibuprofen, and BC powders" without relief. Pt denies h/x of migraines, (+) nausea, denies emesis. Pt w/o any c/o visual changes or photosensitivity.

## 2016-10-04 NOTE — Discharge Instructions (Signed)

## 2016-10-04 NOTE — ED Provider Notes (Signed)
[facility] Emergency Department Provider Note    First MD Initiated Contact with Patient 10/04/16 2144     (approximate)  I have reviewed the triage vital signs and the nursing notes.   HISTORY  Chief Complaint Headache    HPI Chelsea Vazquez is a 26 y.o. female presents with severe headache is generalized and started around 4:00 today. States she woke up this morning with headache. States that she took Tylenol as well as Motrin and BC powders without any improvement. States it was not sudden onset and has steadily worsened throughout the day. States that she did get some brief relief after taking some Motrin but it came back. Has had some associated nausea but no emesis. No previous history of headaches. Denies any blurry vision. No photosensitivity. Patient swelling in the ER and in no acute distress.   History reviewed. No pertinent past medical history. FMH: no family h/o easy bleeding History reviewed. No pertinent surgical history. There are no active problems to display for this patient.     Prior to Admission medications   Medication Sig Start Date End Date Taking? Authorizing Provider  clindamycin (CLEOCIN) 150 MG capsule Take 1 capsule (150 mg total) by mouth 4 (four) times daily. 06/11/16   Joni Reining, PA-C  ibuprofen (ADVIL,MOTRIN) 600 MG tablet Take 1 tablet (600 mg total) by mouth every 8 (eight) hours as needed. 06/02/16   Joni Reining, PA-C  metFORMIN (GLUCOPHAGE) 500 MG tablet Take 1 tablet (500 mg total) by mouth 2 (two) times daily with a meal. 09/07/15 09/06/16  Myrna Blazer, MD  metroNIDAZOLE (FLAGYL) 500 MG tablet Take 1 tablet (500 mg total) by mouth 2 (two) times daily. 07/31/15   Darien Ramus, MD  naproxen (NAPROSYN) 500 MG tablet Take 1 tablet (500 mg total) by mouth 2 (two) times daily with a meal. 06/08/15   Sharman Cheek, MD  oxyCODONE-acetaminophen (ROXICET) 5-325 MG tablet Take 1-2 tablets by mouth  every 4 (four) hours as needed for severe pain. 03/22/16   Evangeline Dakin, PA-C  oxyCODONE-acetaminophen (ROXICET) 5-325 MG tablet Take 1 tablet by mouth every 6 (six) hours as needed. 06/11/16 06/11/17  Joni Reining, PA-C  promethazine (PHENERGAN) 25 MG tablet Take 1 tablet (25 mg total) by mouth every 6 (six) hours as needed for nausea or vomiting. 06/02/16   Joni Reining, PA-C  sulfamethoxazole-trimethoprim (BACTRIM DS,SEPTRA DS) 800-160 MG tablet Take 1 tablet by mouth 2 (two) times daily. 03/20/16   Evon Slack, PA-C  sulfamethoxazole-trimethoprim (BACTRIM DS,SEPTRA DS) 800-160 MG tablet Take 1 tablet by mouth 2 (two) times daily. 06/02/16   Joni Reining, PA-C  traMADol (ULTRAM) 50 MG tablet Take 1 tablet (50 mg total) by mouth every 6 (six) hours as needed for moderate pain. 06/02/16   Joni Reining, PA-C    Allergies Amoxicillin    Social History Social History  Substance Use Topics  . Smoking status: Never Smoker  . Smokeless tobacco: Never Used  . Alcohol use No    Review of Systems Patient denies headaches, rhinorrhea, blurry vision, numbness, shortness of breath, chest pain, edema, cough, abdominal pain, nausea, vomiting, diarrhea, dysuria, fevers, rashes or hallucinations unless otherwise stated above in HPI. ____________________________________________   PHYSICAL EXAM:  VITAL SIGNS: Vitals:   10/04/16 2122  BP: 103/64  Pulse: 98  Resp: 18  Temp: 99.2 F (37.3 C)    Constitutional: Alert and oriented. Pleasant and smiling, well appearing  and in no acute distress. Eyes: Conjunctivae are normal. PERRL. EOMI. Head: Atraumatic. Nose: No congestion/rhinnorhea. Mouth/Throat: Mucous membranes are moist.  Oropharynx non-erythematous. Neck: No stridor. Painless ROM. No cervical spine tenderness to palpation Hematological/Lymphatic/Immunilogical: No cervical lymphadenopathy. Cardiovascular: Normal rate, regular rhythm. Grossly normal heart sounds.  Good  peripheral circulation. Respiratory: Normal respiratory effort.  No retractions. Lungs CTAB. Gastrointestinal: Soft and nontender. No distention. No abdominal bruits. No CVA tenderness.  Musculoskeletal: No lower extremity tenderness nor edema.  No joint effusions. Neurologic:  CN- intact.  No facial droop, Normal FNF.  Normal heel to shin.  Sensation intact bilaterally. Normal speech and language. No gross focal neurologic deficits are appreciated. No gait instability.  Skin:  Skin is warm, dry and intact. No rash noted. Psychiatric: Mood and affect are normal. Speech and behavior are normal.  ____________________________________________   LABS (all labs ordered are listed, but only abnormal results are displayed)  Results for orders placed or performed during the hospital encounter of 10/04/16 (from the past 24 hour(s))  Pregnancy, urine POC     Status: None   Collection Time: 10/04/16 10:11 PM  Result Value Ref Range   Preg Test, Ur NEGATIVE NEGATIVE   ____________________________________________ ____________________________________________  RADIOLOGY  I personally reviewed all radiographic images ordered to evaluate for the above acute complaints and reviewed radiology reports and findings.  These findings were personally discussed with the patient.  Please see medical record for radiology report.  ____________________________________________   PROCEDURES  Procedure(s) performed:  Procedures    Critical Care performed: no ____________________________________________   INITIAL IMPRESSION / ASSESSMENT AND PLAN / ED COURSE  Pertinent labs & imaging results that were available during my care of the patient were reviewed by me and considered in my medical decision making (see chart for details).  DDX tension,, cluster, migraine, sah, mass  Chelsea Vazquez is a 26 y.o. who presents to the ED with acute HA since this morning. Gradual onset.  Denies focal neurologic  symptoms. Denies trauma. No fevers or neck pain. No vision loss. Afebrile in ED. VSS. Exam as above. No meningeal signs. No CN, motor, sensory or cerebellar deficits. Temporal arteries palpable and non-tender. Appears well and non-toxic.  Will provide IV fluids for hydration and IV medications for symptom control.  Based on new onset headache worse this morning, will order CT head to eval for bleed or mass.  CT ehad with [facility].  Have low suspicion for [facility] and patient is negative according for [facility] rules.  Likely tension, non-specific or possible migraine HA. Clinical picture is not consistent with ICH, SAH, SDH, EDH, TIA, or CVA. No concern for meningitis or encephalitis. No concern for GCA/Temporal arteritis.  ----------------------------------------- 10:52 PM on 10/04/2016 -----------------------------------------   Pain improved. Repeat neuro exam is again without focal deficit, nuchal rigidity or evidence of meningeal irritation.  Stable to D/C home, follow up with PCP or Neurology if persistent recurrent Has.  Have discussed with the patient and available family all diagnostics and treatments performed thus far and all questions were answered to the best of my ability. The patient demonstrates understanding and agreement with plan.        ____________________________________________   FINAL CLINICAL IMPRESSION(S) / ED DIAGNOSES  Final diagnoses:  Acute nonintractable headache, unspecified headache type      NEW MEDICATIONS STARTED DURING THIS VISIT:  New Prescriptions   No medications on file     Note:  This document was prepared using Dragon voice recognition software and  may include unintentional dictation errors.    Willy EddyPatrick Daaiyah Baumert, MD 10/04/16 2253

## 2017-05-09 ENCOUNTER — Encounter: Payer: Self-pay | Admitting: Obstetrics and Gynecology

## 2017-05-09 DIAGNOSIS — F329 Major depressive disorder, single episode, unspecified: Secondary | ICD-10-CM | POA: Insufficient documentation

## 2017-05-09 DIAGNOSIS — O24419 Gestational diabetes mellitus in pregnancy, unspecified control: Secondary | ICD-10-CM | POA: Insufficient documentation

## 2017-05-09 DIAGNOSIS — Z8759 Personal history of other complications of pregnancy, childbirth and the puerperium: Secondary | ICD-10-CM | POA: Insufficient documentation

## 2017-05-09 DIAGNOSIS — F32A Depression, unspecified: Secondary | ICD-10-CM | POA: Insufficient documentation

## 2017-05-10 ENCOUNTER — Encounter: Payer: Self-pay | Admitting: Obstetrics and Gynecology

## 2017-05-28 ENCOUNTER — Encounter: Payer: Medicaid Other | Admitting: Obstetrics and Gynecology

## 2017-05-29 ENCOUNTER — Telehealth: Payer: Self-pay | Admitting: Obstetrics and Gynecology

## 2017-05-29 NOTE — Telephone Encounter (Signed)
-----   Message from Vena AustriaAndreas Staebler, MD sent at 05/28/2017  9:23 PM EST ----- Regarding: No further appointments The patient has rescheduled twice she can go to health department for confirmation visit but do not reschedule for any future appointment since she continues to no show

## 2017-09-26 ENCOUNTER — Encounter: Payer: Self-pay | Admitting: Emergency Medicine

## 2017-09-26 ENCOUNTER — Emergency Department
Admission: EM | Admit: 2017-09-26 | Discharge: 2017-09-26 | Disposition: A | Discharge status: Home / Self Care | Payer: Medicaid Other | Attending: Emergency Medicine | Admitting: Emergency Medicine

## 2017-09-26 DIAGNOSIS — R739 Hyperglycemia, unspecified: Secondary | ICD-10-CM | POA: Diagnosis not present

## 2017-09-26 DIAGNOSIS — E860 Dehydration: Secondary | ICD-10-CM | POA: Insufficient documentation

## 2017-09-26 LAB — BASIC METABOLIC PANEL
Anion gap: 11 (ref 5–15)
BUN: 9 mg/dL (ref 6–20)
CALCIUM: 9.8 mg/dL (ref 8.9–10.3)
CO2: 23 mmol/L (ref 22–32)
CREATININE: 0.67 mg/dL (ref 0.44–1.00)
Chloride: 97 mmol/L — ABNORMAL LOW (ref 101–111)
Glucose, Bld: 500 mg/dL — ABNORMAL HIGH (ref 65–99)
Potassium: 4.2 mmol/L (ref 3.5–5.1)
SODIUM: 131 mmol/L — AB (ref 135–145)

## 2017-09-26 LAB — URINALYSIS, COMPLETE (UACMP) WITH MICROSCOPIC
Bilirubin Urine: NEGATIVE
HGB URINE DIPSTICK: NEGATIVE
Ketones, ur: NEGATIVE mg/dL
NITRITE: NEGATIVE
PROTEIN: NEGATIVE mg/dL
Specific Gravity, Urine: 1.036 — ABNORMAL HIGH (ref 1.005–1.030)
pH: 5 (ref 5.0–8.0)

## 2017-09-26 LAB — POCT PREGNANCY, URINE: PREG TEST UR: NEGATIVE

## 2017-09-26 LAB — CBC
HCT: 48.2 % — ABNORMAL HIGH (ref 35.0–47.0)
Hemoglobin: 15.3 g/dL (ref 12.0–16.0)
MCH: 25.5 pg — AB (ref 26.0–34.0)
MCHC: 31.8 g/dL — ABNORMAL LOW (ref 32.0–36.0)
MCV: 80.1 fL (ref 80.0–100.0)
PLATELETS: 262 10*3/uL (ref 150–440)
RBC: 6.01 MIL/uL — ABNORMAL HIGH (ref 3.80–5.20)
RDW: 14.1 % (ref 11.5–14.5)
WBC: 11 10*3/uL (ref 3.6–11.0)

## 2017-09-26 LAB — GLUCOSE, CAPILLARY
Glucose-Capillary: 494 mg/dL — ABNORMAL HIGH (ref 65–99)
Glucose-Capillary: 486 mg/dL — ABNORMAL HIGH (ref 65–99)
Glucose-Capillary: 213 mg/dL — ABNORMAL HIGH (ref 65–99)

## 2017-09-26 MED ORDER — METFORMIN HCL 500 MG PO TABS
500.0000 mg | ORAL_TABLET | Freq: Once | ORAL | Status: AC
  Administered 2017-09-26: 500 mg via ORAL
  Filled 2017-09-26 (×2): qty 1

## 2017-09-26 MED ORDER — SODIUM CHLORIDE 0.9 % IV SOLN
Freq: Once | INTRAVENOUS | Status: AC
  Administered 2017-09-26: 16:00:00 via INTRAVENOUS

## 2017-09-26 MED ORDER — INSULIN ASPART 100 UNIT/ML ~~LOC~~ SOLN
5.0000 [IU] | Freq: Once | SUBCUTANEOUS | Status: AC
Start: 2017-09-26 — End: 2017-09-26
  Administered 2017-09-26: 5 [IU] via SUBCUTANEOUS
  Filled 2017-09-26: qty 0.05
  Filled 2017-09-26: qty 1

## 2017-09-26 MED ORDER — METFORMIN HCL 500 MG PO TABS: 500.0000 mg | ORAL_TABLET | Freq: Two times a day (BID) | ORAL | 11 refills | Status: DC

## 2017-09-26 MED ORDER — INSULIN ASPART 100 UNIT/ML ~~LOC~~ SOLN
10.0000 [IU] | Freq: Once | SUBCUTANEOUS | Status: AC
  Administered 2017-09-26: 10 [IU] via SUBCUTANEOUS
  Filled 2017-09-26: qty 1

## 2017-09-26 NOTE — ED Provider Notes (Addendum)
[facility] Emergency Department Provider Note       Time seen: ----------------------------------------- 4:13 PM on 09/26/2017 -----------------------------------------   I have reviewed the triage vital signs and the nursing notes.  HISTORY   Chief Complaint Hyperglycemia    HPI Chelsea Vazquez is a 27 y.o. female with a history of depression, gestational diabetes and likely type 2 diabetes, migraines who presents to the ED for hyperglycemia.  Patient states she is not diabetic but she did have gestational diabetes 3 years ago with her pregnancy.  She states she has had frequent urination as well as frequent thirst, headaches and blurry vision.  She had her blood sugar checked at her friend's house and it was read as high.  She is not currently pregnant, denies fevers, chills or other complaints.  She does have significant weakness and fatigue.  Past Medical History:  Diagnosis Date  . Depression   . GDM (gestational diabetes mellitus)    fourth pregnancy  . History of chlamydia 2011  . History of IUFD    D/t Abruption HELLP  . Migraines   . Premature labor 11/29/2011   35 weeks  . Pyelonephritis 2011    Patient Active Problem List   Diagnosis Date Noted  . GDM (gestational diabetes mellitus)   . History of IUFD   . Depression   . Premature labor 11/29/2011  . History of chlamydia 07/09/2009  . Pyelonephritis 07/09/2009    Past Surgical History:  Procedure Laterality Date  . tubal ligation  10/10/2014   [facility]    Allergies Amoxicillin  Social History Social History   Tobacco Use  . Smoking status: Never Smoker  . Smokeless tobacco: Never Used  Substance Use Topics  . Alcohol use: No  . Drug use: No    Review of Systems Constitutional: Negative for fever. Eyes: Positive for blurry vision ENT:  Negative for congestion, sore throat, positive for polydipsia Cardiovascular: Negative for chest pain. Respiratory: Negative  for shortness of breath. Gastrointestinal: Negative for abdominal pain, vomiting and diarrhea. Genitourinary: Positive for polyuria Musculoskeletal: Negative for back pain. Skin: Negative for rash. Neurological: Negative for headaches, positive for generalized weakness  All systems negative/normal/unremarkable except as stated in the HPI  ____________________________________________   PHYSICAL EXAM:  VITAL SIGNS: ED Triage Vitals  Enc Vitals Group     BP 09/26/17 1342 (!) 128/92     Pulse Rate 09/26/17 1342 (!) 111     Resp 09/26/17 1342 15     Temp 09/26/17 1342 98 F (36.7 C)     Temp Source 09/26/17 1342 Oral     SpO2 09/26/17 1342 94 %     Weight 09/26/17 1343 105 lb (47.6 kg)     Height 09/26/17 1343 5' (1.524 m)     Head Circumference --      Peak Flow --      Pain Score 09/26/17 1343 10     Pain Loc --      Pain Edu? --      Excl. in GC? --    Constitutional: Alert and oriented.  Mild distress Eyes: Conjunctivae are normal. Normal extraocular movements. ENT   Head: Normocephalic and atraumatic.   Nose: No congestion/rhinnorhea.   Mouth/Throat: Mucous membranes are slightly dry   Neck: No stridor. Cardiovascular: Normal rate, regular rhythm. No murmurs, rubs, or gallops. Respiratory: Normal respiratory effort without tachypnea nor retractions. Breath sounds are clear and equal bilaterally. No wheezes/rales/rhonchi. Gastrointestinal: Soft and nontender. Normal bowel sounds  Musculoskeletal: Nontender with normal range of motion in extremities. No lower extremity tenderness nor edema. Neurologic:  Normal speech and language. No gross focal neurologic deficits are appreciated.  Skin:  Skin is warm, dry and intact. No rash noted. Psychiatric: Mood and affect are normal. Speech and behavior are normal.  ____________________________________________  ED COURSE:  As part of my medical decision making, I reviewed the following data within the electronic  MEDICAL RECORD NUMBER History obtained from family if available, nursing notes, old chart and ekg, as well as notes from prior ED visits. Patient presented for hyperglycemia, we will assess with labs and give IV fluids with subcutaneous insulin   Procedures ____________________________________________   LABS (pertinent positives/negatives)  Labs Reviewed  BASIC METABOLIC PANEL - Abnormal; Notable for the following components:      Result Value   Sodium 131 (*)    Chloride 97 (*)    Glucose, Bld 500 (*)    All other components within normal limits  CBC - Abnormal; Notable for the following components:   RBC 6.01 (*)    HCT 48.2 (*)    MCH 25.5 (*)    MCHC 31.8 (*)    All other components within normal limits  URINALYSIS, COMPLETE (UACMP) WITH MICROSCOPIC - Abnormal; Notable for the following components:   Color, Urine YELLOW (*)    APPearance CLOUDY (*)    Specific Gravity, Urine 1.036 (*)    Glucose, UA >=500 (*)    Leukocytes, UA SMALL (*)    Bacteria, UA RARE (*)    Squamous Epithelial / LPF TOO NUMEROUS TO COUNT (*)    All other components within normal limits  GLUCOSE, CAPILLARY - Abnormal; Notable for the following components:   Glucose-Capillary 494 (*)    All other components within normal limits  GLUCOSE, CAPILLARY - Abnormal; Notable for the following components:   Glucose-Capillary 486 (*)    All other components within normal limits  GLUCOSE, CAPILLARY - Abnormal; Notable for the following components:   Glucose-Capillary 213 (*)    All other components within normal limits  CBG MONITORING, ED  POC URINE PREG, ED  POCT PREGNANCY, URINE  ____________________________________________  DIFFERENTIAL DIAGNOSIS   Hyperglycemia, dehydration, electrolyte abnormality, DKA  FINAL ASSESSMENT AND PLAN  Hyperglycemia, dehydration   Plan: The patient had presented for symptoms reflective of underlying diabetes that has been untreated for some time.  Looking at her previous  charts she has been diabetic for at least the last 2 years.  Patient's labs did not reveal a DKA but her blood sugars are 500.  She may possibly have a UTI as well.  Here she was given fluids as well as insulin and started on metformin with subsequent improvement in her blood sugars down to 213.  I have advised that she must follow-up without fail.   Ulice DashJohnathan E Sun Wilensky, MD   Note: This note was generated in part or whole with voice recognition software. Voice recognition is usually quite accurate but there are transcription errors that can and very often do occur. I apologize for any typographical errors that were not detected and corrected.     Emily FilbertWilliams, Jahiem Franzoni E, MD 09/26/17 1616    Emily FilbertWilliams, Averill Pons E, MD 09/26/17 1910

## 2017-09-26 NOTE — ED Notes (Signed)
Pt reports pain in both legs and feet that feels like pins and needles. MD and RN educated pt on neuropathy. Pt verbalized understanding.

## 2017-09-26 NOTE — ED Triage Notes (Signed)
Pt comes into the ED via POV c/o possibility of having high sugar.  Patient is not diabetic but did have gestational diabetes 2 years ago with her pregnancy.  Patient states she has had increased urination and headaches so she checked her sugars at a friends house and it read that it was high.  Patient is not currently pregnant, and has no other known h/o hyperglycemia.  Patient in NAD at this time with even and unlabored respirations.

## 2017-09-29 LAB — URINE CULTURE
Culture: >=100000 — AB
Special Requests: NORMAL

## 2017-09-30 NOTE — Progress Notes (Addendum)
ED Antimicrobial Stewardship Positive Culture Follow Up   Chelsea Vazquez is an 27 y.o. female who presented to [facility] on 09/26/2017 with a chief complaint of  Chief Complaint  Patient presents with  . Hyperglycemia    Recent Results (from the past 720 hour(s))  Urine culture     Status: Abnormal   Collection Time: 09/26/17  7:11 PM  Result Value Ref Range Status   Specimen Description   Final    URINE, CLEAN CATCH Performed at [facility], [address] [place], [place] [address]    Special Requests   Final    Normal Performed at [facility], [address] [place], [place] [address]    Culture >=100,000 COLONIES/mL ESCHERICHIA COLI (A)  Final   Report Status 09/29/2017 FINAL  Final   Organism ID, Bacteria ESCHERICHIA COLI (A)  Final      Susceptibility   Escherichia coli - MIC*    AMPICILLIN >=32 RESISTANT Resistant     CEFAZOLIN <=4 SENSITIVE Sensitive     CEFTRIAXONE <=1 SENSITIVE Sensitive     CIPROFLOXACIN <=0.25 SENSITIVE Sensitive     GENTAMICIN <=1 SENSITIVE Sensitive     IMIPENEM <=0.25 SENSITIVE Sensitive     NITROFURANTOIN <=16 SENSITIVE Sensitive     TRIMETH/SULFA <=20 SENSITIVE Sensitive     AMPICILLIN/SULBACTAM 4 SENSITIVE Sensitive     PIP/TAZO <=4 SENSITIVE Sensitive     Extended ESBL NEGATIVE Sensitive     * >=100,000 COLONIES/mL ESCHERICHIA COLI    []  Treated with none, organism resistant to prescribed antimicrobial []  Patient discharged originally without antimicrobial agent and treatment is now indicated  New antibiotic prescription: nitrofurantoin 100mg  po bid x 5 days   ED Provider: Dr Don PerkingVeronese  3/25@1400 ; Unable to reach Ms Reymundo PollHatfield, called her cell phone listed [phone], her VM mailbox is not set up to leave a message, will call again in some time.  3/26@1630  Spoke with Brandt Loosenekeyla, she [facility] [address]. [facility], [place].    Gerre PebblesGarrett Talli Kimmer 09/30/2017,  1:57 PM  Luan PullingGarrett Dorea Duff, PharmD, MBA, Liz ClaiborneBCGP Clinical Pharmacist [facility]

## 2017-12-13 ENCOUNTER — Encounter: Payer: Self-pay | Admitting: Obstetrics and Gynecology

## 2019-02-22 ENCOUNTER — Encounter: Payer: Self-pay | Admitting: Intensive Care

## 2019-02-22 ENCOUNTER — Other Ambulatory Visit: Payer: Self-pay

## 2019-02-22 ENCOUNTER — Emergency Department
Admission: EM | Admit: 2019-02-22 | Discharge: 2019-02-22 | Disposition: A | Discharge status: Home / Self Care | Payer: Medicaid Other | Attending: Emergency Medicine | Admitting: Emergency Medicine

## 2019-02-22 DIAGNOSIS — L02415 Cutaneous abscess of right lower limb: Secondary | ICD-10-CM | POA: Insufficient documentation

## 2019-02-22 DIAGNOSIS — L0291 Cutaneous abscess, unspecified: Secondary | ICD-10-CM | POA: Diagnosis present

## 2019-02-22 MED ORDER — HYDROCODONE-ACETAMINOPHEN 5-325 MG PO TABS: 1.0000 | ORAL_TABLET | Freq: Four times a day (QID) | ORAL | 0 refills | Status: DC | PRN

## 2019-02-22 MED ORDER — SULFAMETHOXAZOLE-TRIMETHOPRIM 800-160 MG PO TABS: 1.0000 | ORAL_TABLET | Freq: Two times a day (BID) | ORAL | 0 refills | Status: DC

## 2019-02-22 NOTE — ED Triage Notes (Signed)
Patient c/o boil/abscess for a couple of days on right inner thigh. Yellow pus present in abscess. Denies drainage

## 2019-02-22 NOTE — Discharge Instructions (Signed)
Follow-up with your primary care provider this week if any continued problems.  Begin taking antibiotics as directed twice a day for the next 10 days.  Also pain medication only if you need it every 6 hours with food.  Do not take this medication and drive or operate machinery as it may cause increased chances of your injury.  Use warm compresses to the area frequently.

## 2019-02-22 NOTE — ED Provider Notes (Signed)
[facility] Emergency Department Provider Note  ____________________________________________   First MD Initiated Contact with Patient 02/22/19 1242     (approximate)  I have reviewed the triage vital signs and the nursing notes.   HISTORY  Chief Complaint Abscess   HPI Serina Cowperekeyla E Alcaraz is a 28 y.o. female presents with complaint of abscess to her right inner thigh for several days.  Patient states she has been using warm compresses to the area.  She denies any fever or chills.  She complains of pain in the area.  She rates her pain as a 10 out of 10.     Past Medical History:  Diagnosis Date  . Depression   . GDM (gestational diabetes mellitus)    fourth pregnancy  . History of chlamydia 2011  . History of IUFD    D/t Abruption HELLP  . Migraines   . Premature labor 11/29/2011   35 weeks  . Pyelonephritis 2011    Patient Active Problem List   Diagnosis Date Noted  . GDM (gestational diabetes mellitus)   . History of IUFD   . Depression   . Premature labor 11/29/2011  . History of chlamydia 07/09/2009  . Pyelonephritis 07/09/2009    Past Surgical History:  Procedure Laterality Date  . tubal ligation  10/10/2014   [facility]    Prior to Admission medications   Medication Sig Start Date End Date Taking? Authorizing Provider  HYDROcodone-acetaminophen (NORCO/VICODIN) 5-325 MG tablet Take 1 tablet by mouth every 6 (six) hours as needed for moderate pain. 02/22/19   Tommi RumpsSummers, Rhonda L, PA-C  metFORMIN (GLUCOPHAGE) 500 MG tablet Take 1 tablet (500 mg total) by mouth 2 (two) times daily with a meal. 09/07/15 09/06/16  Schaevitz, Myra Rudeavid Matthew, MD  metFORMIN (GLUCOPHAGE) 500 MG tablet Take 1 tablet (500 mg total) by mouth 2 (two) times daily with a meal. 09/26/17 09/26/18  Emily FilbertWilliams, Jonathan E, MD  sulfamethoxazole-trimethoprim (BACTRIM DS) 800-160 MG tablet Take 1 tablet by mouth 2 (two) times daily. 02/22/19   Tommi RumpsSummers, Rhonda L, PA-C  promethazine  (PHENERGAN) 25 MG tablet Take 1 tablet (25 mg total) by mouth every 6 (six) hours as needed for nausea or vomiting. 06/02/16 02/22/19  Joni ReiningSmith, Ronald K, PA-C    Allergies Amoxicillin and Naproxen  Family History  Problem Relation Age of Onset  . Hypertension Mother   . Hypertension Father   . Diabetes Paternal Grandmother        Type 1  . Sickle cell trait Other     Social History Social History   Tobacco Use  . Smoking status: Never Smoker  . Smokeless tobacco: Never Used  Substance Use Topics  . Alcohol use: No  . Drug use: No    Review of Systems Constitutional: No fever/chills Cardiovascular: Denies chest pain. Respiratory: Denies shortness of breath. Gastrointestinal:   No nausea, no vomiting. Musculoskeletal: Negative for muscle aches. Skin: Positive for skin abscess. Neurological: Negative for headaches, focal weakness or numbness. ____________________________________________   PHYSICAL EXAM:  VITAL SIGNS: ED Triage Vitals  Enc Vitals Group     BP 02/22/19 1221 (!) 128/92     Pulse Rate 02/22/19 1221 100     Resp 02/22/19 1221 14     Temp 02/22/19 1221 99 F (37.2 C)     Temp Source 02/22/19 1221 Oral     SpO2 02/22/19 1221 100 %     Weight 02/22/19 1222 105 lb (47.6 kg)     Height 02/22/19 1222  5' (1.524 m)     Head Circumference --      Peak Flow --      Pain Score 02/22/19 1222 10     Pain Loc --      Pain Edu? --      Excl. in [place]? --    Constitutional: Alert and oriented. Well appearing and in no acute distress. Eyes: Conjunctivae are normal.  Head: Atraumatic. Neck: No stridor.   Cardiovascular: Normal rate, regular rhythm. Grossly normal heart sounds.  Good peripheral circulation. Respiratory: Normal respiratory effort.  No retractions. Lungs CTAB. Musculoskeletal: Moves upper and lower extremities with any difficulty normal gait was noted. Neurologic:  Normal speech and language. No gross focal neurologic deficits are appreciated. No gait  instability. Skin:  Skin is warm, dry and intact.  There is an area on the inner right thigh that measures approximately 1/2 cm that is firm to palpation.  No erythema or drainage at this time.  Skin has peeled  in this area to suggest that there had been some edema in the past.  No active drainage. Psychiatric: Mood and affect are normal. Speech and behavior are normal.  ____________________________________________   LABS (all labs ordered are listed, but only abnormal results are displayed)  Labs Reviewed - No data to display  PROCEDURES  Procedure(s) performed (including Critical Care):  Procedures   ____________________________________________   INITIAL IMPRESSION / ASSESSMENT AND PLAN / ED COURSE  As part of my medical decision making, I reviewed the following data within the electronic MEDICAL RECORD NUMBER Notes from prior ED visits and [place] Controlled Substance Database  28 year old female presents to the ED with complaint of abscess to her right inner thigh that has been there for several days.  Patient has been using compresses to the area.  Area is extremely tender to palpation.  No fluctuance was noted.  No surrounding cellulitis.  Patient was discharged with prescription for Bactrim DS twice daily for 10 days and Norco every 6 hours as needed for severe pain.  She is to continue with warm compresses and to follow-up with her PCP if any continued problems.  ____________________________________________   FINAL CLINICAL IMPRESSION(S) / ED DIAGNOSES  Final diagnoses:  Abscess of right thigh     ED Discharge Orders         Ordered    sulfamethoxazole-trimethoprim (BACTRIM DS) 800-160 MG tablet  2 times daily     02/22/19 1256    HYDROcodone-acetaminophen (NORCO/VICODIN) 5-325 MG tablet  Every 6 hours PRN     02/22/19 1256           Note:  This document was prepared using Dragon voice recognition software and may include unintentional dictation errors.    Johnn Hai, PA-C 02/22/19 1759    Lavonia Drafts, MD 02/22/19 [phone]

## 2019-06-08 ENCOUNTER — Other Ambulatory Visit: Payer: Self-pay

## 2019-06-09 ENCOUNTER — Other Ambulatory Visit: Payer: Self-pay

## 2019-08-04 ENCOUNTER — Other Ambulatory Visit: Payer: Self-pay

## 2019-08-04 ENCOUNTER — Encounter: Payer: Self-pay | Admitting: Emergency Medicine

## 2019-08-04 ENCOUNTER — Emergency Department
Admission: EM | Admit: 2019-08-04 | Discharge: 2019-08-04 | Disposition: A | Discharge status: Home / Self Care | Payer: Medicaid Other | Attending: Student in an Organized Health Care Education/Training Program | Admitting: Student in an Organized Health Care Education/Training Program

## 2019-08-04 DIAGNOSIS — Z8632 Personal history of gestational diabetes: Secondary | ICD-10-CM | POA: Insufficient documentation

## 2019-08-04 DIAGNOSIS — Z7984 Long term (current) use of oral hypoglycemic drugs: Secondary | ICD-10-CM | POA: Diagnosis not present

## 2019-08-04 DIAGNOSIS — R739 Hyperglycemia, unspecified: Secondary | ICD-10-CM | POA: Insufficient documentation

## 2019-08-04 DIAGNOSIS — R350 Frequency of micturition: Secondary | ICD-10-CM | POA: Diagnosis not present

## 2019-08-04 LAB — URINALYSIS, COMPLETE (UACMP) WITH MICROSCOPIC
Bilirubin Urine: NEGATIVE
Glucose, UA: >=500 mg/dL — AB
Hgb urine dipstick: NEGATIVE
Ketones, ur: NEGATIVE mg/dL
Leukocytes,Ua: NEGATIVE
Nitrite: NEGATIVE
Protein, ur: NEGATIVE mg/dL
Specific Gravity, Urine: 1.036 — ABNORMAL HIGH (ref 1.005–1.030)
pH: 6 (ref 5.0–8.0)

## 2019-08-04 LAB — BASIC METABOLIC PANEL
Anion gap: 10 (ref 5–15)
BUN: 9 mg/dL (ref 6–20)
CO2: 22 mmol/L (ref 22–32)
Calcium: 9 mg/dL (ref 8.9–10.3)
Chloride: 98 mmol/L (ref 98–111)
Creatinine, Ser: 0.52 mg/dL (ref 0.44–1.00)
GFR calc Af Amer: >60 mL/min (ref >60)
GFR calc non Af Amer: >60 mL/min (ref >60)
Glucose, Bld: 463 mg/dL — ABNORMAL HIGH (ref 70–99)
Potassium: 4.1 mmol/L (ref 3.5–5.1)
Sodium: 130 mmol/L — ABNORMAL LOW (ref 135–145)

## 2019-08-04 LAB — POCT PREGNANCY, URINE: Preg Test, Ur: NEGATIVE

## 2019-08-04 LAB — GLUCOSE, CAPILLARY
Glucose-Capillary: 424 mg/dL — ABNORMAL HIGH (ref 70–99)
Glucose-Capillary: 286 mg/dL — ABNORMAL HIGH (ref 70–99)

## 2019-08-04 LAB — CBC
HCT: 37.7 % (ref 36.0–46.0)
Hemoglobin: 11.9 g/dL — ABNORMAL LOW (ref 12.0–15.0)
MCH: 24.9 pg — ABNORMAL LOW (ref 26.0–34.0)
MCHC: 31.6 g/dL (ref 30.0–36.0)
MCV: 78.9 fL — ABNORMAL LOW (ref 80.0–100.0)
Platelets: 278 10*3/uL (ref 150–400)
RBC: 4.78 MIL/uL (ref 3.87–5.11)
RDW: 13.4 % (ref 11.5–15.5)
WBC: 10.6 10*3/uL — ABNORMAL HIGH (ref 4.0–10.5)
nRBC: 0 % (ref 0.0–0.2)

## 2019-08-04 MED ORDER — INSULIN ASPART 100 UNIT/ML ~~LOC~~ SOLN
6.0000 [IU] | Freq: Once | SUBCUTANEOUS | Status: AC
  Administered 2019-08-04: 16:00:00 6 [IU] via INTRAVENOUS
  Filled 2019-08-04: qty 1

## 2019-08-04 MED ORDER — SODIUM CHLORIDE 0.9 % IV BOLUS
1000.0000 mL | Freq: Once | INTRAVENOUS | Status: AC
  Administered 2019-08-04: 16:00:00 1000 mL via INTRAVENOUS

## 2019-08-04 MED ORDER — METFORMIN HCL 850 MG PO TABS: 850.0000 mg | ORAL_TABLET | Freq: Two times a day (BID) | ORAL | 0 refills | Status: DC

## 2019-08-04 NOTE — ED Provider Notes (Signed)
[facility] Emergency Department Provider Note    First MD Initiated Contact with Patient 08/04/19 1503     (approximate)  I have reviewed the triage vital signs and the nursing notes.   HISTORY  Chief Complaint Hyperglycemia    HPI Chelsea Vazquez is a 29 y.o. female below listed past medical history on 5 mg Metformin daily presents to ER for persistent hypoglycemia measured in the 400s for past week or 2.  Is endorsing increased urinary frequency.  Denies any abdominal pain chest pain or shortness of breath.  States that she been compliant with her medications.  Denies any dysuria or flank pain.    Past Medical History:  Diagnosis Date  . Depression   . GDM (gestational diabetes mellitus)    fourth pregnancy  . History of chlamydia 2011  . History of IUFD    D/t Abruption HELLP  . Migraines   . Premature labor 11/29/2011   35 weeks  . Pyelonephritis 2011   Family History  Problem Relation Age of Onset  . Hypertension Mother   . Hypertension Father   . Diabetes Paternal Grandmother        Type 1  . Sickle cell trait Other    Past Surgical History:  Procedure Laterality Date  . tubal ligation  10/10/2014   [facility]   Patient Active Problem List   Diagnosis Date Noted  . GDM (gestational diabetes mellitus)   . History of IUFD   . Depression   . Premature labor 11/29/2011  . History of chlamydia 07/09/2009  . Pyelonephritis 07/09/2009      Prior to Admission medications   Medication Sig Start Date End Date Taking? Authorizing Provider  HYDROcodone-acetaminophen (NORCO/VICODIN) 5-325 MG tablet Take 1 tablet by mouth every 6 (six) hours as needed for moderate pain. 02/22/19   Tommi Rumps, PA-C  metFORMIN (GLUCOPHAGE) 850 MG tablet Take 1 tablet (850 mg total) by mouth 2 (two) times daily with a meal. 08/04/19 08/03/20  Willy Eddy, MD  sulfamethoxazole-trimethoprim (BACTRIM DS) 800-160 MG tablet Take 1 tablet by mouth 2  (two) times daily. 02/22/19   Tommi Rumps, PA-C  promethazine (PHENERGAN) 25 MG tablet Take 1 tablet (25 mg total) by mouth every 6 (six) hours as needed for nausea or vomiting. 06/02/16 02/22/19  Joni Reining, PA-C    Allergies Amoxicillin, Naproxen, and Tylenol [acetaminophen]    Social History Social History   Tobacco Use  . Smoking status: Never Smoker  . Smokeless tobacco: Never Used  Substance Use Topics  . Alcohol use: No  . Drug use: No    Review of Systems Patient denies headaches, rhinorrhea, blurry vision, numbness, shortness of breath, chest pain, edema, cough, abdominal pain, nausea, vomiting, diarrhea, dysuria, fevers, rashes or hallucinations unless otherwise stated above in HPI. ____________________________________________   PHYSICAL EXAM:  VITAL SIGNS: Vitals:   08/04/19 1427  BP: 111/69  Pulse: (!) 115  Resp: 20  Temp: 98.5 F (36.9 C)  SpO2: 99%    Constitutional: Alert and oriented.  Eyes: Conjunctivae are normal.  Head: Atraumatic. Nose: No congestion/rhinnorhea. Mouth/Throat: Mucous membranes are moist.   Neck: No stridor. Painless ROM.  Cardiovascular: Normal rate, regular rhythm. Grossly normal heart sounds.  Good peripheral circulation. Respiratory: Normal respiratory effort.  No retractions. Lungs CTAB. Gastrointestinal: Soft and nontender. No distention. No abdominal bruits. No CVA tenderness. Genitourinary:  Musculoskeletal: No lower extremity tenderness nor edema.  No joint effusions. Neurologic:  Normal speech and  language. No gross focal neurologic deficits are appreciated. No facial droop Skin:  Skin is warm, dry and intact. No rash noted. Psychiatric: Mood and affect are normal. Speech and behavior are normal.  ____________________________________________   LABS (all labs ordered are listed, but only abnormal results are displayed)  Results for orders placed or performed during the hospital encounter of 08/04/19 (from  the past 24 hour(s))  Basic metabolic panel     Status: Abnormal   Collection Time: 08/04/19  2:28 PM  Result Value Ref Range   Sodium 130 (L) 135 - 145 mmol/L   Potassium 4.1 3.5 - 5.1 mmol/L   Chloride 98 98 - 111 mmol/L   CO2 22 22 - 32 mmol/L   Glucose, Bld 463 (H) 70 - 99 mg/dL   BUN 9 6 - 20 mg/dL   Creatinine, Ser 3.53 0.44 - 1.00 mg/dL   Calcium 9.0 8.9 - 61.4 mg/dL   GFR calc non Af Amer >60 >60 mL/min   GFR calc Af Amer >60 >60 mL/min   Anion gap 10 5 - 15  CBC     Status: Abnormal   Collection Time: 08/04/19  2:28 PM  Result Value Ref Range   WBC 10.6 (H) 4.0 - 10.5 K/uL   RBC 4.78 3.87 - 5.11 MIL/uL   Hemoglobin 11.9 (L) 12.0 - 15.0 g/dL   HCT 43.1 54.0 - 08.6 %   MCV 78.9 (L) 80.0 - 100.0 fL   MCH 24.9 (L) 26.0 - 34.0 pg   MCHC 31.6 30.0 - 36.0 g/dL   RDW 76.1 95.0 - 93.2 %   Platelets 278 150 - 400 K/uL   nRBC 0.0 0.0 - 0.2 %  Urinalysis, Complete w Microscopic     Status: Abnormal   Collection Time: 08/04/19  2:28 PM  Result Value Ref Range   Color, Urine STRAW (A) YELLOW   APPearance CLEAR (A) CLEAR   Specific Gravity, Urine 1.036 (H) 1.005 - 1.030   pH 6.0 5.0 - 8.0   Glucose, UA >=500 (A) NEGATIVE mg/dL   Hgb urine dipstick NEGATIVE NEGATIVE   Bilirubin Urine NEGATIVE NEGATIVE   Ketones, ur NEGATIVE NEGATIVE mg/dL   Protein, ur NEGATIVE NEGATIVE mg/dL   Nitrite NEGATIVE NEGATIVE   Leukocytes,Ua NEGATIVE NEGATIVE   RBC / HPF 0-5 0 - 5 RBC/hpf   WBC, UA 0-5 0 - 5 WBC/hpf   Bacteria, UA FEW (A) NONE SEEN   Squamous Epithelial / LPF 0-5 0 - 5  Glucose, capillary     Status: Abnormal   Collection Time: 08/04/19  2:37 PM  Result Value Ref Range   Glucose-Capillary 424 (H) 70 - 99 mg/dL  Pregnancy, urine POC     Status: None   Collection Time: 08/04/19  2:41 PM  Result Value Ref Range   Preg Test, Ur NEGATIVE NEGATIVE   ____________________________________________  EKG____________________________________________  RADIOLOGY  I personally reviewed  all radiographic images ordered to evaluate for the above acute complaints and reviewed radiology reports and findings.  These findings were personally discussed with the patient.  Please see medical record for radiology report.  ____________________________________________   PROCEDURES  Procedure(s) performed:  Procedures    Critical Care performed: no ____________________________________________   INITIAL IMPRESSION / ASSESSMENT AND PLAN / ED COURSE  Pertinent labs & imaging results that were available during my care of the patient were reviewed by me and considered in my medical decision making (see chart for details).   DDX: dehydration, dka, hhs, hyperglycemia,  medication noncompliance  Chelsea Vazquez is a 29 y.o. who presents to the ED with symptoms as described above.  Patient nontoxic-appearing in no acute distress.  Is mildly hyperglycemic.  Given IV fluids as well as bolus of insulin.  Do think we have room to increase her Metformin.  She does not have any signs of metabolic derangement such as DKA.  Clinically she is well-appearing is not consistent with HHS.  She is tolerating p.o.  She is not pregnant.  No signs of infectious process.  Will increase Metformin have her follow-up with PCP.  Discussed signs and symptoms for which he should return to the ER.     The patient was evaluated in Emergency Department today for the symptoms described in the history of present illness. He/she was evaluated in the context of the global COVID-19 pandemic, which necessitated consideration that the patient might be at risk for infection with the SARS-CoV-2 virus that causes COVID-19. Institutional protocols and algorithms that pertain to the evaluation of patients at risk for COVID-19 are in a state of rapid change based on information released by regulatory bodies including the CDC and federal and state organizations. These policies and algorithms were followed during the patient's care  in the ED.  As part of my medical decision making, I reviewed the following data within the [place] notes reviewed and incorporated, Labs reviewed, notes from prior ED visits and [place] Controlled Substance Database   ____________________________________________   FINAL CLINICAL IMPRESSION(S) / ED DIAGNOSES  Final diagnoses:  Hyperglycemia      NEW MEDICATIONS STARTED DURING THIS VISIT:  Current Discharge Medication List       Note:  This document was prepared using Dragon voice recognition software and may include unintentional dictation errors.    Merlyn Lot, MD 08/04/19 [phone]

## 2019-08-04 NOTE — ED Notes (Signed)
CBG 286 reported to [facility].

## 2019-08-04 NOTE — ED Triage Notes (Signed)
Pt reports blood sugar in the 500's for the last 2 days even though she is taking her meds.

## 2019-08-14 ENCOUNTER — Emergency Department
Admission: EM | Admit: 2019-08-14 | Discharge: 2019-08-14 | Disposition: A | Discharge status: Home / Self Care | Payer: Medicaid Other | Attending: Emergency Medicine | Admitting: Emergency Medicine

## 2019-08-14 ENCOUNTER — Other Ambulatory Visit: Payer: Self-pay

## 2019-08-14 ENCOUNTER — Encounter: Payer: Self-pay | Admitting: Intensive Care

## 2019-08-14 DIAGNOSIS — Z7984 Long term (current) use of oral hypoglycemic drugs: Secondary | ICD-10-CM | POA: Insufficient documentation

## 2019-08-14 DIAGNOSIS — R739 Hyperglycemia, unspecified: Secondary | ICD-10-CM | POA: Insufficient documentation

## 2019-08-14 DIAGNOSIS — R202 Paresthesia of skin: Secondary | ICD-10-CM | POA: Diagnosis not present

## 2019-08-14 DIAGNOSIS — R200 Anesthesia of skin: Secondary | ICD-10-CM | POA: Diagnosis present

## 2019-08-14 DIAGNOSIS — Z79899 Other long term (current) drug therapy: Secondary | ICD-10-CM | POA: Diagnosis not present

## 2019-08-14 LAB — CBC WITH DIFFERENTIAL/PLATELET
Abs Immature Granulocytes: 0.03 10*3/uL (ref 0.00–0.07)
Basophils Absolute: 0.1 10*3/uL (ref 0.0–0.1)
Basophils Relative: 1 %
Eosinophils Absolute: 0.1 10*3/uL (ref 0.0–0.5)
Eosinophils Relative: 1 %
HCT: 37.4 % (ref 36.0–46.0)
Hemoglobin: 12 g/dL (ref 12.0–15.0)
Immature Granulocytes: 0 %
Lymphocytes Relative: 28 %
Lymphs Abs: 2.8 10*3/uL (ref 0.7–4.0)
MCH: 24.9 pg — ABNORMAL LOW (ref 26.0–34.0)
MCHC: 32.1 g/dL (ref 30.0–36.0)
MCV: 77.6 fL — ABNORMAL LOW (ref 80.0–100.0)
Monocytes Absolute: 0.8 10*3/uL (ref 0.1–1.0)
Monocytes Relative: 8 %
Neutro Abs: 6.3 10*3/uL (ref 1.7–7.7)
Neutrophils Relative %: 62 %
Platelets: 269 10*3/uL (ref 150–400)
RBC: 4.82 MIL/uL (ref 3.87–5.11)
RDW: 13.5 % (ref 11.5–15.5)
WBC: 10.1 10*3/uL (ref 4.0–10.5)
nRBC: 0 % (ref 0.0–0.2)

## 2019-08-14 LAB — COMPREHENSIVE METABOLIC PANEL
ALT: 13 U/L (ref 0–44)
AST: 14 U/L — ABNORMAL LOW (ref 15–41)
Albumin: 3.8 g/dL (ref 3.5–5.0)
Alkaline Phosphatase: 55 U/L (ref 38–126)
Anion gap: 10 (ref 5–15)
BUN: 10 mg/dL (ref 6–20)
CO2: 21 mmol/L — ABNORMAL LOW (ref 22–32)
Calcium: 8.9 mg/dL (ref 8.9–10.3)
Chloride: 101 mmol/L (ref 98–111)
Creatinine, Ser: 0.44 mg/dL (ref 0.44–1.00)
GFR calc Af Amer: >60 mL/min (ref >60)
GFR calc non Af Amer: >60 mL/min (ref >60)
Glucose, Bld: 334 mg/dL — ABNORMAL HIGH (ref 70–99)
Potassium: 3.5 mmol/L (ref 3.5–5.1)
Sodium: 132 mmol/L — ABNORMAL LOW (ref 135–145)
Total Bilirubin: 0.5 mg/dL (ref 0.3–1.2)
Total Protein: 7.5 g/dL (ref 6.5–8.1)

## 2019-08-14 MED ORDER — SODIUM CHLORIDE 0.9 % IV BOLUS
1000.0000 mL | Freq: Once | INTRAVENOUS | Status: AC
  Administered 2019-08-14: 1000 mL via INTRAVENOUS

## 2019-08-14 MED ORDER — GABAPENTIN 300 MG PO CAPS: 300.0000 mg | ORAL_CAPSULE | Freq: Every day | ORAL | 0 refills | Status: DC

## 2019-08-14 MED ORDER — GABAPENTIN 300 MG PO CAPS
300.0000 mg | ORAL_CAPSULE | Freq: Once | ORAL | Status: AC
  Administered 2019-08-14: 22:00:00 300 mg via ORAL
  Filled 2019-08-14: qty 1

## 2019-08-14 NOTE — ED Provider Notes (Signed)
[facility] Emergency Department Provider Note   ____________________________________________   First MD Initiated Contact with Patient 08/14/19 2140     (approximate)  I have reviewed the triage vital signs and the nursing notes.   HISTORY  Chief Complaint Numbness    HPI Chelsea Vazquez is a 29 y.o. female with possible history of diabetes who presents to the ED complaining of numbness and tingling.  Patient reports she has been having painful sensation of pins-and-needles primarily affecting her left lower extremity, but also affecting her left upper extremity for about the past week.  She denies any associated weakness, but states it has been painful to put weight on her left foot.  She is also concerned that her blood glucose levels have been running high.  She was seen in the ED last week for this problem and had her Metformin dose increased, it was again increased at her PCPs office on Saturday to 1000 mg twice daily.  She states she has been taking this consistently.  She denies any fevers, chills, cough, chest pain, shortness of breath, dysuria, or hematuria.        Past Medical History:  Diagnosis Date  . Depression   . GDM (gestational diabetes mellitus)    fourth pregnancy  . History of chlamydia 2011  . History of IUFD    D/t Abruption HELLP  . Migraines   . Premature labor 11/29/2011   35 weeks  . Pyelonephritis 2011    Patient Active Problem List   Diagnosis Date Noted  . GDM (gestational diabetes mellitus)   . History of IUFD   . Depression   . Premature labor 11/29/2011  . History of chlamydia 07/09/2009  . Pyelonephritis 07/09/2009    Past Surgical History:  Procedure Laterality Date  . tubal ligation  10/10/2014   [facility]    Prior to Admission medications   Medication Sig Start Date End Date Taking? Authorizing Provider  gabapentin (NEURONTIN) 300 MG capsule Take 1 capsule (300 mg total) by mouth at bedtime.  08/14/19 09/13/19  Chesley Noon, MD  HYDROcodone-acetaminophen (NORCO/VICODIN) 5-325 MG tablet Take 1 tablet by mouth every 6 (six) hours as needed for moderate pain. 02/22/19   Tommi Rumps, PA-C  metFORMIN (GLUCOPHAGE) 850 MG tablet Take 1 tablet (850 mg total) by mouth 2 (two) times daily with a meal. 08/04/19 08/03/20  Willy Eddy, MD  sulfamethoxazole-trimethoprim (BACTRIM DS) 800-160 MG tablet Take 1 tablet by mouth 2 (two) times daily. 02/22/19   Tommi Rumps, PA-C  promethazine (PHENERGAN) 25 MG tablet Take 1 tablet (25 mg total) by mouth every 6 (six) hours as needed for nausea or vomiting. 06/02/16 02/22/19  Joni Reining, PA-C    Allergies Amoxicillin, Naproxen, and Tylenol [acetaminophen]  Family History  Problem Relation Age of Onset  . Hypertension Mother   . Hypertension Father   . Diabetes Paternal Grandmother        Type 1  . Sickle cell trait Other     Social History Social History   Tobacco Use  . Smoking status: Never Smoker  . Smokeless tobacco: Never Used  Substance Use Topics  . Alcohol use: No  . Drug use: No    Review of Systems  Constitutional: No fever/chills Eyes: No visual changes. ENT: No sore throat. Cardiovascular: Denies chest pain. Respiratory: Denies shortness of breath. Gastrointestinal: No abdominal pain.  No nausea, no vomiting.  No diarrhea.  No constipation. Genitourinary: Negative for dysuria. Musculoskeletal: Negative  for back pain. Skin: Negative for rash. Neurological: Negative for headaches or weakness.  Positive for numbness and tingling.  ____________________________________________   PHYSICAL EXAM:  VITAL SIGNS: ED Triage Vitals  Enc Vitals Group     BP 08/14/19 1735 103/67     Pulse Rate 08/14/19 1735 (!) 116     Resp 08/14/19 1735 18     Temp 08/14/19 1735 98.8 F (37.1 C)     Temp Source 08/14/19 1735 Oral     SpO2 08/14/19 1735 100 %     Weight 08/14/19 1750 110 lb (49.9 kg)     Height 08/14/19  1750 5\' 6"  (1.676 m)     Head Circumference --      Peak Flow --      Pain Score 08/14/19 1750 10     Pain Loc --      Pain Edu? --      Excl. in [place]? --     Constitutional: Alert and oriented. Eyes: Conjunctivae are normal. Head: Atraumatic. Nose: No congestion/rhinnorhea. Mouth/Throat: Mucous membranes are moist. Neck: Normal ROM Cardiovascular: Normal rate, regular rhythm. Grossly normal heart sounds.  2+ radial and DP pulses bilaterally. Respiratory: Normal respiratory effort.  No retractions. Lungs CTAB. Gastrointestinal: Soft and nontender. No distention. Genitourinary: deferred Musculoskeletal: No lower extremity tenderness nor edema. Neurologic:  Normal speech and language. No gross focal neurologic deficits are appreciated.  5 out of 5 strength in bilateral upper and lower extremities, no pronator drift. Skin:  Skin is warm, dry and intact. No rash noted. Psychiatric: Mood and affect are normal. Speech and behavior are normal.  ____________________________________________   LABS (all labs ordered are listed, but only abnormal results are displayed)  Labs Reviewed  CBC WITH DIFFERENTIAL/PLATELET - Abnormal; Notable for the following components:      Result Value   MCV 77.6 (*)    MCH 24.9 (*)    All other components within normal limits  COMPREHENSIVE METABOLIC PANEL - Abnormal; Notable for the following components:   Sodium 132 (*)    CO2 21 (*)    Glucose, Bld 334 (*)    AST 14 (*)    All other components within normal limits     PROCEDURES  Procedure(s) performed (including Critical Care):  Procedures   ____________________________________________   INITIAL IMPRESSION / ASSESSMENT AND PLAN / ED COURSE       29 year old female presents to the ED with numbness and tingling primarily affecting her left lower extremity but also affecting her left upper extremity.  She is neurovascularly intact to both extremities with no signs of infection.  No swelling  to suggest DVT.  She has no focal findings on her neurologic exam and stroke seems unlikely given her young age and lack of weakness.  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  Patient feeling better following IV fluids and dose of gabapentin, will prescribe gabapentin for home use.  I have counseled her to follow-up with her PCP regarding ongoing hyperglycemia, she will otherwise return to the ED for new or worsening symptoms.      ____________________________________________   FINAL CLINICAL IMPRESSION(S) / ED DIAGNOSES  Final diagnoses:  Hyperglycemia  Paresthesia     ED Discharge Orders         Ordered    gabapentin (NEURONTIN) 300 MG capsule  Daily at bedtime     08/14/19 2334  Note:  This document was prepared using Dragon voice recognition software and may include unintentional dictation errors.   Chesley Noon, MD 08/14/19 2352

## 2019-08-14 NOTE — ED Triage Notes (Signed)
Patient c/o numbness/tingling from left knee down to foot. Patient states "It's to the point I cannot even walk it hurts so bad" HX diabetes

## 2019-08-14 NOTE — ED Notes (Signed)
Pt complains of left sided leg pain and numbness. Pulse on L foot 3+ with cap refill <3 seconds.
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
## 2019-08-23 ENCOUNTER — Emergency Department
Admission: EM | Admit: 2019-08-23 | Discharge: 2019-08-23 | Disposition: A | Discharge status: Home / Self Care | Payer: Medicaid Other | Attending: Emergency Medicine | Admitting: Emergency Medicine

## 2019-08-23 ENCOUNTER — Other Ambulatory Visit: Payer: Self-pay

## 2019-08-23 ENCOUNTER — Encounter: Payer: Self-pay | Admitting: Emergency Medicine

## 2019-08-23 DIAGNOSIS — Z7984 Long term (current) use of oral hypoglycemic drugs: Secondary | ICD-10-CM | POA: Diagnosis not present

## 2019-08-23 DIAGNOSIS — Z20822 Contact with and (suspected) exposure to covid-19: Secondary | ICD-10-CM | POA: Insufficient documentation

## 2019-08-23 DIAGNOSIS — N390 Urinary tract infection, site not specified: Secondary | ICD-10-CM | POA: Insufficient documentation

## 2019-08-23 DIAGNOSIS — E1165 Type 2 diabetes mellitus with hyperglycemia: Secondary | ICD-10-CM | POA: Insufficient documentation

## 2019-08-23 DIAGNOSIS — R739 Hyperglycemia, unspecified: Secondary | ICD-10-CM

## 2019-08-23 DIAGNOSIS — R079 Chest pain, unspecified: Secondary | ICD-10-CM | POA: Insufficient documentation

## 2019-08-23 LAB — URINALYSIS, COMPLETE (UACMP) WITH MICROSCOPIC
Bilirubin Urine: NEGATIVE
Glucose, UA: >=500 mg/dL — AB
Ketones, ur: 80 mg/dL — AB
Nitrite: NEGATIVE
Protein, ur: 30 mg/dL — AB
Specific Gravity, Urine: 1.025 (ref 1.005–1.030)
WBC, UA: >50 WBC/hpf — ABNORMAL HIGH (ref 0–5)
pH: 6 (ref 5.0–8.0)

## 2019-08-23 LAB — CBC
HCT: 43.2 % (ref 36.0–46.0)
Hemoglobin: 13.7 g/dL (ref 12.0–15.0)
MCH: 24.9 pg — ABNORMAL LOW (ref 26.0–34.0)
MCHC: 31.7 g/dL (ref 30.0–36.0)
MCV: 78.4 fL — ABNORMAL LOW (ref 80.0–100.0)
Platelets: 303 10*3/uL (ref 150–400)
RBC: 5.51 MIL/uL — ABNORMAL HIGH (ref 3.87–5.11)
RDW: 14.5 % (ref 11.5–15.5)
WBC: 7.4 10*3/uL (ref 4.0–10.5)
nRBC: 0 % (ref 0.0–0.2)

## 2019-08-23 LAB — BASIC METABOLIC PANEL
Anion gap: 14 (ref 5–15)
BUN: 13 mg/dL (ref 6–20)
CO2: 23 mmol/L (ref 22–32)
Calcium: 9.2 mg/dL (ref 8.9–10.3)
Chloride: 99 mmol/L (ref 98–111)
Creatinine, Ser: 0.53 mg/dL (ref 0.44–1.00)
GFR calc Af Amer: >60 mL/min (ref >60)
GFR calc non Af Amer: >60 mL/min (ref >60)
Glucose, Bld: 280 mg/dL — ABNORMAL HIGH (ref 70–99)
Potassium: 3.6 mmol/L (ref 3.5–5.1)
Sodium: 136 mmol/L (ref 135–145)

## 2019-08-23 LAB — HEPATIC FUNCTION PANEL
ALT: 14 U/L (ref 0–44)
AST: 18 U/L (ref 15–41)
Albumin: 4.1 g/dL (ref 3.5–5.0)
Alkaline Phosphatase: 68 U/L (ref 38–126)
Bilirubin, Direct: <0.1 mg/dL (ref 0.0–0.2)
Total Bilirubin: 1 mg/dL (ref 0.3–1.2)
Total Protein: 8.5 g/dL — ABNORMAL HIGH (ref 6.5–8.1)

## 2019-08-23 LAB — GLUCOSE, CAPILLARY
Glucose-Capillary: 242 mg/dL — ABNORMAL HIGH (ref 70–99)
Glucose-Capillary: 242 mg/dL — ABNORMAL HIGH (ref 70–99)
Glucose-Capillary: 207 mg/dL — ABNORMAL HIGH (ref 70–99)
Glucose-Capillary: 170 mg/dL — ABNORMAL HIGH (ref 70–99)

## 2019-08-23 LAB — POCT PREGNANCY, URINE: Preg Test, Ur: NEGATIVE

## 2019-08-23 LAB — SARS CORONAVIRUS 2 (TAT 6-24 HRS): SARS Coronavirus 2: NEGATIVE

## 2019-08-23 LAB — MAGNESIUM: Magnesium: 2 mg/dL (ref 1.7–2.4)

## 2019-08-23 MED ORDER — CEPHALEXIN 500 MG PO CAPS: 500.0000 mg | ORAL_CAPSULE | Freq: Two times a day (BID) | ORAL | 0 refills | Status: AC

## 2019-08-23 MED ORDER — SODIUM CHLORIDE 0.9 % IV SOLN
1.0000 g | Freq: Once | INTRAVENOUS | Status: AC
  Administered 2019-08-23: 18:00:00 1 g via INTRAVENOUS
  Filled 2019-08-23: qty 10

## 2019-08-23 MED ORDER — SODIUM CHLORIDE 0.9 % IV BOLUS
1000.0000 mL | Freq: Once | INTRAVENOUS | Status: AC
  Administered 2019-08-23: 12:00:00 1000 mL via INTRAVENOUS

## 2019-08-23 MED ORDER — SODIUM CHLORIDE 0.9 % IV BOLUS
1000.0000 mL | Freq: Once | INTRAVENOUS | Status: AC
  Administered 2019-08-23: 11:00:00 1000 mL via INTRAVENOUS

## 2019-08-23 MED ORDER — LACTATED RINGERS IV BOLUS
1000.0000 mL | Freq: Once | INTRAVENOUS | Status: AC
  Administered 2019-08-23: 16:00:00 1000 mL via INTRAVENOUS

## 2019-08-23 NOTE — ED Notes (Addendum)
Pt states that she has DM and takes metformin. States she sees Phineas Real (last appt was Friday), states she has had increase in metformin dosages recently. States CBG has been running between 200 and 400. Pt denies taking insulin. Pt A&O. No distress noted. Denies hx of DKA.

## 2019-08-23 NOTE — ED Notes (Addendum)
Pt denies being able to provide urine sample at this time.  Ambulated pt, reports still feeling dizziness. Dr. Fuller Plan aware.  Warm blanket given

## 2019-08-23 NOTE — Discharge Instructions (Addendum)
Take the antibiotic as prescribed and finish the full 7-day course.  Your sugars were lowered with fluid however it is important that you follow-up with your primary care doctor.  Given you've had multiple episodes of high sugars without evidence of DKA it is important that you talk to them about changing your medications.  Since you have been compliant with the Metformin they will most likely need to add a 2nd agent or should restart you on insulin.  You should discuss with them for further recommendations.  Please give this information to them so they can make sure that they address this issue.  Your Covid test is pending.  Stay quarantined until tomorrow while this is coming back

## 2019-08-23 NOTE — ED Provider Notes (Signed)
[facility] Emergency Department Provider Note  ____________________________________________   First MD Initiated Contact with Patient 08/23/19 1038     (approximate)  I have reviewed the triage vital signs and the nursing notes.   HISTORY  Chief Complaint Hyperglycemia    HPI Chelsea Vazquez is a 29 y.o. female with a history of diabetes who comes in with high sugars.  Patient is supposed be on Metformin 1000 twice daily.  Patient states that she has been here multiple times in the past week due to her high sugars.  Her sugars range between the 200s to 400s.  States she is been compliant with her Metformin.  Nothing makes the sugars better, nothing seems to make them worse.  Patient states that she just felt lightheaded and dizzy.  She denies any shortness of breath, abdominal pain.          Past Medical History:  Diagnosis Date  . Depression   . GDM (gestational diabetes mellitus)    fourth pregnancy  . History of chlamydia 2011  . History of IUFD    D/t Abruption HELLP  . Migraines   . Premature labor 11/29/2011   35 weeks  . Pyelonephritis 2011    Patient Active Problem List   Diagnosis Date Noted  . GDM (gestational diabetes mellitus)   . History of IUFD   . Depression   . Premature labor 11/29/2011  . History of chlamydia 07/09/2009  . Pyelonephritis 07/09/2009    Past Surgical History:  Procedure Laterality Date  . tubal ligation  10/10/2014   [facility]    Prior to Admission medications   Medication Sig Start Date End Date Taking? Authorizing Provider  gabapentin (NEURONTIN) 300 MG capsule Take 1 capsule (300 mg total) by mouth at bedtime. 08/14/19 09/13/19  Chesley Noon, MD  HYDROcodone-acetaminophen (NORCO/VICODIN) 5-325 MG tablet Take 1 tablet by mouth every 6 (six) hours as needed for moderate pain. 02/22/19   Tommi Rumps, PA-C  metFORMIN (GLUCOPHAGE) 850 MG tablet Take 1 tablet (850 mg total) by mouth 2 (two) times  daily with a meal. 08/04/19 08/03/20  Willy Eddy, MD  sulfamethoxazole-trimethoprim (BACTRIM DS) 800-160 MG tablet Take 1 tablet by mouth 2 (two) times daily. 02/22/19   Tommi Rumps, PA-C  promethazine (PHENERGAN) 25 MG tablet Take 1 tablet (25 mg total) by mouth every 6 (six) hours as needed for nausea or vomiting. 06/02/16 02/22/19  Joni Reining, PA-C    Allergies Amoxicillin, Naproxen, and Tylenol [acetaminophen]  Family History  Problem Relation Age of Onset  . Hypertension Mother   . Hypertension Father   . Diabetes Paternal Grandmother        Type 1  . Sickle cell trait Other     Social History Social History   Tobacco Use  . Smoking status: Never Smoker  . Smokeless tobacco: Never Used  Substance Use Topics  . Alcohol use: No  . Drug use: No      Review of Systems Constitutional: No fever/chills lightheadedness, elevated sugars Eyes: No visual changes. ENT: No sore throat. Cardiovascular: Denies chest pain. Respiratory: Denies shortness of breath. Gastrointestinal: No abdominal pain.  No nausea, no vomiting.  No diarrhea.  No constipation. Genitourinary: Negative for dysuria. Musculoskeletal: Negative for back pain. Skin: Negative for rash. Neurological: Negative for headaches, focal weakness or numbness. All other ROS negative ____________________________________________   PHYSICAL EXAM:  VITAL SIGNS: ED Triage Vitals [08/23/19 1033]  Enc Vitals Group  BP 108/71     Pulse Rate (!) 120     Resp 18     Temp 98.5 F (36.9 C)     Temp src      SpO2 99 %     Weight 110 lb 0.2 oz (49.9 kg)     Height 5\' 6"  (1.676 m)     Head Circumference      Peak Flow      Pain Score 10     Pain Loc      Pain Edu?      Excl. in [place]?     Constitutional: Alert and oriented. Well appearing and in no acute distress. Eyes: Conjunctivae are normal. EOMI. Head: Atraumatic. Nose: No congestion/rhinnorhea. Mouth/Throat: Mucous membranes are dry Neck: No  stridor. Trachea Midline. FROM Cardiovascular: Tachycardic, regular rhythm. Grossly normal heart sounds.  Good peripheral circulation. Respiratory: Normal respiratory effort.  No retractions. Lungs CTAB. Gastrointestinal: Soft and nontender. No distention. No abdominal bruits.  Musculoskeletal: No lower extremity tenderness nor edema.  No joint effusions. Neurologic:  Normal speech and language. No gross focal neurologic deficits are appreciated.  Skin:  Skin is warm, dry and intact. No rash noted. Psychiatric: Mood and affect are normal. Speech and behavior are normal. GU: Deferred   ____________________________________________   LABS (all labs ordered are listed, but only abnormal results are displayed)  Labs Reviewed  GLUCOSE, CAPILLARY - Abnormal; Notable for the following components:      Result Value   Glucose-Capillary 242 (*)    All other components within normal limits  BASIC METABOLIC PANEL - Abnormal; Notable for the following components:   Glucose, Bld 280 (*)    All other components within normal limits  CBC - Abnormal; Notable for the following components:   RBC 5.51 (*)    MCV 78.4 (*)    MCH 24.9 (*)    All other components within normal limits  HEPATIC FUNCTION PANEL - Abnormal; Notable for the following components:   Total Protein 8.5 (*)    All other components within normal limits  GLUCOSE, CAPILLARY - Abnormal; Notable for the following components:   Glucose-Capillary 242 (*)    All other components within normal limits  GLUCOSE, CAPILLARY - Abnormal; Notable for the following components:   Glucose-Capillary 207 (*)    All other components within normal limits  SARS CORONAVIRUS 2 (TAT 6-24 HRS)  MAGNESIUM  URINALYSIS, COMPLETE (UACMP) WITH MICROSCOPIC  CBG MONITORING, ED  CBG MONITORING, ED  POC URINE PREG, ED   ____________________________________________   ED ECG REPORT I, Vanessa Cofield, the attending physician, personally viewed and interpreted  this ECG.  EKG is sinus tachycardia rate of 116, no ST elevations, no T wave inversions, normal intervals ____________________________________________ PROCEDURES  Procedure(s) performed (including Critical Care):  Procedures   ____________________________________________   INITIAL IMPRESSION / ASSESSMENT AND PLAN / ED COURSE  Chelsea Vazquez was evaluated in Emergency Department on 08/23/2019 for the symptoms described in the history of present illness. She was evaluated in the context of the global COVID-19 pandemic, which necessitated consideration that the patient might be at risk for infection with the SARS-CoV-2 virus that causes COVID-19. Institutional protocols and algorithms that pertain to the evaluation of patients at risk for COVID-19 are in a state of rapid change based on information released by regulatory bodies including the CDC and federal and state organizations. These policies and algorithms were followed during the patient's care in the ED.    Patient  is a 29 year old who comes in with elevated sugars in the setting of some tachycardia.  Patient is most likely dehydrated.  Will give some fluids.  Patient sugars currently in the 200s.  No history of DKA.  Will get labs to evaluate for DKA.  Will get labs evaluate for AKI, electrolyte abnormalities.  Patient denies any infectious symptoms.  Will get urine pregnant and UA to evaluate for UTI.  I had a lengthy discussion with patient that is important that her primary doctor is the one who makes her medication changes.   Patient labs are reassuring.  Evidence of DKA.  Glucose is about in the 200s.  Patient tachycardia is resolving.  Patient is ambulating with saturations are normal.  Patient states that she still just does not feel well but is not able to give any further description of this.  We'll send a Covid test.  She is no abdominal tenderness to suggest abdominal infection.  Denies shortness of breath.  Patient is  tolerating p.o.  Patient is still waiting for UA to evaluate for pregnancy/UTI.  Discussed with patient that she needs to follow-up with her primary care doctor and they may need to add a 2nd agent on given her continued high sugars in the setting of being compliant with her Metformin.  Patient expressed understanding and stated that she would follow-up with them.  Patient handed off to oncoming team pending urine and most likely discharge home.    ____________________________________________   FINAL CLINICAL IMPRESSION(S) / ED DIAGNOSES   Final diagnoses:  Hyperglycemia      MEDICATIONS GIVEN DURING THIS VISIT:  Medications  lactated ringers bolus 1,000 mL (has no administration in time range)  sodium chloride 0.9 % bolus 1,000 mL (0 mLs Intravenous Stopped 08/23/19 1424)  sodium chloride 0.9 % bolus 1,000 mL (0 mLs Intravenous Stopped 08/23/19 1219)     ED Discharge Orders    None       Note:  This document was prepared using Dragon voice recognition software and may include unintentional dictation errors.   Concha Se, MD 08/23/19 1536

## 2019-08-23 NOTE — ED Notes (Signed)
Pt denies being able to provide urine sample at this time

## 2019-08-23 NOTE — ED Notes (Signed)
CBG 242 

## 2019-08-23 NOTE — ED Notes (Signed)
Signature pad not working, pt verbalizes understanding of d/c instructions, no further questions or concerns at this time 

## 2019-08-23 NOTE — ED Provider Notes (Signed)
-----------------------------------------   4:41 PM on 08/23/2019 -----------------------------------------  I took over care on this patient from Dr. Fuller Plan.  Patient reports some chest pain.  Her vital signs are normal.  Repeat EKG was performed and shows no acute abnormalities.  ED ECG REPORT I, Dionne Bucy, the attending physician, personally viewed and interpreted this ECG.  Date: 08/23/2019 EKG Time: 1635 Rate: 102 Rhythm: normal sinus rhythm QRS Axis: normal Intervals: normal ST/T Wave abnormalities: normal Narrative Interpretation: no evidence of acute ischemia; no significant change when compared to EKG of 1112 today  ----------------------------------------- 5:24 PM on 08/23/2019 -----------------------------------------  Urinalysis reveals findings consistent with a UTI.  There are some ketones on the UA, but overall there is no evidence of DKA.  On reassessment, the patient appears relatively well and her vital signs have improved.  She feels comfortable going home, and she is tolerating p.o.  Lab work-up is reassuring.  I counseled her on the results of the work-up and the plan of care.  She will receive a dose of ceftriaxone in the ED and I will discharge her with antibiotics.  Return precautions given, and she expresses understanding.   Dionne Bucy, MD 08/23/19 1725

## 2019-08-23 NOTE — ED Triage Notes (Signed)
Pt to ER reports hyperglycemia at home since last night.  States highest at home 400, CGB here today 242.  Pt reports she is loosing feeling in her feet and feels like she is going to pass out.

## 2019-08-23 NOTE — ED Notes (Signed)
Pt reporting centralized chest pressure new onset. Denies radiation to anywhere else. Siadecki notified, EKG ordered and performed.  Pt ambulated to toilet, steady gait. Urine sample collected Pt in NAD at this time

## 2019-10-07 ENCOUNTER — Emergency Department
Admission: EM | Admit: 2019-10-07 | Discharge: 2019-10-08 | Disposition: A | Discharge status: Home / Self Care | Payer: Medicaid Other | Attending: Emergency Medicine | Admitting: Emergency Medicine

## 2019-10-07 ENCOUNTER — Other Ambulatory Visit: Payer: Self-pay

## 2019-10-07 ENCOUNTER — Encounter: Payer: Self-pay | Admitting: Radiology

## 2019-10-07 ENCOUNTER — Emergency Department: Payer: Medicaid Other

## 2019-10-07 DIAGNOSIS — Y9389 Activity, other specified: Secondary | ICD-10-CM | POA: Insufficient documentation

## 2019-10-07 DIAGNOSIS — E1165 Type 2 diabetes mellitus with hyperglycemia: Secondary | ICD-10-CM | POA: Diagnosis not present

## 2019-10-07 DIAGNOSIS — R55 Syncope and collapse: Secondary | ICD-10-CM | POA: Diagnosis not present

## 2019-10-07 DIAGNOSIS — S1083XA Contusion of other specified part of neck, initial encounter: Secondary | ICD-10-CM | POA: Insufficient documentation

## 2019-10-07 DIAGNOSIS — Y929 Unspecified place or not applicable: Secondary | ICD-10-CM | POA: Diagnosis not present

## 2019-10-07 DIAGNOSIS — S199XXA Unspecified injury of neck, initial encounter: Secondary | ICD-10-CM | POA: Diagnosis present

## 2019-10-07 DIAGNOSIS — Y999 Unspecified external cause status: Secondary | ICD-10-CM | POA: Insufficient documentation

## 2019-10-07 LAB — CBC WITH DIFFERENTIAL/PLATELET
Abs Immature Granulocytes: 0.04 10*3/uL (ref 0.00–0.07)
Basophils Absolute: 0.1 10*3/uL (ref 0.0–0.1)
Basophils Relative: 0 %
Eosinophils Absolute: 0.1 10*3/uL (ref 0.0–0.5)
Eosinophils Relative: 1 %
HCT: 43.5 % (ref 36.0–46.0)
Hemoglobin: 13.4 g/dL (ref 12.0–15.0)
Immature Granulocytes: 0 %
Lymphocytes Relative: 24 %
Lymphs Abs: 2.8 10*3/uL (ref 0.7–4.0)
MCH: 23.8 pg — ABNORMAL LOW (ref 26.0–34.0)
MCHC: 30.8 g/dL (ref 30.0–36.0)
MCV: 77.4 fL — ABNORMAL LOW (ref 80.0–100.0)
Monocytes Absolute: 0.6 10*3/uL (ref 0.1–1.0)
Monocytes Relative: 5 %
Neutro Abs: 8.1 10*3/uL — ABNORMAL HIGH (ref 1.7–7.7)
Neutrophils Relative %: 70 %
Platelets: 278 10*3/uL (ref 150–400)
RBC: 5.62 MIL/uL — ABNORMAL HIGH (ref 3.87–5.11)
RDW: 14.2 % (ref 11.5–15.5)
WBC: 11.7 10*3/uL — ABNORMAL HIGH (ref 4.0–10.5)
nRBC: 0 % (ref 0.0–0.2)

## 2019-10-07 LAB — COMPREHENSIVE METABOLIC PANEL
ALT: 16 U/L (ref 0–44)
AST: 26 U/L (ref 15–41)
Albumin: 4.3 g/dL (ref 3.5–5.0)
Alkaline Phosphatase: 67 U/L (ref 38–126)
Anion gap: 10 (ref 5–15)
BUN: 10 mg/dL (ref 6–20)
CO2: 23 mmol/L (ref 22–32)
Calcium: 9.4 mg/dL (ref 8.9–10.3)
Chloride: 100 mmol/L (ref 98–111)
Creatinine, Ser: 0.57 mg/dL (ref 0.44–1.00)
GFR calc Af Amer: >60 mL/min (ref >60)
GFR calc non Af Amer: >60 mL/min (ref >60)
Glucose, Bld: 308 mg/dL — ABNORMAL HIGH (ref 70–99)
Potassium: 4.4 mmol/L (ref 3.5–5.1)
Sodium: 133 mmol/L — ABNORMAL LOW (ref 135–145)
Total Bilirubin: 1 mg/dL (ref 0.3–1.2)
Total Protein: 8.9 g/dL — ABNORMAL HIGH (ref 6.5–8.1)

## 2019-10-07 MED ORDER — OXYCODONE HCL 5 MG PO TABS: 5.0000 mg | ORAL_TABLET | Freq: Three times a day (TID) | ORAL | 0 refills | Status: AC | PRN

## 2019-10-07 MED ORDER — IOHEXOL 350 MG/ML SOLN
75.0000 mL | Freq: Once | INTRAVENOUS | Status: AC | PRN
  Administered 2019-10-07: 22:00:00 75 mL via INTRAVENOUS

## 2019-10-07 MED ORDER — MORPHINE SULFATE (PF) 4 MG/ML IV SOLN
4.0000 mg | Freq: Once | INTRAVENOUS | Status: AC
  Administered 2019-10-07: 4 mg via INTRAVENOUS
  Filled 2019-10-07: qty 1

## 2019-10-07 NOTE — Discharge Instructions (Signed)
IT IS VERY IMPORTANT FOR YOU TO MEASURE YOUR NECK WITH THE TAPE MEASURE PROVIDED EVERY 12 HOURS OR MORE FREQUENTLY.  IF THE MEASUREMENT OF YOUR NECK INCREASES, RETURN TO THE EMERGENCY ROOM IMMEDIATELY!  ALSO RETURN FOR ANY INCREASED DIFFICULTY IN BREATHING, SWALLOWING OR WORSENING OF SYMPTOMS.  Interpersonal Violence   Interpersonal Violence aka Domestic Violence is defined as violence between people who have had a personal relationship. For example, someone you have ever dated, been married to or in a domestic partnership with. Someone with whom you have a child in common, or a current  household member.  Does one or more of the following  . attempts to cause bodily injury, or intentionally causes bodily injury; . places you or a member of your family or household in fear of imminent serious bodily injury; . continued harassment that rises to such a level as to inflict substantial emotional distress; or . commits any rape or sexual offense  You are not alone. Unfortunately domestic violence is very common. Domestic violence does not go away on its own and tends to get worse over time and more frequent. There are people who can help. There are resources included in these instructions. Evidence can be collected in case you want to notify law enforcement now or in the future. A forensic nurse can take photographs and create a medical/legal document of the incident. If you choose to report to law enforcement, they will request a copy of the chart which we can provide with your permission. We can call in social work or an advocate to help with safety planning and emergency placement in a shelter if you have no other safe options.  THE POLICE CAN HELP YOU:  . Get to a safe place away from the violence.  . Get information on how the court can help protect you against the violence.  . Get necessary belongings from your home for you and your children.  . Get copies of police reports about the  violence.  . File a complaint in criminal court.  . Find where local criminal and family courts are located.  The [facility] Can Help You . Safety Planning . Assistance with [place] . Obtaining a Protective Order (50B) . Careers adviser . Support Group . Legal Assistance . Assistance with domestic violence related criminal charges . Child Protective Services . Supervised Visitation . Financial Assistance Enrollment . Job Readiness . Budget Counseling  . Coaching and Mentoring  Call your local domestic violence program for additional information and support.   [facility]   336-641-SAFE Crisis Line [place]   [phone] Crisis Line [phone] Legal Aid of [facility] [phone]  National Domestic Violence Abuse Hotline  ([phone]    Soft Tissue Injury of the Neck (Strangulation)  A soft tissue injury of the neck is serious and needs medical care right away.  Some injuries do not break the skin (blunt injury).  Some injuries do break the skin (penetrating injury) and create an open wound.  You may feel fine at first, but the puffiness (swelling) in your throat can slowly make it harder to breathe.  This could cause serious or life-threatening injury.  There could be damage to major blood vessels and nerves in the neck.    Be sure to tell your health care provider how the injury occurred and if someone else caused the injury.  Also, tell the provider if  any object or hands were used to cause the injury (such as rope, clothesline, telephone cord, etc).    Home Care Get help right away if:  Your voice gets weaker or hoarse  Your puffiness or bruising does not get better  You have new puffiness or bruising in the face or neck  Your pain gets worse   You have trouble swallowing  You cough up blood  You have trouble breathing  You start to drool  You  start throwing up (vomiting)  You have a fever of 102 degrees  Adapted from [facility] Patient Information

## 2019-10-07 NOTE — ED Notes (Signed)
Leisure centre manager Gerri notified.
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
## 2019-10-07 NOTE — ED Notes (Signed)
Pt to the sane room. Pt moved off the floor.
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
## 2019-10-08 ENCOUNTER — Emergency Department
Admission: EM | Admit: 2019-10-08 | Discharge: 2019-10-08 | Disposition: A | Discharge status: Home / Self Care | Payer: Medicaid Other | Source: Home / Self Care | Attending: Emergency Medicine | Admitting: Emergency Medicine

## 2019-10-08 ENCOUNTER — Other Ambulatory Visit: Payer: Self-pay

## 2019-10-08 DIAGNOSIS — Z79899 Other long term (current) drug therapy: Secondary | ICD-10-CM | POA: Insufficient documentation

## 2019-10-08 DIAGNOSIS — Z7984 Long term (current) use of oral hypoglycemic drugs: Secondary | ICD-10-CM | POA: Insufficient documentation

## 2019-10-08 DIAGNOSIS — R1310 Dysphagia, unspecified: Secondary | ICD-10-CM | POA: Insufficient documentation

## 2019-10-08 DIAGNOSIS — R070 Pain in throat: Secondary | ICD-10-CM | POA: Insufficient documentation

## 2019-10-08 MED ORDER — LIDOCAINE VISCOUS HCL 2 % MT SOLN
15.0000 mL | Freq: Once | OROMUCOSAL | Status: AC
  Administered 2019-10-08: 03:00:00 15 mL via OROMUCOSAL
  Filled 2019-10-08: qty 15

## 2019-10-08 MED ORDER — IBUPROFEN 100 MG/5ML PO SUSP
400.0000 mg | Freq: Once | ORAL | Status: AC
  Administered 2019-10-08: 03:00:00 400 mg via ORAL
  Filled 2019-10-08: qty 20
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
## 2019-10-12 ENCOUNTER — Encounter: Payer: Self-pay | Admitting: Emergency Medicine

## 2019-10-12 ENCOUNTER — Other Ambulatory Visit: Payer: Self-pay

## 2019-10-12 ENCOUNTER — Emergency Department: Payer: Medicaid Other

## 2019-10-12 DIAGNOSIS — Z79899 Other long term (current) drug therapy: Secondary | ICD-10-CM | POA: Diagnosis not present

## 2019-10-12 DIAGNOSIS — N200 Calculus of kidney: Secondary | ICD-10-CM | POA: Diagnosis not present

## 2019-10-12 DIAGNOSIS — N83202 Unspecified ovarian cyst, left side: Secondary | ICD-10-CM | POA: Diagnosis not present

## 2019-10-12 DIAGNOSIS — Z7984 Long term (current) use of oral hypoglycemic drugs: Secondary | ICD-10-CM | POA: Diagnosis not present

## 2019-10-12 DIAGNOSIS — E1165 Type 2 diabetes mellitus with hyperglycemia: Secondary | ICD-10-CM | POA: Diagnosis not present

## 2019-10-12 DIAGNOSIS — K5900 Constipation, unspecified: Secondary | ICD-10-CM | POA: Insufficient documentation

## 2019-10-12 DIAGNOSIS — R2243 Localized swelling, mass and lump, lower limb, bilateral: Secondary | ICD-10-CM | POA: Diagnosis present

## 2019-10-12 LAB — COMPREHENSIVE METABOLIC PANEL
ALT: 13 U/L (ref 0–44)
AST: 18 U/L (ref 15–41)
Albumin: 3.7 g/dL (ref 3.5–5.0)
Alkaline Phosphatase: 54 U/L (ref 38–126)
Anion gap: 11 (ref 5–15)
BUN: 6 mg/dL (ref 6–20)
CO2: 19 mmol/L — ABNORMAL LOW (ref 22–32)
Calcium: 8.8 mg/dL — ABNORMAL LOW (ref 8.9–10.3)
Chloride: 99 mmol/L (ref 98–111)
Creatinine, Ser: 0.55 mg/dL (ref 0.44–1.00)
GFR calc Af Amer: >60 mL/min (ref >60)
GFR calc non Af Amer: >60 mL/min (ref >60)
Glucose, Bld: 480 mg/dL — ABNORMAL HIGH (ref 70–99)
Potassium: 3.6 mmol/L (ref 3.5–5.1)
Sodium: 129 mmol/L — ABNORMAL LOW (ref 135–145)
Total Bilirubin: 0.5 mg/dL (ref 0.3–1.2)
Total Protein: 7.6 g/dL (ref 6.5–8.1)

## 2019-10-12 LAB — CBC WITH DIFFERENTIAL/PLATELET
Abs Immature Granulocytes: 0.02 10*3/uL (ref 0.00–0.07)
Basophils Absolute: 0.1 10*3/uL (ref 0.0–0.1)
Basophils Relative: 1 %
Eosinophils Absolute: 0.1 10*3/uL (ref 0.0–0.5)
Eosinophils Relative: 2 %
HCT: 36.3 % (ref 36.0–46.0)
Hemoglobin: 11.7 g/dL — ABNORMAL LOW (ref 12.0–15.0)
Immature Granulocytes: 0 %
Lymphocytes Relative: 39 %
Lymphs Abs: 3.1 10*3/uL (ref 0.7–4.0)
MCH: 24.3 pg — ABNORMAL LOW (ref 26.0–34.0)
MCHC: 32.2 g/dL (ref 30.0–36.0)
MCV: 75.3 fL — ABNORMAL LOW (ref 80.0–100.0)
Monocytes Absolute: 0.7 10*3/uL (ref 0.1–1.0)
Monocytes Relative: 9 %
Neutro Abs: 3.9 10*3/uL (ref 1.7–7.7)
Neutrophils Relative %: 49 %
Platelets: 257 10*3/uL (ref 150–400)
RBC: 4.82 MIL/uL (ref 3.87–5.11)
RDW: 14.6 % (ref 11.5–15.5)
WBC: 7.9 10*3/uL (ref 4.0–10.5)
nRBC: 0 % (ref 0.0–0.2)

## 2019-10-12 LAB — BRAIN NATRIURETIC PEPTIDE: B Natriuretic Peptide: 21 pg/mL (ref 0.0–100.0)

## 2019-10-12 LAB — GLUCOSE, CAPILLARY: Glucose-Capillary: 463 mg/dL — ABNORMAL HIGH (ref 70–99)

## 2019-10-12 NOTE — ED Triage Notes (Signed)
Pt to ED via POV for abnormal lab by her PCP/. Pt st her PCP told her to come to the ED to be evaluated for her anuria/ lower leg swelling and elevated blood sugars. Pt's Blood sugar in triage was 463. Pt A&Ox4 and takes oral medication for her diabetes. Pt st she has had urinary urgency but no output since last night. Pt reports polyuria daily

## 2019-10-12 NOTE — ED Notes (Signed)
Bladder scan amount >642  MD notified

## 2019-10-13 ENCOUNTER — Emergency Department: Payer: Medicaid Other

## 2019-10-13 ENCOUNTER — Emergency Department
Admission: EM | Admit: 2019-10-13 | Discharge: 2019-10-13 | Disposition: A | Discharge status: Home / Self Care | Payer: Medicaid Other | Attending: Emergency Medicine | Admitting: Emergency Medicine

## 2019-10-13 DIAGNOSIS — N200 Calculus of kidney: Secondary | ICD-10-CM

## 2019-10-13 DIAGNOSIS — N83202 Unspecified ovarian cyst, left side: Secondary | ICD-10-CM

## 2019-10-13 DIAGNOSIS — K5900 Constipation, unspecified: Secondary | ICD-10-CM

## 2019-10-13 DIAGNOSIS — R739 Hyperglycemia, unspecified: Secondary | ICD-10-CM

## 2019-10-13 LAB — URINALYSIS, COMPLETE (UACMP) WITH MICROSCOPIC
Bacteria, UA: NONE SEEN
Bilirubin Urine: NEGATIVE
Glucose, UA: >=500 mg/dL — AB
Hgb urine dipstick: NEGATIVE
Ketones, ur: NEGATIVE mg/dL
Leukocytes,Ua: NEGATIVE
Nitrite: NEGATIVE
Protein, ur: NEGATIVE mg/dL
Specific Gravity, Urine: 1.037 — ABNORMAL HIGH (ref 1.005–1.030)
pH: 7 (ref 5.0–8.0)

## 2019-10-13 LAB — POCT PREGNANCY, URINE: Preg Test, Ur: NEGATIVE

## 2019-10-13 LAB — GLUCOSE, CAPILLARY: Glucose-Capillary: 100 mg/dL — ABNORMAL HIGH (ref 70–99)

## 2019-10-13 MED ORDER — LACTULOSE 10 GM/15ML PO SOLN: 20.0000 g | Freq: Every day | ORAL | 0 refills | Status: DC | PRN

## 2019-10-13 MED ORDER — SODIUM CHLORIDE 0.9 % IV BOLUS
1000.0000 mL | Freq: Once | INTRAVENOUS | Status: AC
  Administered 2019-10-13: 1000 mL via INTRAVENOUS

## 2019-10-13 MED ORDER — INSULIN ASPART 100 UNIT/ML ~~LOC~~ SOLN
10.0000 [IU] | Freq: Once | SUBCUTANEOUS | Status: AC
  Administered 2019-10-13: 10 [IU] via SUBCUTANEOUS
  Filled 2019-10-13: qty 1

## 2019-10-13 NOTE — ED Notes (Signed)
Repeat bladder scan reveals >567cc PT denies pain or urge to urinate PT encouraged to sit on toilet PT currently urinating.

## 2019-10-13 NOTE — ED Notes (Signed)
Pt given graham crackers and orange juice per MD Dolores Frame verbal order.

## 2019-10-13 NOTE — ED Notes (Signed)
Pt given warm blanket and ice water.  

## 2019-10-13 NOTE — ED Notes (Signed)
PT taken to CT.

## 2019-10-13 NOTE — ED Provider Notes (Signed)
[facility] Emergency Department Provider Note   ____________________________________________   First MD Initiated Contact with Patient 10/13/19 [phone]     (approximate)  I have reviewed the triage vital signs and the nursing notes.   HISTORY  Chief Complaint Blood Sugar Problem and Abnormal Lab    HPI Chelsea Vazquez is a 29 y.o. female who presents to the ED from home with a chief complaint of bilateral feet swelling, elevated blood sugars and anuria.  Patient was directed to the ED by her PCP.  Takes Metformin 1 g twice daily.  States she saw her PCP 2 weeks ago and had a persistently elevated hemoglobin A1c and was told to increase her Metformin to 3 times daily.  She did not do that but instead is still taking Metformin twice daily.  States she has not missed any doses.  Reports swelling to her feet and decreased urinary output since last night.  Also reports constipation x3 to 4 days; usually has a daily BM.  Denies fever, cough, chest pain, shortness of breath, abdominal pain, nausea, vomiting or diarrhea. Denies recent travel, trauma, or OCP use.       Past Medical History:  Diagnosis Date  . Depression   . GDM (gestational diabetes mellitus)    fourth pregnancy  . History of chlamydia 2011  . History of IUFD    D/t Abruption HELLP  . Migraines   . Premature labor 11/29/2011   35 weeks  . Pyelonephritis 2011    Patient Active Problem List   Diagnosis Date Noted  . GDM (gestational diabetes mellitus)   . History of IUFD   . Depression   . Premature labor 11/29/2011  . History of chlamydia 07/09/2009  . Pyelonephritis 07/09/2009    Past Surgical History:  Procedure Laterality Date  . tubal ligation  10/10/2014   [facility]    Prior to Admission medications   Medication Sig Start Date End Date Taking? Authorizing Provider  ALPRAZolam (XANAX) 0.25 MG tablet Take 0.25 mg by mouth 2 (two) times daily as needed for anxiety. 08/11/19  Yes  [provider]  HYDROcodone-acetaminophen (NORCO/VICODIN) 5-325 MG tablet Take 1 tablet by mouth every 6 (six) hours as needed for moderate pain. 02/22/19  Yes Letitia Neri L, PA-C  ibuprofen (ADVIL) 800 MG tablet Take 800 mg by mouth every 8 (eight) hours as needed. 04/29/12  Yes [provider]  LATUDA 60 MG TABS Take 60 mg by mouth daily. 06/24/19  Yes [provider]  metFORMIN (GLUCOPHAGE) 850 MG tablet Take 1 tablet (850 mg total) by mouth 2 (two) times daily with a meal. 08/04/19 08/03/20 Yes Merlyn Lot, MD  oxyCODONE (ROXICODONE) 5 MG immediate release tablet Take 1 tablet (5 mg total) by mouth every 8 (eight) hours as needed. 10/07/19 10/06/20 Yes Earleen Newport, MD  QUEtiapine (SEROQUEL) 200 MG tablet Take 200 mg by mouth at bedtime. 06/22/19  Yes [provider]  gabapentin (NEURONTIN) 300 MG capsule Take 1 capsule (300 mg total) by mouth at bedtime. 08/14/19 09/13/19  Blake Divine, MD  lactulose (CHRONULAC) 10 GM/15ML solution Take 30 mLs (20 g total) by mouth daily as needed for mild constipation. 10/13/19   Paulette Blanch, MD  promethazine (PHENERGAN) 25 MG tablet Take 1 tablet (25 mg total) by mouth every 6 (six) hours as needed for nausea or vomiting. 06/02/16 02/22/19  Sable Feil, PA-C    Allergies Amoxicillin, Naproxen, and Tylenol [acetaminophen]  Family History  Problem Relation Age of Onset  . Hypertension Mother   . Hypertension Father   . Diabetes Paternal Grandmother        Type 1  . Sickle cell trait Other     Social History Social History   Tobacco Use  . Smoking status: Never Smoker  . Smokeless tobacco: Never Used  Substance Use Topics  . Alcohol use: No  . Drug use: No    Review of Systems  Constitutional: Positive for hyperglycemia.  No fever/chills Eyes: No visual changes. ENT: No sore throat. Cardiovascular: Denies chest pain. Respiratory: Denies shortness of breath. Gastrointestinal: No abdominal  pain.  No nausea, no vomiting.  No diarrhea.  No constipation. Genitourinary: Positive for retention.  Negative for dysuria. Musculoskeletal: Negative for back pain. Skin: Negative for rash. Neurological: Negative for headaches, focal weakness or numbness.   ____________________________________________   PHYSICAL EXAM:  VITAL SIGNS: ED Triage Vitals  Enc Vitals Group     BP 10/12/19 2043 125/76     Pulse Rate 10/12/19 2043 (!) 119     Resp 10/12/19 2043 18     Temp 10/12/19 2043 98.3 F (36.8 C)     Temp Source 10/12/19 2043 Oral     SpO2 10/12/19 2043 99 %     Weight 10/12/19 2045 110 lb (49.9 kg)     Height 10/12/19 2045 5' (1.524 m)     Head Circumference --      Peak Flow --      Pain Score 10/12/19 2056 10     Pain Loc --      Pain Edu? --      Excl. in GC? --     Constitutional: Alert and oriented. Well appearing and in no acute distress. Eyes: Conjunctivae are normal. PERRL. EOMI. Head: Atraumatic. Nose: No congestion/rhinnorhea. Mouth/Throat: Mucous membranes are moist.   Neck: No stridor.   Cardiovascular: Normal rate, regular rhythm. Grossly normal heart sounds.  Good peripheral circulation. Respiratory: Normal respiratory effort.  No retractions. Lungs CTAB. Gastrointestinal: Soft and nontender to light or deep palpation. No distention. No abdominal bruits. No CVA tenderness. Musculoskeletal: No lower extremity tenderness nor edema.  No appreciable feet or leg swelling.  Bilateral calves tender to palpation.  No joint effusions. Neurologic:  Normal speech and language. No gross focal neurologic deficits are appreciated. No gait instability. Skin:  Skin is warm, dry and intact. No rash noted. Psychiatric: Mood and affect are normal. Speech and behavior are normal.  ____________________________________________   LABS (all labs ordered are listed, but only abnormal results are displayed)  Labs Reviewed  GLUCOSE, CAPILLARY - Abnormal; Notable for the  following components:      Result Value   Glucose-Capillary 463 (*)    All other components within normal limits  CBC WITH DIFFERENTIAL/PLATELET - Abnormal; Notable for the following components:   Hemoglobin 11.7 (*)    MCV 75.3 (*)    MCH 24.3 (*)    All other components within normal limits  COMPREHENSIVE METABOLIC PANEL - Abnormal; Notable for the following components:   Sodium 129 (*)    CO2 19 (*)    Glucose, Bld 480 (*)    Calcium 8.8 (*)    All other components within normal limits  URINALYSIS, COMPLETE (UACMP) WITH MICROSCOPIC - Abnormal; Notable for the following components:   Color, Urine STRAW (*)    APPearance CLEAR (*)    Specific Gravity, Urine 1.037 (*)    Glucose, UA >=500 (*)  All other components within normal limits  GLUCOSE, CAPILLARY - Abnormal; Notable for the following components:   Glucose-Capillary 100 (*)    All other components within normal limits  BRAIN NATRIURETIC PEPTIDE  POCT PREGNANCY, URINE   ____________________________________________  EKG  None  ____________________________________________  RADIOLOGY  ED MD interpretation: No acute cardiopulmonary process; no DVTs; left ovarian cyst, right nephrolithiasis, moderate stool burden on CT scan  Official radiology report(s): DG Chest 2 View  Result Date: 10/12/2019 CLINICAL DATA:  Bilateral leg swelling. EXAM: CHEST - 2 VIEW COMPARISON:  April 06, 2019 FINDINGS: The heart size and mediastinal contours are within normal limits. Both lungs are clear. The visualized skeletal structures are unremarkable. IMPRESSION: No active cardiopulmonary disease. Electronically Signed   By: Aram Candela M.D.   On: 10/12/2019 23:54   US Venous Img Lower Bilateral (DVT)  Result Date: 10/13/2019 CLINICAL DATA:  Initial evaluation for bilateral leg swelling, pain. EXAM: BILATERAL LOWER EXTREMITY VENOUS DOPPLER ULTRASOUND TECHNIQUE: Gray-scale sonography with graded compression, as well as color Doppler  and duplex ultrasound were performed to evaluate the lower extremity deep venous systems from the level of the common femoral vein and including the common femoral, femoral, profunda femoral, popliteal and calf veins including the posterior tibial, peroneal and gastrocnemius veins when visible. The superficial great saphenous vein was also interrogated. Spectral Doppler was utilized to evaluate flow at rest and with distal augmentation maneuvers in the common femoral, femoral and popliteal veins. COMPARISON:  None available. FINDINGS: RIGHT LOWER EXTREMITY Common Femoral Vein: No evidence of thrombus. Normal compressibility, respiratory phasicity and response to augmentation. Saphenofemoral Junction: No evidence of thrombus. Normal compressibility and flow on color Doppler imaging. Profunda Femoral Vein: No evidence of thrombus. Normal compressibility and flow on color Doppler imaging. Femoral Vein: No evidence of thrombus. Normal compressibility, respiratory phasicity and response to augmentation. Popliteal Vein: No evidence of thrombus. Normal compressibility, respiratory phasicity and response to augmentation. Calf Veins: No evidence of thrombus. Normal compressibility and flow on color Doppler imaging. Superficial Great Saphenous Vein: No evidence of thrombus. Normal compressibility. Venous Reflux:  None. Other Findings:  None. LEFT LOWER EXTREMITY Common Femoral Vein: No evidence of thrombus. Normal compressibility, respiratory phasicity and response to augmentation. Saphenofemoral Junction: No evidence of thrombus. Normal compressibility and flow on color Doppler imaging. Profunda Femoral Vein: No evidence of thrombus. Normal compressibility and flow on color Doppler imaging. Femoral Vein: No evidence of thrombus. Normal compressibility, respiratory phasicity and response to augmentation. Popliteal Vein: No evidence of thrombus. Normal compressibility, respiratory phasicity and response to augmentation. Calf  Veins: No evidence of thrombus. Normal compressibility and flow on color Doppler imaging. Superficial Great Saphenous Vein: No evidence of thrombus. Normal compressibility. Venous Reflux:  None. Other Findings:  None. IMPRESSION: No evidence of deep venous thrombosis in either lower extremity. Electronically Signed   By: Rise Mu M.D.   On: 10/13/2019 02:51   CT Renal Stone Study  Result Date: 10/13/2019 CLINICAL DATA:  Anuria with lower leg swelling and elevated blood sugars. EXAM: CT ABDOMEN AND PELVIS WITHOUT CONTRAST TECHNIQUE: Multidetector CT imaging of the abdomen and pelvis was performed following the standard protocol without IV contrast. COMPARISON:  June 08, 2015 FINDINGS: Lower chest: No acute abnormality. Hepatobiliary: No focal liver abnormality is seen. No gallstones, gallbladder wall thickening, or biliary dilatation. Pancreas: Unremarkable. No pancreatic ductal dilatation or surrounding inflammatory changes. Spleen: Normal in size without focal abnormality. Adrenals/Urinary Tract: Adrenal glands are unremarkable. The kidneys are normal in size without  focal lesions or hydronephrosis. A 3 mm nonobstructing renal stone is seen within the posterior aspect of the mid right kidney. The urinary bladder is markedly distended and otherwise normal in appearance. Stomach/Bowel: Stomach is within normal limits. Appendix appears normal. No evidence of bowel dilatation. A large amount of stool is seen throughout the colon. Vascular/Lymphatic: No significant vascular findings are present. No enlarged abdominal or pelvic lymph nodes. Reproductive: The uterus is unremarkable. A 3.4 cm x 2.8 cm left adnexal cyst is seen. Other: No abdominal wall hernia or abnormality. No abdominopelvic ascites. Musculoskeletal: No acute or significant osseous findings. IMPRESSION: 1. 3 mm nonobstructing renal stone is seen within the mid right kidney. 2. Left adnexal cyst, likely ovarian in origin. 3. Large amount  of stool throughout the colon. Electronically Signed   By: Aram Candela M.D.   On: 10/13/2019 03:29    ____________________________________________   PROCEDURES  Procedure(s) performed (including Critical Care):  Procedures   ____________________________________________   INITIAL IMPRESSION / ASSESSMENT AND PLAN / ED COURSE  As part of my medical decision making, I reviewed the following data within the electronic MEDICAL RECORD NUMBER Nursing notes reviewed and incorporated, Labs reviewed, EKG interpreted, Old chart reviewed, Radiograph reviewed and Notes from prior ED visits     Chelsea Vazquez was evaluated in Emergency Department on 10/13/2019 for the symptoms described in the history of present illness. She was evaluated in the context of the global COVID-19 pandemic, which necessitated consideration that the patient might be at risk for infection with the SARS-CoV-2 virus that causes COVID-19. Institutional protocols and algorithms that pertain to the evaluation of patients at risk for COVID-19 are in a state of rapid change based on information released by regulatory bodies including the CDC and federal and state organizations. These policies and algorithms were followed during the patient's care in the ED.    29 year old female on Metformin for diabetes presenting with bilateral feet swelling and urinary retention.  Differential diagnosis includes but is not limited to CHF, constipation, DKA, infectious, metabolic etiologies, etc.  Laboratory results demonstrate hyperglycemia without elevated anion gap.  Bladder scan demonstrates over 550 mL in bladder.  Patient was able to urinate approximately 300 cc.  Will obtain CT renal stone study to evaluate for obstructive uropathy, administer IV fluids, subcu insulin and reassess.  Obtain Doppler ultrasounds of bilateral legs to evaluate for DVT.   Clinical Course as of Oct 12 648  Tue Oct 13, 2019  9924 Patient sleeping soundly.   Awakened her to update her of all test and imaging results.  Advised her to follow her doctor's advice and to increase Metformin 3 times daily.  Will prescribe lactulose to use as needed for bowel movements.  Strict return precautions given.  Patient verbalizes understanding and agrees with plan of care.   [JS]    Clinical Course User Index [JS] Irean Hong, MD     ____________________________________________   FINAL CLINICAL IMPRESSION(S) / ED DIAGNOSES  Final diagnoses:  Hyperglycemia  Constipation, unspecified constipation type  Cyst of left ovary  Nephrolithiasis     ED Discharge Orders         Ordered    lactulose (CHRONULAC) 10 GM/15ML solution  Daily PRN     10/13/19 0555           Note:  This document was prepared using Dragon voice recognition software and may include unintentional dictation errors.   Irean Hong, MD 10/13/19 [phone]

## 2019-10-13 NOTE — Discharge Instructions (Signed)
1.  You may take Lactulose as needed for bowel movements. 2.  I recommend increasing your Metformin to 3 times daily as suggested by your doctor. 3.  Return to the ER for worsening symptoms, persistent vomiting, difficulty breathing or other concerns.

## 2019-10-13 NOTE — ED Notes (Signed)
Upon entering room this RN introduced self to pt. Pt is alert and oriented, pt appears to be tired and states that she feels about the same as she did when she first arrived to ED. Pt denies any needs at this time.

## 2019-11-20 ENCOUNTER — Emergency Department
Admission: EM | Admit: 2019-11-20 | Discharge: 2019-11-20 | Disposition: A | Discharge status: Home / Self Care | Payer: Medicaid Other | Attending: Emergency Medicine | Admitting: Emergency Medicine

## 2019-11-20 ENCOUNTER — Encounter: Payer: Self-pay | Admitting: Emergency Medicine

## 2019-11-20 ENCOUNTER — Other Ambulatory Visit: Payer: Self-pay

## 2019-11-20 DIAGNOSIS — Z5321 Procedure and treatment not carried out due to patient leaving prior to being seen by health care provider: Secondary | ICD-10-CM | POA: Diagnosis not present

## 2019-11-20 DIAGNOSIS — R1032 Left lower quadrant pain: Secondary | ICD-10-CM | POA: Diagnosis not present

## 2019-11-20 LAB — COMPREHENSIVE METABOLIC PANEL
ALT: 10 U/L (ref 0–44)
AST: 23 U/L (ref 15–41)
Albumin: 3.4 g/dL — ABNORMAL LOW (ref 3.5–5.0)
Alkaline Phosphatase: 59 U/L (ref 38–126)
Anion gap: 11 (ref 5–15)
BUN: 7 mg/dL (ref 6–20)
CO2: 23 mmol/L (ref 22–32)
Calcium: 8.6 mg/dL — ABNORMAL LOW (ref 8.9–10.3)
Chloride: 96 mmol/L — ABNORMAL LOW (ref 98–111)
Creatinine, Ser: 0.56 mg/dL (ref 0.44–1.00)
GFR calc Af Amer: >60 mL/min (ref >60)
GFR calc non Af Amer: >60 mL/min (ref >60)
Glucose, Bld: 482 mg/dL — ABNORMAL HIGH (ref 70–99)
Potassium: 3.7 mmol/L (ref 3.5–5.1)
Sodium: 130 mmol/L — ABNORMAL LOW (ref 135–145)
Total Bilirubin: 0.6 mg/dL (ref 0.3–1.2)
Total Protein: 7.3 g/dL (ref 6.5–8.1)

## 2019-11-20 LAB — CBC
HCT: 37 % (ref 36.0–46.0)
Hemoglobin: 11.9 g/dL — ABNORMAL LOW (ref 12.0–15.0)
MCH: 24.3 pg — ABNORMAL LOW (ref 26.0–34.0)
MCHC: 32.2 g/dL (ref 30.0–36.0)
MCV: 75.7 fL — ABNORMAL LOW (ref 80.0–100.0)
Platelets: 303 10*3/uL (ref 150–400)
RBC: 4.89 MIL/uL (ref 3.87–5.11)
RDW: 15.1 % (ref 11.5–15.5)
WBC: 8.3 10*3/uL (ref 4.0–10.5)
nRBC: 0 % (ref 0.0–0.2)

## 2019-11-20 LAB — LIPASE, BLOOD: Lipase: 23 U/L (ref 11–51)

## 2019-11-20 NOTE — ED Notes (Signed)
Pt called x2 for VS

## 2019-11-20 NOTE — ED Triage Notes (Signed)
Pt c/o LLQ pain starting earlier today.  Hx of ovarian cyst but feels different per pt.  Pain across lower back. Reports when wipes after urination sees blood.  Hx of kidney stones as well but feels different as well.  No nausea or vomiting. Has had some diarrhea. No fevers.

## 2019-11-20 NOTE — ED Notes (Signed)
Pt called x 2 for VS, no answer.

## 2019-11-24 ENCOUNTER — Telehealth: Payer: Self-pay | Admitting: Emergency Medicine

## 2019-11-24 NOTE — Telephone Encounter (Signed)
Called patient due to lwot to inquire about condition and follow up plans.  She has a pcp.  Says her stomach still hurts at times.  I asked her to call the pcp to let them know about her pain. I told her that she can always return here for that, but that there may be a wait.   I also informed her of her glucose which was 482.  Says she is on metformin.  I told her to let her pcp know about the high glucose as well.  She agrees to call.

## 2020-02-11 ENCOUNTER — Emergency Department
Admission: EM | Admit: 2020-02-11 | Discharge: 2020-02-11 | Disposition: A | Discharge status: Home / Self Care | Payer: Medicaid Other | Attending: Emergency Medicine | Admitting: Emergency Medicine

## 2020-02-11 ENCOUNTER — Other Ambulatory Visit: Payer: Self-pay

## 2020-02-11 ENCOUNTER — Encounter: Payer: Self-pay | Admitting: Emergency Medicine

## 2020-02-11 DIAGNOSIS — R519 Headache, unspecified: Secondary | ICD-10-CM | POA: Diagnosis not present

## 2020-02-11 DIAGNOSIS — J029 Acute pharyngitis, unspecified: Secondary | ICD-10-CM | POA: Diagnosis present

## 2020-02-11 DIAGNOSIS — R6883 Chills (without fever): Secondary | ICD-10-CM | POA: Insufficient documentation

## 2020-02-11 DIAGNOSIS — Z5321 Procedure and treatment not carried out due to patient leaving prior to being seen by health care provider: Secondary | ICD-10-CM | POA: Insufficient documentation

## 2020-02-11 NOTE — ED Triage Notes (Signed)
Pt here with c/o sore throat, ha and chills since yesterday, NAD. Denies covid exposure.

## 2020-03-16 ENCOUNTER — Telehealth: Payer: Medicaid Other | Admitting: Family

## 2020-03-16 DIAGNOSIS — Z20822 Contact with and (suspected) exposure to covid-19: Secondary | ICD-10-CM

## 2020-03-16 NOTE — Progress Notes (Signed)
E-Visit for [employer] Virus Screening According to your questioner, you do not have any symptoms, However if you wish to be tested please see below.   Many health care providers can now test patients at their office but not all are.  [facility] has multiple testing sites. For information on our COVID testing locations and hours go to [email or web address]   Testing Information: The COVID-19 Community Testing sites are testing BY APPOINTMENT ONLY.  You can schedule online at [email or web address]  If you do not have access to a smart phone or computer you may call [phone] for an appointment.   Additional testing sites in the Community:  . For [facility] Testing sites in [facility]  [email or web address]  . For [facility] testing sites in [place]  [email or web address]  . For [facility] [email or web address]  . For [facility] testing in [place] and [employer] [email or web address]  . For [facility] testing in [facility]   [email or web address]  For  more information about community testing call [phone]   Please quarantine yourself while awaiting your test results. Please stay home for a minimum of 10 days from the first day of illness with improving symptoms and you have had 24 hours of no fever (without the use of Tylenol (Acetaminophen) Motrin (Ibuprofen) or any fever reducing medication).  Also - Do not get tested prior to returning to work because once you have had a positive test the test can stay positive for more than a month in some cases.   You should wear a mask or cloth face covering over your nose and mouth if you must be around other people  or animals, including pets (even at home). Try to stay at least 6 feet away from other people. This will protect the people around you.  Please continue good preventive care measures, including:  frequent hand-washing, avoid touching your face, cover coughs/sneezes, stay out of crowds and keep a 6 foot distance from others.  COVID-19 is a respiratory illness with symptoms that are similar to the flu. Symptoms are typically mild to moderate, but there have been cases of severe illness and death due to the virus.   The following symptoms may appear 2-14 days after exposure: . Fever . Cough . Shortness of breath or difficulty breathing . Chills . Repeated shaking with chills . Muscle pain . Headache . Sore throat . New loss of taste or smell . Fatigue . Congestion or runny nose . Nausea or vomiting . Diarrhea  Go to the nearest hospital ED for assessment if fever/cough/breathlessness are severe or illness seems like a threat to life.  It is vitally important that if you feel that you have an infection such as this virus or any other virus that you stay home and away from places where you may spread it to others.  You should avoid contact with people age 83 and older.  You may also take acetaminophen (Tylenol) as needed for fever.  Reduce your risk of any infection by using the same precautions used for avoiding the common cold or flu:  Marland Kitchen Wash your hands often with soap and warm water for at least 20 seconds.  If soap and water are not readily available, use an alcohol-based hand sanitizer with at least 60% alcohol.  . If coughing or sneezing, cover your mouth and nose by coughing or sneezing into the elbow areas of your shirt or coat, into a tissue or into your sleeve (not your hands). . Avoid shaking hands with  others and consider head nods or verbal greetings only. . Avoid touching your eyes, nose, or mouth with unwashed hands.  . Avoid close contact with people who are sick. . Avoid places  or events with large numbers of people in one location, like concerts or sporting events. . Carefully consider travel plans you have or are making. . If you are planning any travel outside or inside the [place], visit the CDC's Travelers' Health webpage for the latest health notices. . If you have some symptoms but not all symptoms, continue to monitor at home and seek medical attention if your symptoms worsen. . If you are having a medical emergency, call 911.  HOME CARE . Only take medications as instructed by your medical team. . Drink plenty of fluids and get plenty of rest. . A steam or ultrasonic humidifier can help if you have congestion.   GET HELP RIGHT AWAY IF YOU HAVE EMERGENCY WARNING SIGNS** FOR COVID-19. If you or someone is showing any of these signs seek emergency medical care immediately. Call 911 or proceed to your closest emergency facility if: . You develop worsening high fever. . Trouble breathing . Bluish lips or face . Persistent pain or pressure in the chest . New confusion . Inability to wake or stay awake . You cough up blood. . Your symptoms become more severe  **This list is not all possible symptoms. Contact your medical provider for any symptoms that are sever or concerning to you.  MAKE SURE YOU   Understand these instructions.  Will watch your condition.  Will get help right away if you are not doing well or get worse.  Your e-visit answers were reviewed by a board certified advanced clinical practitioner to complete your personal care plan.  Depending on the condition, your plan could have included both over the counter or prescription medications.  If there is a problem please reply once you have received a response from your provider.  Your safety is important to [place].  If you have drug allergies check your prescription carefully.    You can use MyChart to ask questions about today's visit, request a non-urgent call back, or ask for a work or school  excuse for 24 hours related to this e-Visit. If it has been greater than 24 hours you will need to follow up with your provider, or enter a new e-Visit to address those concerns. You will get an e-mail in the next two days asking about your experience.  I hope that your e-visit has been valuable and will speed your recovery. Thank you for using e-visits.  Approximately 5 minutes was spent documenting and reviewing patient's chart.

## 2020-05-08 ENCOUNTER — Other Ambulatory Visit: Payer: Self-pay

## 2020-05-08 ENCOUNTER — Emergency Department
Admission: EM | Admit: 2020-05-08 | Discharge: 2020-05-08 | Disposition: A | Discharge status: Home / Self Care | Payer: Medicaid Other | Attending: Emergency Medicine | Admitting: Emergency Medicine

## 2020-05-08 DIAGNOSIS — M25511 Pain in right shoulder: Secondary | ICD-10-CM | POA: Diagnosis present

## 2020-05-08 DIAGNOSIS — M5412 Radiculopathy, cervical region: Secondary | ICD-10-CM | POA: Insufficient documentation

## 2020-05-08 MED ORDER — DEXAMETHASONE SODIUM PHOSPHATE 10 MG/ML IJ SOLN
10.0000 mg | Freq: Once | INTRAMUSCULAR | Status: AC
  Administered 2020-05-08: 10 mg via INTRAMUSCULAR
  Filled 2020-05-08: qty 1

## 2020-05-08 MED ORDER — ORPHENADRINE CITRATE 30 MG/ML IJ SOLN
60.0000 mg | Freq: Once | INTRAMUSCULAR | Status: AC
  Administered 2020-05-08: 60 mg via INTRAMUSCULAR
  Filled 2020-05-08: qty 2

## 2020-05-08 MED ORDER — PREDNISONE 50 MG PO TABS: 50.0000 mg | ORAL_TABLET | Freq: Every day | ORAL | 0 refills | Status: DC

## 2020-05-08 MED ORDER — METHOCARBAMOL 500 MG PO TABS: 500.0000 mg | ORAL_TABLET | Freq: Four times a day (QID) | ORAL | 0 refills | Status: DC

## 2020-05-08 NOTE — ED Triage Notes (Signed)
Pt states she is not able to move right arm without pain.

## 2020-05-08 NOTE — ED Triage Notes (Addendum)
Pt states she moved some air conditioners a few days ago and began to experience right sided neck stiffness, pain and right arm stiffness, tingling and pain. Pt denies shob, chest pain, fever. Pt states is not able to move right arm from pain. Pt appears in no distress.

## 2020-05-08 NOTE — ED Provider Notes (Signed)
[facility] Emergency Department Provider Note  ____________________________________________  Time seen: Approximately 10:12 PM  I have reviewed the triage vital signs and the nursing notes.   HISTORY  Chief Complaint Arm Pain    HPI Chelsea Vazquez is a 29 y.o. female who presents emergency department complaining of right shoulder/right neck/right arm pain.  Patient states that she was moving air conditioning unit 2 to 3 days ago, did not have any symptoms at that time.  She has developed pain radiating from her right neck down her right arm.  There is no direct trauma to her neck.  She denies any headache, neck stiffness.  She states that the pain is limiting her range of motion to her right arm.  No numbness or tingling in the arm.  Patient denies any other complaints at this time.  She has not tried any medications at home.         Past Medical History:  Diagnosis Date  . Depression   . GDM (gestational diabetes mellitus)    fourth pregnancy  . History of chlamydia 2011  . History of IUFD    D/t Abruption HELLP  . Migraines   . Premature labor 11/29/2011   35 weeks  . Pyelonephritis 2011    Patient Active Problem List   Diagnosis Date Noted  . GDM (gestational diabetes mellitus)   . History of IUFD   . Depression   . Premature labor 11/29/2011  . History of chlamydia 07/09/2009  . Pyelonephritis 07/09/2009    Past Surgical History:  Procedure Laterality Date  . tubal ligation  10/10/2014   [facility]    Prior to Admission medications   Medication Sig Start Date End Date Taking? Authorizing Provider  ALPRAZolam (XANAX) 0.25 MG tablet Take 0.25 mg by mouth 2 (two) times daily as needed for anxiety. 08/11/19   [provider]  gabapentin (NEURONTIN) 300 MG capsule Take 1 capsule (300 mg total) by mouth at bedtime. 08/14/19 09/13/19  Chesley Noon, MD  HYDROcodone-acetaminophen (NORCO/VICODIN) 5-325 MG tablet Take 1 tablet by mouth  every 6 (six) hours as needed for moderate pain. 02/22/19   Tommi Rumps, PA-C  ibuprofen (ADVIL) 800 MG tablet Take 800 mg by mouth every 8 (eight) hours as needed. 04/29/12   [provider]  lactulose (CHRONULAC) 10 GM/15ML solution Take 30 mLs (20 g total) by mouth daily as needed for mild constipation. 10/13/19   Irean Hong, MD  LATUDA 60 MG TABS Take 60 mg by mouth daily. 06/24/19   [provider]  metFORMIN (GLUCOPHAGE) 850 MG tablet Take 1 tablet (850 mg total) by mouth 2 (two) times daily with a meal. 08/04/19 08/03/20  Willy Eddy, MD  methocarbamol (ROBAXIN) 500 MG tablet Take 1 tablet (500 mg total) by mouth 4 (four) times daily. 05/08/20   Zenaida Tesar, Delorise Royals, PA-C  oxyCODONE (ROXICODONE) 5 MG immediate release tablet Take 1 tablet (5 mg total) by mouth every 8 (eight) hours as needed. 10/07/19 10/06/20  Emily Filbert, MD  predniSONE (DELTASONE) 50 MG tablet Take 1 tablet (50 mg total) by mouth daily with breakfast. 05/08/20   Sherre Wooton, Delorise Royals, PA-C  QUEtiapine (SEROQUEL) 200 MG tablet Take 200 mg by mouth at bedtime. 06/22/19   [provider]  promethazine (PHENERGAN) 25 MG tablet Take 1 tablet (25 mg total) by mouth every 6 (six) hours as needed for nausea or vomiting. 06/02/16 02/22/19  Joni Reining, PA-C    Allergies Amoxicillin,  Naproxen, and Tylenol [acetaminophen]  Family History  Problem Relation Age of Onset  . Hypertension Mother   . Hypertension Father   . Diabetes Paternal Grandmother        Type 1  . Sickle cell trait Other     Social History Social History   Tobacco Use  . Smoking status: Never Smoker  . Smokeless tobacco: Never Used  Substance Use Topics  . Alcohol use: No  . Drug use: No     Review of Systems  Constitutional: No fever/chills Eyes: No visual changes. No discharge ENT: No upper respiratory complaints. Cardiovascular: no chest pain. Respiratory: no cough. No SOB. Gastrointestinal:  No abdominal pain.  No nausea, no vomiting.  No diarrhea.  No constipation.   Musculoskeletal: Right neck/right shoulder/right arm pain Skin: Negative for rash, abrasions, lacerations, ecchymosis. Neurological: Negative for headaches, focal weakness or numbness.  10 System ROS otherwise negative.  ____________________________________________   PHYSICAL EXAM:  VITAL SIGNS: ED Triage Vitals  Enc Vitals Group     BP 05/08/20 2116 117/82     Pulse Rate 05/08/20 2116 92     Resp 05/08/20 2116 16     Temp --      Temp Source 05/08/20 2116 Oral     SpO2 05/08/20 2116 100 %     Weight 05/08/20 2117 110 lb (49.9 kg)     Height 05/08/20 2117 5' (1.524 m)     Head Circumference --      Peak Flow --      Pain Score 05/08/20 2116 10     Pain Loc --      Pain Edu? --      Excl. in GC? --      Constitutional: Alert and oriented. Well appearing and in no acute distress. Eyes: Conjunctivae are normal. PERRL. EOMI. Head: Atraumatic. ENT:      Ears:       Nose: No congestion/rhinnorhea.      Mouth/Throat: Mucous membranes are moist.  Neck: No stridor.  No midline cervical spine tenderness to palpation.  Tenderness along the right paraspinal muscle group extending into the right trapezius muscle.  No palpable abnormality.  Cardiovascular: Normal rate, regular rhythm. Normal S1 and S2.  Good peripheral circulation. Respiratory: Normal respiratory effort without tachypnea or retractions. Lungs CTAB. Good air entry to the bases with no decreased or absent breath sounds. Musculoskeletal: Full range of motion to all extremities. No gross deformities appreciated.  Limited range of motion due to pain.  However when I walked into the room, patient was using the right upper extremity to type on her phone.  During the exam, patient awkwardly held her arm at her side and would not perform range of motion even when asked.  Palpation revealed diffuse tenderness from the cervical spine as described above all  the way through her shoulder, both anterior, posterior, superior aspect extending through the entire right upper extremity.  There does not appear to be one area that is more tender than others.  No palpable abnormality.  Radial pulse and sensation intact distally. Neurologic:  Normal speech and language. No gross focal neurologic deficits are appreciated.  Skin:  Skin is warm, dry and intact. No rash noted. Psychiatric: Mood and affect are normal. Speech and behavior are normal. Patient exhibits appropriate insight and judgement.   ____________________________________________   LABS (all labs ordered are listed, but only abnormal results are displayed)  Labs Reviewed - No data to display ____________________________________________  EKG  ____________________________________________  RADIOLOGY   No results found.  ____________________________________________    PROCEDURES  Procedure(s) performed:    Procedures    Medications  orphenadrine (NORFLEX) injection 60 mg (has no administration in time range)  dexamethasone (DECADRON) injection 10 mg (has no administration in time range)     ____________________________________________   INITIAL IMPRESSION / ASSESSMENT AND PLAN / ED COURSE  Pertinent labs & imaging results that were available during my care of the patient were reviewed by me and considered in my medical decision making (see chart for details).  Review of the [place] CSRS was performed in accordance of the [facility] prior to dispensing any controlled drugs.           Patient's diagnosis is consistent with cervical radiculopathy.  Patient presented with diffuse pain from her neck down her right arm.  On presentation to the room, patient was using the right upper extremity appropriately to text on her cell phone.  Patient then immediately informed me that she could not move her right arm, held it awkwardly at her side and would not perform any range of motion to  the shoulder, elbow, wrist.  Patient reported tenderness from the right paraspinal muscle group all the way down to her hand.  There was no palpable abnormality.  No reported trauma.  Patient had been performing some heavy lifting several days ago before onset of symptoms.  During my conversation regarding my diagnosis, patient began using the right upper extremity again to text on her phone.  This time she will receive Decadron, Norflex here in the emergency department and a prescription for prednisone and Robaxin.  Patient is reportedly allergic to naproxen with throat swelling, thus I will use a steroid..Follow-up primary care as needed.  Patient is given ED precautions to return to the ED for any worsening or new symptoms.     ____________________________________________  FINAL CLINICAL IMPRESSION(S) / ED DIAGNOSES  Final diagnoses:  Cervical radiculopathy      NEW MEDICATIONS STARTED DURING THIS VISIT:  ED Discharge Orders         Ordered    predniSONE (DELTASONE) 50 MG tablet  Daily with breakfast        05/08/20 2226    methocarbamol (ROBAXIN) 500 MG tablet  4 times daily        05/08/20 2226              This chart was dictated using voice recognition software/Dragon. Despite best efforts to proofread, errors can occur which can change the meaning. Any change was purely unintentional.    Racheal Patches, PA-C 05/08/20 2227    Arnaldo Natal, MD 05/09/20 0004

## 2020-07-14 ENCOUNTER — Emergency Department
Admission: EM | Admit: 2020-07-14 | Discharge: 2020-07-14 | Discharge status: Against Medical Advice | Payer: Medicaid Other

## 2020-11-30 ENCOUNTER — Other Ambulatory Visit: Payer: Self-pay

## 2020-11-30 ENCOUNTER — Emergency Department
Admission: EM | Admit: 2020-11-30 | Discharge: 2020-11-30 | Disposition: A | Discharge status: Home / Self Care | Payer: Medicaid Other | Attending: Emergency Medicine | Admitting: Emergency Medicine

## 2020-11-30 ENCOUNTER — Emergency Department: Payer: Medicaid Other

## 2020-11-30 DIAGNOSIS — N3090 Cystitis, unspecified without hematuria: Secondary | ICD-10-CM

## 2020-11-30 DIAGNOSIS — K529 Noninfective gastroenteritis and colitis, unspecified: Secondary | ICD-10-CM

## 2020-11-30 DIAGNOSIS — R112 Nausea with vomiting, unspecified: Secondary | ICD-10-CM

## 2020-11-30 DIAGNOSIS — E860 Dehydration: Secondary | ICD-10-CM | POA: Insufficient documentation

## 2020-11-30 LAB — URINALYSIS, COMPLETE (UACMP) WITH MICROSCOPIC
Bilirubin Urine: NEGATIVE
Glucose, UA: >=500 mg/dL — AB
Ketones, ur: NEGATIVE mg/dL
Leukocytes,Ua: NEGATIVE
Nitrite: POSITIVE — AB
Protein, ur: NEGATIVE mg/dL
Specific Gravity, Urine: 1.039 — ABNORMAL HIGH (ref 1.005–1.030)
pH: 5 (ref 5.0–8.0)

## 2020-11-30 LAB — COMPREHENSIVE METABOLIC PANEL
ALT: 14 U/L (ref 0–44)
AST: 23 U/L (ref 15–41)
Albumin: 3.2 g/dL — ABNORMAL LOW (ref 3.5–5.0)
Alkaline Phosphatase: 61 U/L (ref 38–126)
Anion gap: 12 (ref 5–15)
BUN: 7 mg/dL (ref 6–20)
CO2: 19 mmol/L — ABNORMAL LOW (ref 22–32)
Calcium: 8.5 mg/dL — ABNORMAL LOW (ref 8.9–10.3)
Chloride: 104 mmol/L (ref 98–111)
Creatinine, Ser: 0.37 mg/dL — ABNORMAL LOW (ref 0.44–1.00)
GFR, Estimated: >60 mL/min (ref >60)
Glucose, Bld: 275 mg/dL — ABNORMAL HIGH (ref 70–99)
Potassium: 3.7 mmol/L (ref 3.5–5.1)
Sodium: 135 mmol/L (ref 135–145)
Total Bilirubin: 0.6 mg/dL (ref 0.3–1.2)
Total Protein: 7.2 g/dL (ref 6.5–8.1)

## 2020-11-30 LAB — CBC
HCT: 35.1 % — ABNORMAL LOW (ref 36.0–46.0)
Hemoglobin: 11 g/dL — ABNORMAL LOW (ref 12.0–15.0)
MCH: 23.8 pg — ABNORMAL LOW (ref 26.0–34.0)
MCHC: 31.3 g/dL (ref 30.0–36.0)
MCV: 76 fL — ABNORMAL LOW (ref 80.0–100.0)
Platelets: 274 10*3/uL (ref 150–400)
RBC: 4.62 MIL/uL (ref 3.87–5.11)
RDW: 15.3 % (ref 11.5–15.5)
WBC: 7.8 10*3/uL (ref 4.0–10.5)
nRBC: 0 % (ref 0.0–0.2)

## 2020-11-30 LAB — LIPASE, BLOOD: Lipase: 38 U/L (ref 11–51)

## 2020-11-30 LAB — POC URINE PREG, ED: Preg Test, Ur: NEGATIVE

## 2020-11-30 MED ORDER — ONDANSETRON 4 MG PO TBDP: 4.0000 mg | ORAL_TABLET | Freq: Three times a day (TID) | ORAL | 0 refills | Status: DC | PRN

## 2020-11-30 MED ORDER — SULFAMETHOXAZOLE-TRIMETHOPRIM 800-160 MG PO TABS: 1.0000 | ORAL_TABLET | Freq: Two times a day (BID) | ORAL | 0 refills | Status: AC

## 2020-11-30 MED ORDER — LACTATED RINGERS IV BOLUS
2000.0000 mL | Freq: Once | INTRAVENOUS | Status: AC
  Administered 2020-11-30: 2000 mL via INTRAVENOUS

## 2020-11-30 MED ORDER — DICYCLOMINE HCL 10 MG PO CAPS: 10.0000 mg | ORAL_CAPSULE | Freq: Three times a day (TID) | ORAL | 0 refills | Status: DC | PRN

## 2020-11-30 MED ORDER — MORPHINE SULFATE (PF) 2 MG/ML IV SOLN
2.0000 mg | Freq: Once | INTRAVENOUS | Status: AC
  Administered 2020-11-30: 2 mg via INTRAVENOUS
  Filled 2020-11-30: qty 1

## 2020-11-30 MED ORDER — METOCLOPRAMIDE HCL 5 MG/ML IJ SOLN
10.0000 mg | Freq: Once | INTRAMUSCULAR | Status: AC
  Administered 2020-11-30: 10 mg via INTRAVENOUS
  Filled 2020-11-30: qty 2

## 2020-11-30 MED ORDER — ONDANSETRON HCL 4 MG/2ML IJ SOLN
4.0000 mg | Freq: Once | INTRAMUSCULAR | Status: AC
  Administered 2020-11-30: 4 mg via INTRAVENOUS
  Filled 2020-11-30: qty 2

## 2020-11-30 NOTE — ED Notes (Signed)
Pt verbalized understanding of d/c instructions at this time. Prescriptions and follow-up care reviewed. Pt declined wheelchair to exit at this time. Pt ambulatory to ED lobby, NAD noted, steady gait noted.

## 2020-11-30 NOTE — ED Provider Notes (Signed)
-----------------------------------------   8:52 PM on 11/30/2020 -----------------------------------------  I took over care of this patient from Dr. Katrinka Blazing.  The plan was reassessment after fluids and nausea medication.  On my reassessment a few hours ago, the patient continued to have nausea and was unable to tolerate p.o. liquids.  I gave a dose of Reglan.  The patient reported some mild abdominal pain as well, so 2 mg of morphine were given.  I obtained a CT which shows:  Enterocolitis: this is consistent with the patient's symptoms  Questionable edematous changes of the pancreas: lipase is normal and there is no clinical evidence of pancreatitis  Possible cystitis: Urinalysis shows equivocal findings for UTI including positive nitrites.  The patient denies any urinary symptoms.  I will treat empirically for possible cystitis with Bactrim.  3 cm left adnexal cyst: The patient has no left lower quadrant or pelvic pain.  There is no clinical evidence for ovarian torsion or other complication.  And there is no recommendation for follow-up imaging per radiology.  On reassessment, the patient states she feels much better especially after the Reglan.  She was able to tolerate p.o.  She would like to go home.  She is stable for discharge at this time.  I counseled her on the results of the work-up.  She will be prescribed Zofran, Bentyl, and the Bactrim.  Return precautions given, and she expresses understanding.    Dionne Bucy, MD 11/30/20 2055

## 2020-11-30 NOTE — ED Provider Notes (Signed)
[facility] Emergency Department Provider Note ____________________________________________   Event Date/Time   First MD Initiated Contact with Patient 11/30/20 1352     (approximate)  I have reviewed the triage vital signs and the nursing notes.  HISTORY  Chief Complaint Abdominal Pain   HPI Chelsea Vazquez is a 30 y.o. femalewho presents to the ED for evaluation of nausea, vomiting and diarrhea.  Chart review indicates no relevant history.  Patient resents to the ED via POV with her boyfriend for evaluation of 3 days of recurrent nausea, vomiting and diarrhea.  She reports eating food at [facility] on Sunday, and developing symptoms shortly thereafter.  Reports watery diarrhea and nonbloody nonbilious emesis, 5-10 episodes per day, for the past 3 days.  Boyfriend at bedside reports he had similar symptoms for 1 day after eating the same food, but he is already improved.  Patient denies any abdominal pain, just mild cramping and nausea.  Denies cannabis use.  Denies chest pain, fever, bloody diarrhea, dysuria or vaginal bleeding/discharge.   Past Medical History:  Diagnosis Date  . Depression   . GDM (gestational diabetes mellitus)    fourth pregnancy  . History of chlamydia 2011  . History of IUFD    D/t Abruption HELLP  . Migraines   . Premature labor 11/29/2011   35 weeks  . Pyelonephritis 2011    Patient Active Problem List   Diagnosis Date Noted  . GDM (gestational diabetes mellitus)   . History of IUFD   . Depression   . Premature labor 11/29/2011  . History of chlamydia 07/09/2009  . Pyelonephritis 07/09/2009    Past Surgical History:  Procedure Laterality Date  . tubal ligation  10/10/2014   [facility]    Prior to Admission medications   Medication Sig Start Date End Date Taking? Authorizing Provider  ALPRAZolam (XANAX) 0.25 MG tablet Take 0.25 mg by mouth 2 (two) times daily as needed for anxiety. 08/11/19   [provider]  gabapentin (NEURONTIN) 300 MG capsule Take 1 capsule (300 mg total) by mouth at bedtime. 08/14/19 09/13/19  Chesley Noon, MD  HYDROcodone-acetaminophen (NORCO/VICODIN) 5-325 MG tablet Take 1 tablet by mouth every 6 (six) hours as needed for moderate pain. 02/22/19   Tommi Rumps, PA-C  ibuprofen (ADVIL) 800 MG tablet Take 800 mg by mouth every 8 (eight) hours as needed. 04/29/12   [provider]  lactulose (CHRONULAC) 10 GM/15ML solution Take 30 mLs (20 g total) by mouth daily as needed for mild constipation. 10/13/19   Irean Hong, MD  LATUDA 60 MG TABS Take 60 mg by mouth daily. 06/24/19   [provider]  metFORMIN (GLUCOPHAGE) 850 MG tablet Take 1 tablet (850 mg total) by mouth 2 (two) times daily with a meal. 08/04/19 08/03/20  Willy Eddy, MD  methocarbamol (ROBAXIN) 500 MG tablet Take 1 tablet (500 mg total) by mouth 4 (four) times daily. 05/08/20   Cuthriell, Delorise Royals, PA-C  predniSONE (DELTASONE) 50 MG tablet Take 1 tablet (50 mg total) by mouth daily with breakfast. 05/08/20   Cuthriell, Delorise Royals, PA-C  QUEtiapine (SEROQUEL) 200 MG tablet Take 200 mg by mouth at bedtime. 06/22/19   [provider]  promethazine (PHENERGAN) 25 MG tablet Take 1 tablet (25 mg total) by mouth every 6 (six) hours as needed for nausea or vomiting. 06/02/16 02/22/19  Joni Reining, PA-C    Allergies Amoxicillin, Naproxen, and Tylenol [acetaminophen]  Family History  Problem Relation Age  of Onset  . Hypertension Mother   . Hypertension Father   . Diabetes Paternal Grandmother        Type 1  . Sickle cell trait Other     Social History Social History   Tobacco Use  . Smoking status: Never Smoker  . Smokeless tobacco: Never Used  Substance Use Topics  . Alcohol use: No  . Drug use: No    Review of Systems  Constitutional: No fever/chills Eyes: No visual changes. ENT: No sore throat. Cardiovascular: Denies chest pain. Respiratory: Denies  shortness of breath. Gastrointestinal: Positive for abdominal cramping, nausea, vomiting and diarrhea.  No constipation. Genitourinary: Negative for dysuria. Musculoskeletal: Negative for back pain. Skin: Negative for rash. Neurological: Negative for headaches, focal weakness or numbness.  ____________________________________________   PHYSICAL EXAM:  VITAL SIGNS: Vitals:   11/30/20 1324 11/30/20 1400  BP: 109/81 108/78  Pulse: (!) 118 (!) 108  Resp: 16 16  Temp: 98.2 F (36.8 C)   SpO2: 100% 100%    Constitutional: Alert and oriented. Well appearing and in no acute distress. Eyes: Conjunctivae are normal. PERRL. EOMI. Head: Atraumatic. Nose: No congestion/rhinnorhea. Mouth/Throat: Mucous membranes are dry.  Oropharynx non-erythematous. Neck: No stridor. No cervical spine tenderness to palpation. Cardiovascular: Tachycardic rate, regular rhythm. Grossly normal heart sounds.  Good peripheral circulation. Respiratory: Normal respiratory effort.  No retractions. Lungs CTAB. Gastrointestinal: Soft , nondistended, nontender to palpation. No CVA tenderness.  Benign abdomen. Musculoskeletal: No lower extremity tenderness nor edema.  No joint effusions. No signs of acute trauma. Neurologic:  Normal speech and language. No gross focal neurologic deficits are appreciated. No gait instability noted. Skin:  Skin is warm, dry and intact. No rash noted. Psychiatric: Mood and affect are normal. Speech and behavior are normal. ____________________________________________   LABS (all labs ordered are listed, but only abnormal results are displayed)  Labs Reviewed  COMPREHENSIVE METABOLIC PANEL - Abnormal; Notable for the following components:      Result Value   CO2 19 (*)    Glucose, Bld 275 (*)    Creatinine, Ser 0.37 (*)    Calcium 8.5 (*)    Albumin 3.2 (*)    All other components within normal limits  URINALYSIS, COMPLETE (UACMP) WITH MICROSCOPIC - Abnormal; Notable for the  following components:   Color, Urine YELLOW (*)    APPearance CLOUDY (*)    Specific Gravity, Urine 1.039 (*)    Glucose, UA >=500 (*)    Hgb urine dipstick MODERATE (*)    Nitrite POSITIVE (*)    Bacteria, UA MANY (*)    All other components within normal limits  CBC - Abnormal; Notable for the following components:   Hemoglobin 11.0 (*)    HCT 35.1 (*)    MCV 76.0 (*)    MCH 23.8 (*)    All other components within normal limits  LIPASE, BLOOD  POC URINE PREG, ED    ____________________________________________   PROCEDURES and INTERVENTIONS  Procedure(s) performed (including Critical Care):  Procedures  Medications  lactated ringers bolus 2,000 mL (2,000 mLs Intravenous New Bag/Given 11/30/20 1448)  ondansetron (ZOFRAN) injection 4 mg (4 mg Intravenous Given 11/30/20 1442)    ____________________________________________   MDM / ED COURSE   Healthy 30 year old female presents to the ED with recurrent emesis and diarrhea, possibly food poisoning versus viral GI bug.  Presents tachycardic, likely due to dehydration, but remains hemodynamically stable throughout.  Exam with stigmata of dehydration.  She has benign abdomen without pain  or tenderness upon palpation.  Blood work with decreased bicarbonate suggestive of dehydration, but normal electrolytes.  Urine with high specific gravity further suggestive of dehydration.  Noted to be nitrite positive, but no leukocytes.  She has no urinary symptoms to suggest UTI and we will therefore not start antibiotics for this.  We will provide antiemetics and fluid resuscitation.  Patient signed out to oncoming provider to follow-up with her clinical reassessment, anticipate she be suitable for outpatient management.      ____________________________________________   FINAL CLINICAL IMPRESSION(S) / ED DIAGNOSES  Final diagnoses:  Dehydration  Nausea vomiting and diarrhea     ED Discharge Orders    None       Chelsea Vazquez   Note:  This document was prepared using Dragon voice recognition software and may include unintentional dictation errors.   Delton Prairie, MD 11/30/20 [phone]

## 2020-11-30 NOTE — ED Triage Notes (Signed)
Pt c/o N/V/d for the past 3 days

## 2020-11-30 NOTE — ED Triage Notes (Signed)
First Nurse Note:  C/O N/V/D x 3 days.

## 2020-11-30 NOTE — ED Notes (Signed)
See triage note. Pt states constant N/V/D, chills, and body pain since eating some food from [place] on Sunday. Pt states decreased PO intake, unable to hold anything down at this time. Pt noted to be in NAD, ambulatory to ED25A at this time.   Pt given warm blanket at this time.

## 2020-11-30 NOTE — ED Notes (Signed)
Pt given cup of water at this time. MD Siadecki okay with PO intake at this time.

## 2020-11-30 NOTE — Discharge Instructions (Signed)
You may use the Zofran (ondansetron) as needed for nausea, and the bentyl (dicyclomine) as needed for abdominal pain or cramping.  Take the antibiotic (TMP/SMX) as prescribed for the full 3 days.  Gradually advance your diet.  Return to the ER for new, worsening, or persistent severe vomiting, abdominal pain, fever, blood in the stool, urinary symptoms, or any other new or worsening symptoms that concern you.

## 2021-01-29 IMAGING — CR DG CHEST 2V
1 series · 2 of 2 positions shown · non-contrast
Comparison: April 06, 2019

CLINICAL DATA: Bilateral leg swelling.

EXAM:
CHEST - 2 VIEW

[Series 1: dg chest 2 view · 0.14mm/px · 2 of 2 slices shown]
[im 1/2]
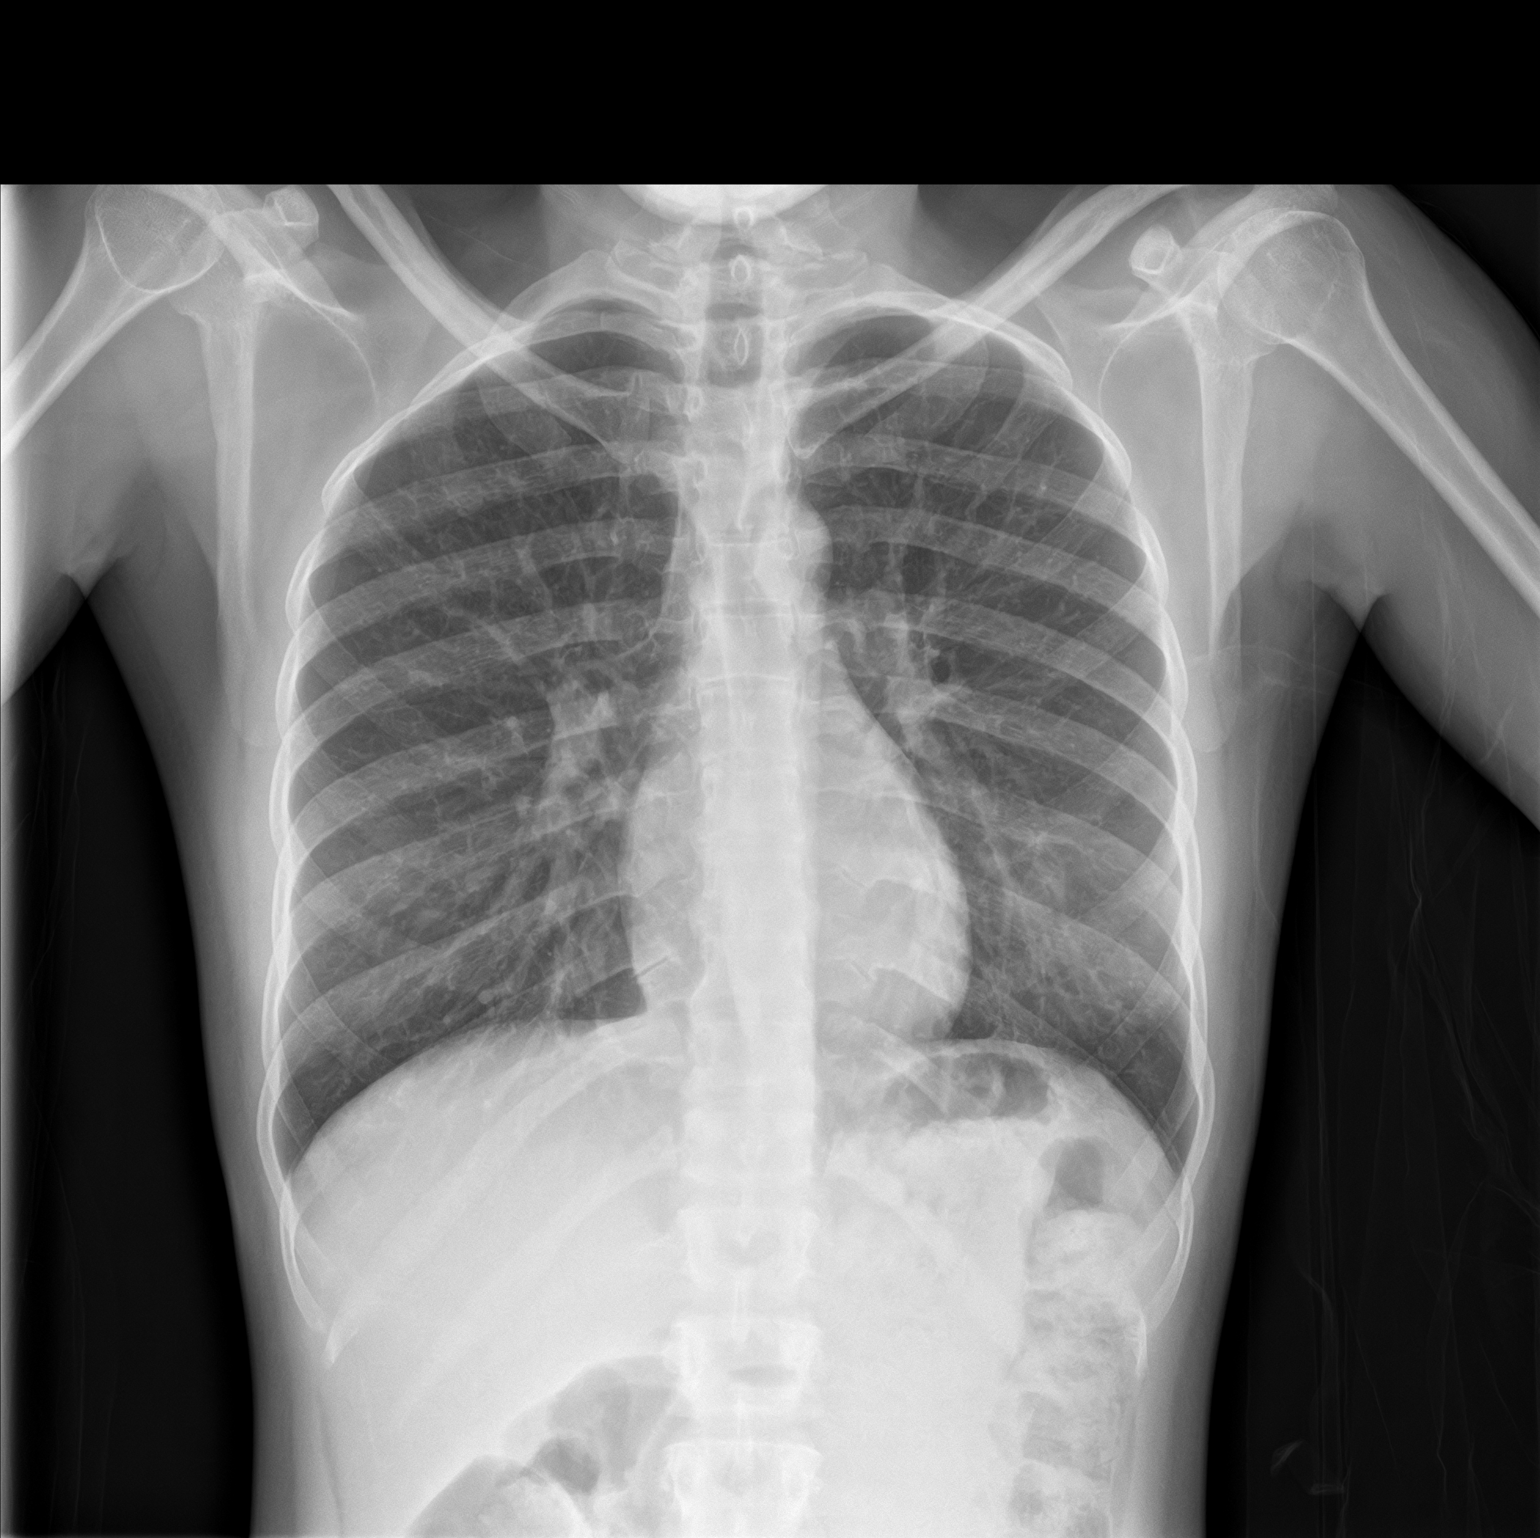
[im 2/2]
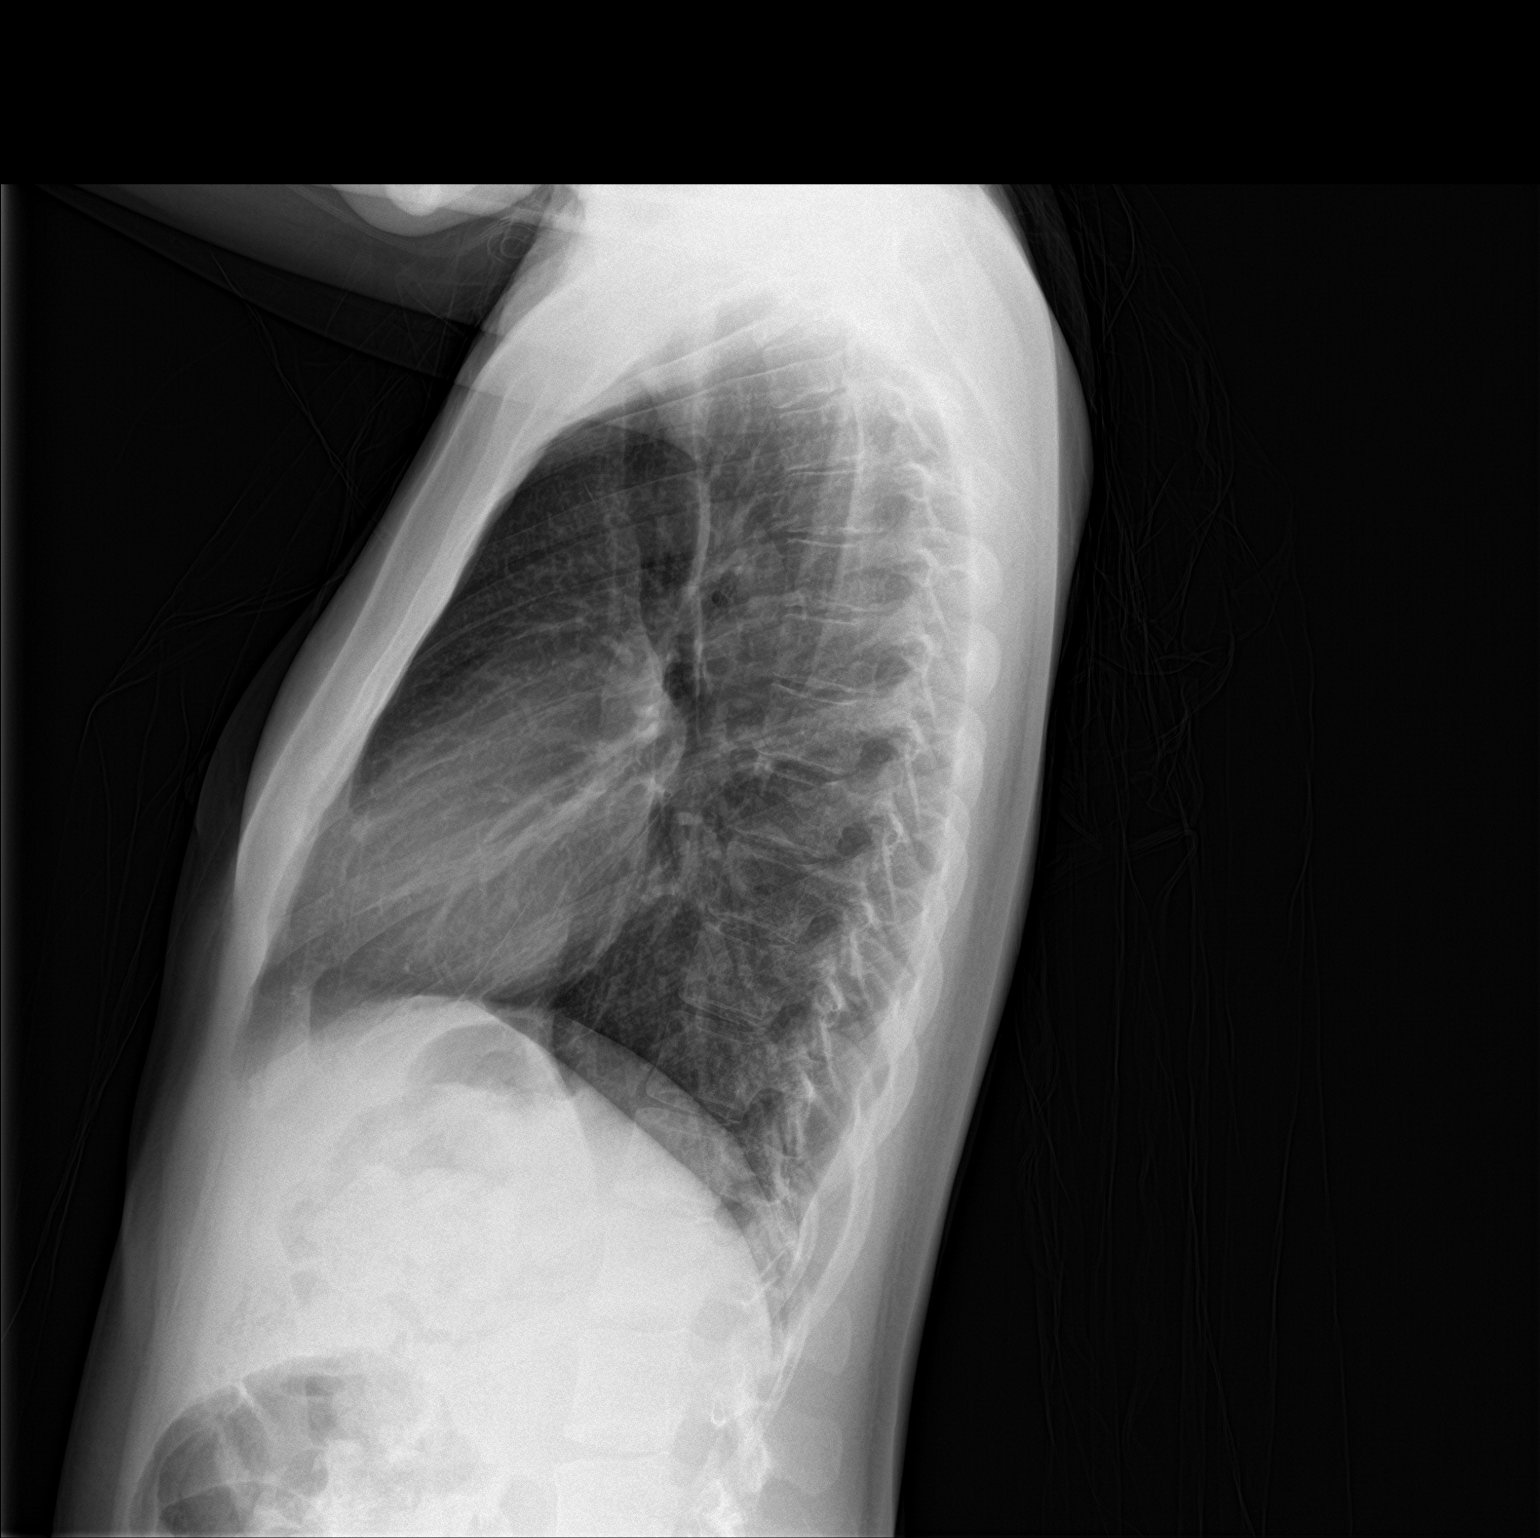

[2 of 2 positions shown; findings below may reference images not displayed]

FINDINGS: The heart size and mediastinal contours are within normal limits.
Both lungs are clear. The visualized skeletal structures are
unremarkable.
IMPRESSION: No active cardiopulmonary disease.

## 2021-01-30 IMAGING — US US EXTREM LOW VENOUS
1 series · 13 of 24 positions shown · non-contrast
Comparison: None available.

CLINICAL DATA: Initial evaluation for bilateral leg swelling, pain.



[Series 1: us venous img lower bilat (dvt) · portal-venous · 13 of 53 slices shown]
[im 1/53]
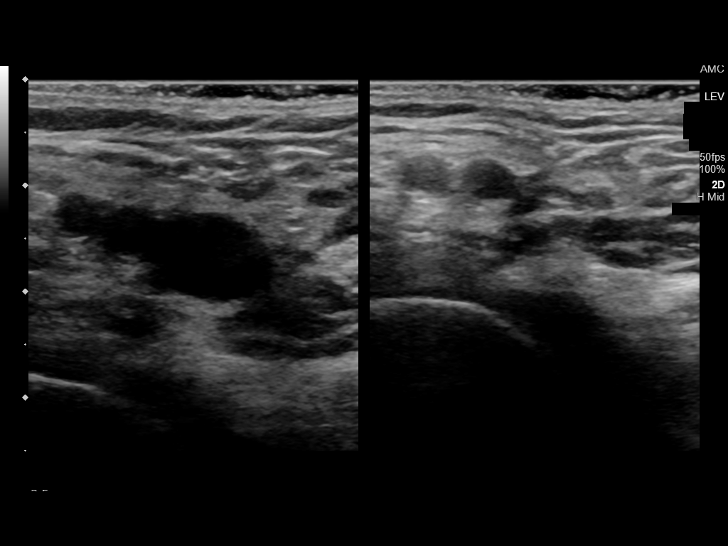
[im 5/53]
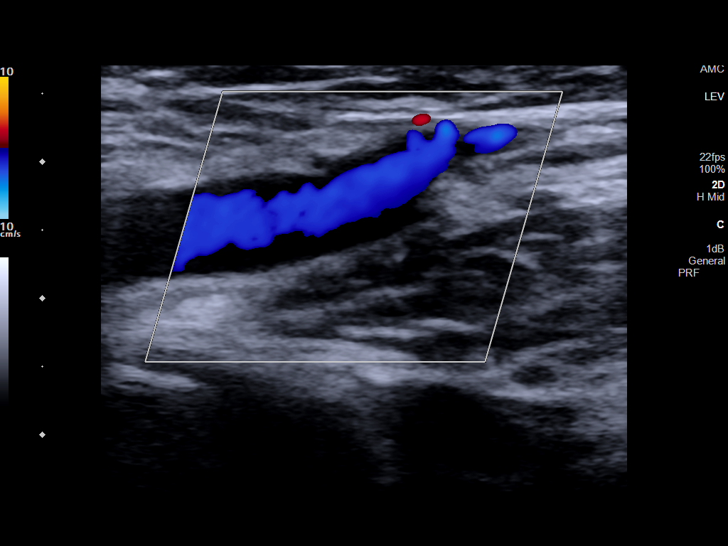
[im 10/53]
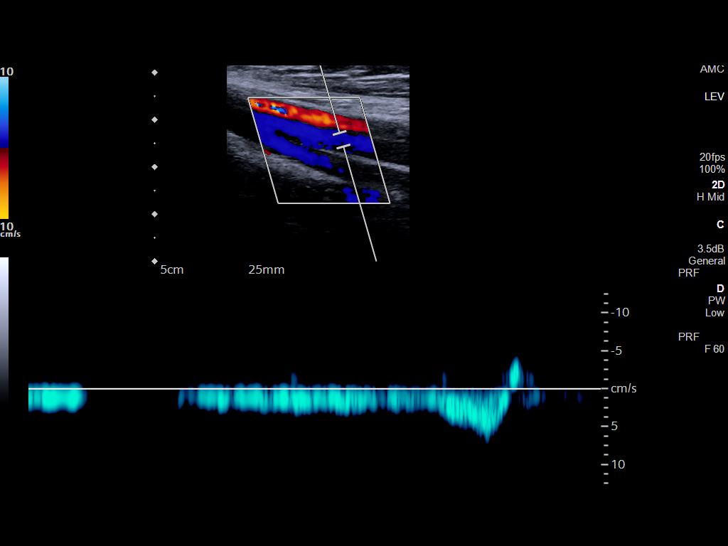
[im 14/53]
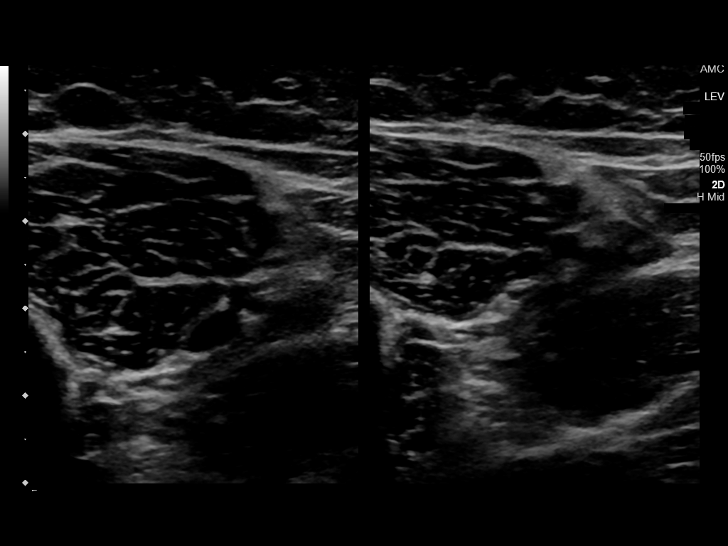
[im 19/53]
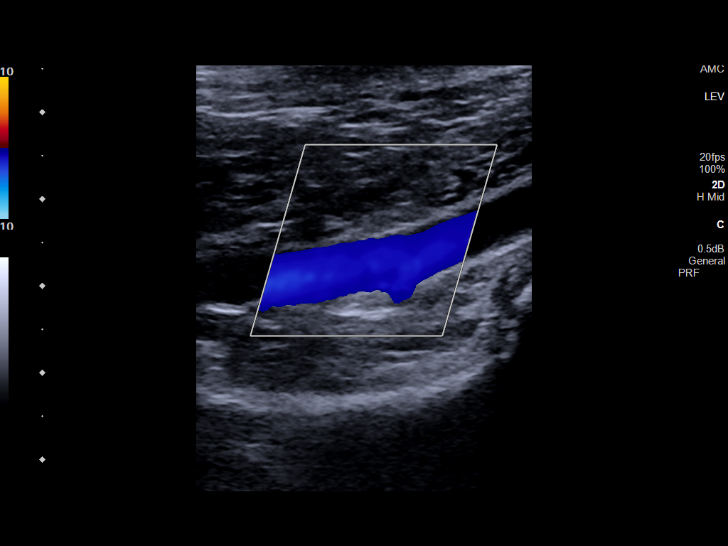
[im 23/53]
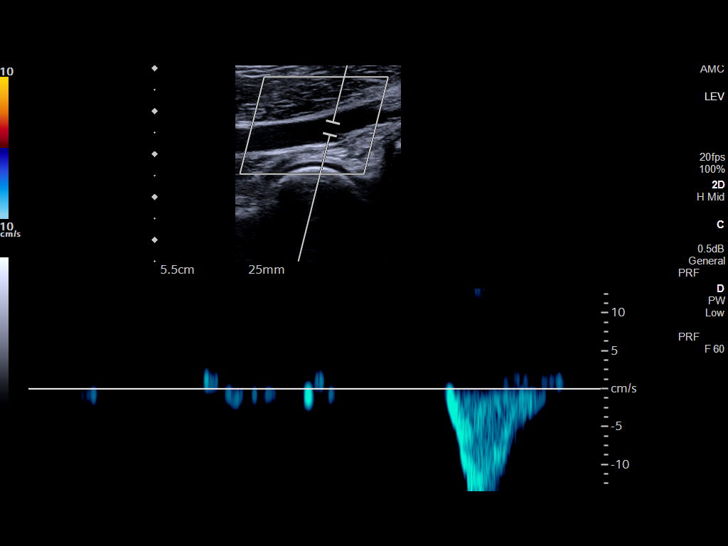
[im 28/53]
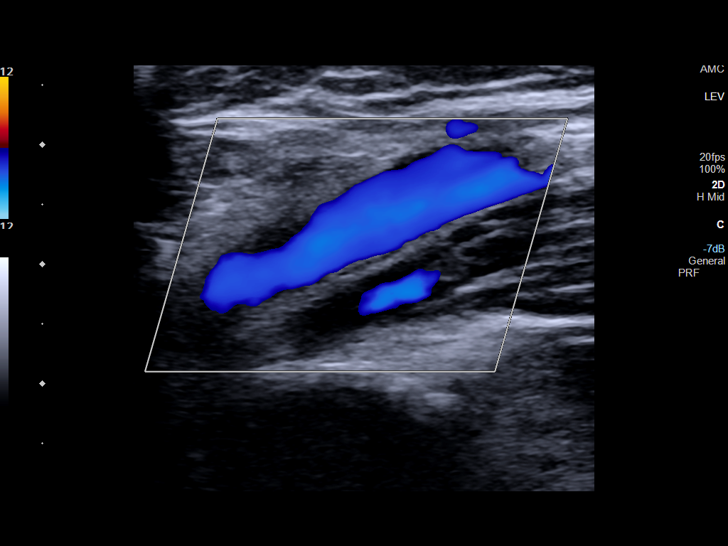
[im 30/53]
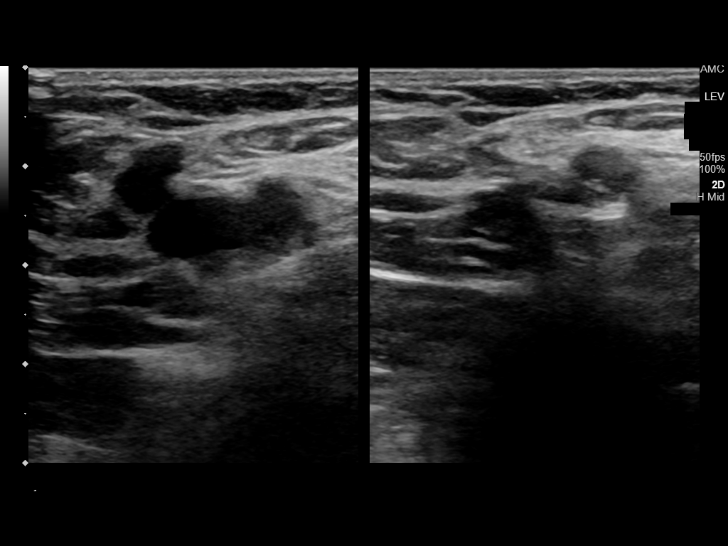
[im 34/53]
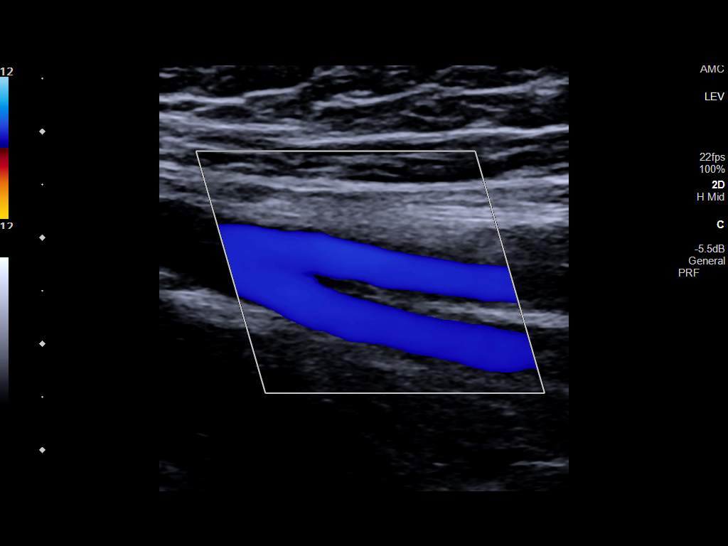
[im 39/53]
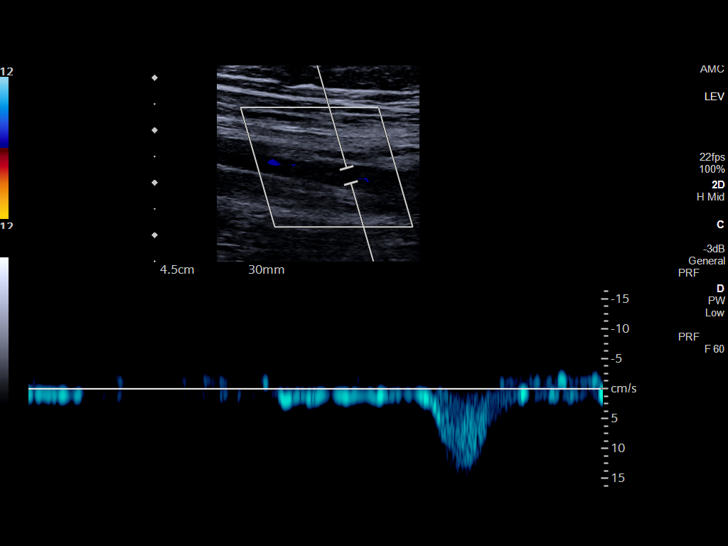
[im 43/53]
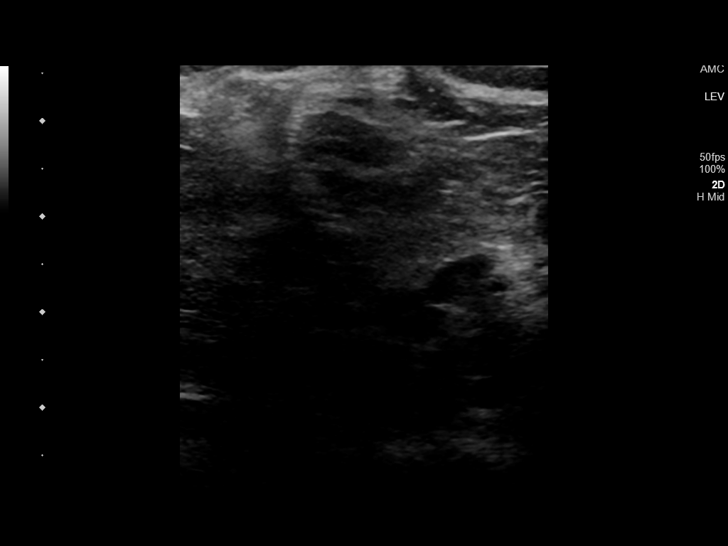
[im 48/53]
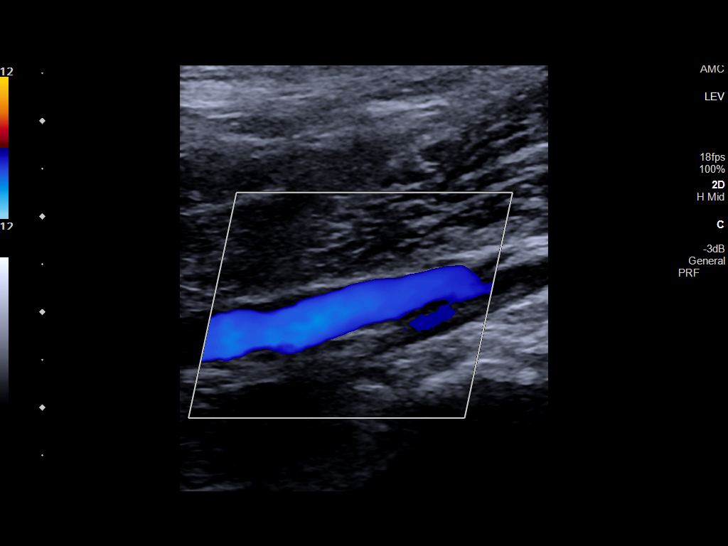
[im 53/53]
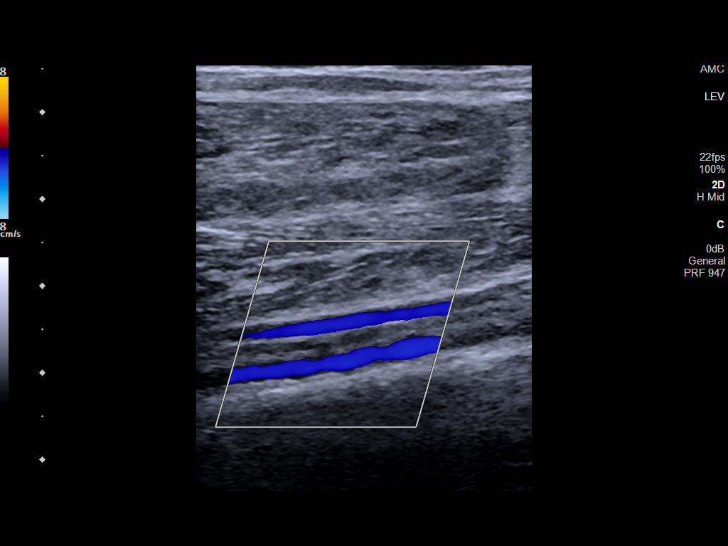

[13 of 24 positions shown; findings below may reference images not displayed]

FINDINGS: RIGHT LOWER EXTREMITY

Common Femoral Vein: No evidence of thrombus. Normal
compressibility, respiratory phasicity and response to augmentation.

Saphenofemoral Junction: No evidence of thrombus. Normal
compressibility and flow on color Doppler imaging.

Profunda Femoral Vein: No evidence of thrombus. Normal
compressibility and flow on color Doppler imaging.

Femoral Vein: No evidence of thrombus. Normal compressibility,
respiratory phasicity and response to augmentation.

Popliteal Vein: No evidence of thrombus. Normal compressibility,
respiratory phasicity and response to augmentation.

Calf Veins: No evidence of thrombus. Normal compressibility and flow
on color Doppler imaging.

Superficial Great Saphenous Vein: No evidence of thrombus. Normal
compressibility.

Venous Reflux:  None.

Other Findings:  None.

LEFT LOWER EXTREMITY

Common Femoral Vein: No evidence of thrombus. Normal
compressibility, respiratory phasicity and response to augmentation.

Saphenofemoral Junction: No evidence of thrombus. Normal
compressibility and flow on color Doppler imaging.

Profunda Femoral Vein: No evidence of thrombus. Normal
compressibility and flow on color Doppler imaging.

Femoral Vein: No evidence of thrombus. Normal compressibility,
respiratory phasicity and response to augmentation.

Popliteal Vein: No evidence of thrombus. Normal compressibility,
respiratory phasicity and response to augmentation.

Calf Veins: No evidence of thrombus. Normal compressibility and flow
on color Doppler imaging.

Superficial Great Saphenous Vein: No evidence of thrombus. Normal
compressibility.

Venous Reflux:  None.

Other Findings:  None.
IMPRESSION: No evidence of deep venous thrombosis in either lower extremity.

## 2021-04-27 ENCOUNTER — Emergency Department
Admission: EM | Admit: 2021-04-27 | Discharge: 2021-04-27 | Disposition: A | Discharge status: Home / Self Care | Payer: Medicaid Other | Attending: Emergency Medicine | Admitting: Emergency Medicine

## 2021-04-27 ENCOUNTER — Other Ambulatory Visit: Payer: Self-pay

## 2021-04-27 DIAGNOSIS — Z20822 Contact with and (suspected) exposure to covid-19: Secondary | ICD-10-CM | POA: Insufficient documentation

## 2021-04-27 DIAGNOSIS — B338 Other specified viral diseases: Secondary | ICD-10-CM

## 2021-04-27 DIAGNOSIS — B974 Respiratory syncytial virus as the cause of diseases classified elsewhere: Secondary | ICD-10-CM | POA: Diagnosis not present

## 2021-04-27 DIAGNOSIS — Z7984 Long term (current) use of oral hypoglycemic drugs: Secondary | ICD-10-CM | POA: Insufficient documentation

## 2021-04-27 DIAGNOSIS — R519 Headache, unspecified: Secondary | ICD-10-CM | POA: Insufficient documentation

## 2021-04-27 LAB — RESP PANEL BY RT-PCR (FLU A&B, COVID) ARPGX2
Influenza A by PCR: NEGATIVE
Influenza B by PCR: NEGATIVE
SARS Coronavirus 2 by RT PCR: NEGATIVE

## 2021-04-27 LAB — GROUP A STREP BY PCR: Group A Strep by PCR: NOT DETECTED

## 2021-04-27 NOTE — Discharge Instructions (Addendum)
You can take Tylenol 1 g every 6-8 hours and ibuprofen 600 with food every 8 hours to help with any of your pain

## 2021-04-27 NOTE — ED Provider Notes (Signed)
HPI: Pt is a 30 y.o. female who presents with complaints of cough and congestion, sore throat as well.   The patient p/w  sore throat, covid symptoms.   ROS: Denies fever, chest pain, vomiting  Past Medical History:  Diagnosis Date   Depression    GDM (gestational diabetes mellitus)    fourth pregnancy   History of chlamydia 2011   History of IUFD    D/t Abruption HELLP   Migraines    Premature labor 11/29/2011   35 weeks   Pyelonephritis 2011   There were no vitals filed for this visit.  Focused Physical Exam: Gen: No acute distress Head: atraumatic, normocephalic Eyes: Extraocular movements grossly intact; conjunctiva clear CV: RRR Lung: No increased WOB, no stridor GI: ND, no obvious masses Neuro: Alert and awake  Medical Decision Making and Plan: Given the patient's initial medical screening exam, the following diagnostic evaluation has been ordered. The patient will be placed in the appropriate treatment space, once one is available, to complete the evaluation and treatment. I have discussed the plan of care with the patient and I have advised the patient that an ED physician or mid-level practitioner will reevaluate their condition after the test results have been received, as the results may give them additional insight into the type of treatment they may need.   Diagnostics: covid test   Treatments: none immediately   Concha Se, MD 04/27/21 [phone]

## 2021-04-27 NOTE — ED Triage Notes (Signed)
Pt c/o HA with cough congestion and HA, sore throat with loss of taste and smell for the past couple of days , son has similar sx

## 2021-04-27 NOTE — ED Provider Notes (Addendum)
[facility] Emergency Department Provider Note  ____________________________________________   None    (approximate)  I have reviewed the triage vital signs and the nursing notes.   HISTORY  Chief Complaint URI    HPI Chelsea Vazquez is a 30 y.o. female with history of depression who comes in with concern for headache, sore throat, loss of taste and smell concern for possible COVID.  Son had similar symptoms.  Symptoms have been going on for a few days, constant, nothing makes it better or worse          Past Medical History:  Diagnosis Date   Depression    GDM (gestational diabetes mellitus)    fourth pregnancy   History of chlamydia 2011   History of IUFD    D/t Abruption HELLP   Migraines    Premature labor 11/29/2011   35 weeks   Pyelonephritis 2011    Patient Active Problem List   Diagnosis Date Noted   GDM (gestational diabetes mellitus)    History of IUFD    Depression    Premature labor 11/29/2011   History of chlamydia 07/09/2009   Pyelonephritis 07/09/2009    Past Surgical History:  Procedure Laterality Date   tubal ligation  10/10/2014   [facility]    Prior to Admission medications   Medication Sig Start Date End Date Taking? Authorizing Provider  ALPRAZolam (XANAX) 0.25 MG tablet Take 0.25 mg by mouth 2 (two) times daily as needed for anxiety. 08/11/19   [provider]  dicyclomine (BENTYL) 10 MG capsule Take 1 capsule (10 mg total) by mouth 3 (three) times daily as needed for up to 5 days (abdominal pain/cramping). 11/30/20 12/05/20  Dionne Bucy, MD  gabapentin (NEURONTIN) 300 MG capsule Take 1 capsule (300 mg total) by mouth at bedtime. 08/14/19 09/13/19  Chesley Noon, MD  HYDROcodone-acetaminophen (NORCO/VICODIN) 5-325 MG tablet Take 1 tablet by mouth every 6 (six) hours as needed for moderate pain. 02/22/19   Tommi Rumps, PA-C  ibuprofen (ADVIL) 800 MG tablet Take 800 mg by mouth every 8 (eight)  hours as needed. 04/29/12   [provider]  lactulose (CHRONULAC) 10 GM/15ML solution Take 30 mLs (20 g total) by mouth daily as needed for mild constipation. 10/13/19   Irean Hong, MD  LATUDA 60 MG TABS Take 60 mg by mouth daily. 06/24/19   [provider]  metFORMIN (GLUCOPHAGE) 850 MG tablet Take 1 tablet (850 mg total) by mouth 2 (two) times daily with a meal. 08/04/19 08/03/20  Willy Eddy, MD  methocarbamol (ROBAXIN) 500 MG tablet Take 1 tablet (500 mg total) by mouth 4 (four) times daily. 05/08/20   Cuthriell, Delorise Royals, PA-C  ondansetron (ZOFRAN ODT) 4 MG disintegrating tablet Take 1 tablet (4 mg total) by mouth every 8 (eight) hours as needed for nausea or vomiting. 11/30/20   Delton Prairie, MD  predniSONE (DELTASONE) 50 MG tablet Take 1 tablet (50 mg total) by mouth daily with breakfast. 05/08/20   Cuthriell, Delorise Royals, PA-C  QUEtiapine (SEROQUEL) 200 MG tablet Take 200 mg by mouth at bedtime. 06/22/19   [provider]  promethazine (PHENERGAN) 25 MG tablet Take 1 tablet (25 mg total) by mouth every 6 (six) hours as needed for nausea or vomiting. 06/02/16 02/22/19  Joni Reining, PA-C    Allergies Amoxicillin, Naproxen, and Tylenol [acetaminophen]  Family History  Problem Relation Age of Onset   Hypertension Mother    Hypertension Father  Diabetes Paternal Grandmother        Type 1   Sickle cell trait Other     Social History Social History   Tobacco Use   Smoking status: Never   Smokeless tobacco: Never  Substance Use Topics   Alcohol use: No   Drug use: No      Review of Systems Constitutional: No fever/chills Eyes: No visual changes. ENT: Congestion, sore throat Cardiovascular: Denies chest pain. Respiratory: Denies shortness of breath. Gastrointestinal: No abdominal pain.  No nausea, no vomiting.  No diarrhea.  No constipation. Genitourinary: Negative for dysuria. Musculoskeletal: Negative for back pain. Skin: Negative for  rash. Neurological: Mild headache, no focal weakness or numbness. All other ROS negative ____________________________________________   PHYSICAL EXAM:  VITAL SIGNS: ED Triage Vitals  Enc Vitals Group     BP 04/27/21 1000 112/88     Pulse Rate 04/27/21 1000 (!) 108     Resp 04/27/21 1000 18     Temp 04/27/21 1000 98.9 F (37.2 C)     Temp Source 04/27/21 1000 Oral     SpO2 04/27/21 1000 98 %     Weight --      Height --      Head Circumference --      Peak Flow --      Pain Score 04/27/21 0945 8     Pain Loc --      Pain Edu? --      Excl. in GC? --     Constitutional: Alert and oriented. Well appearing and in no acute distress. Eyes: Conjunctivae are normal. EOMI. Head: Atraumatic. Nose: Positive congestion Mouth/Throat: Mucous membranes are moist.  OP is clear Neck: No stridor. Trachea Midline. FROM Cardiovascular: Normal rate, regular rhythm. Grossly normal heart sounds.  Good peripheral circulation. Respiratory: Normal respiratory effort.  No retractions. Lungs CTAB. Gastrointestinal: Soft and nontender. No distention. No abdominal bruits.  Musculoskeletal: No lower extremity tenderness nor edema.  No joint effusions. Neurologic:  Normal speech and language. No gross focal neurologic deficits are appreciated.  Skin:  Skin is warm, dry and intact. No rash noted. Psychiatric: Mood and affect are normal. Speech and behavior are normal. GU: Deferred   ____________________________________________   LABS (all labs ordered are listed, but only abnormal results are displayed)  Labs Reviewed  RESP PANEL BY RT-PCR (FLU A&B, COVID) ARPGX2  GROUP A STREP BY PCR   ____________________________________________  INITIAL IMPRESSION / ASSESSMENT AND PLAN / ED COURSE  Chelsea Vazquez was evaluated in Emergency Department on 04/27/2021 for the symptoms described in the history of present illness. She was evaluated in the context of the global COVID-19 pandemic, which  necessitated consideration that the patient might be at risk for infection with the SARS-CoV-2 virus that causes COVID-19. Institutional protocols and algorithms that pertain to the evaluation of patients at risk for COVID-19 are in a state of rapid change based on information released by regulatory bodies including the CDC and federal and state organizations. These policies and algorithms were followed during the patient's care in the ED.    Patient very well-appearing COVID-negative flu versus COVID versus other viral illness.  Son was positive for RSV therefore her symptoms are most likely from RSV.  Patient looks well-hydrated although slightly tachycardic we encourage p.o. fluids. No abdominal pain.  Therefore preg test not done given no symptoms of pregnancy.          ____________________________________________   FINAL CLINICAL IMPRESSION(S) / ED DIAGNOSES  Final diagnoses:  RSV infection      MEDICATIONS GIVEN DURING THIS VISIT:  Medications - No data to display   ED Discharge Orders     None        Note:  This document was prepared using Dragon voice recognition software and may include unintentional dictation errors.    Concha Se, MD 04/27/21 1136    Concha Se, MD 04/27/21 [id number]

## 2021-07-30 ENCOUNTER — Other Ambulatory Visit: Payer: Self-pay

## 2021-07-30 ENCOUNTER — Emergency Department: Payer: Medicaid Other

## 2021-07-30 ENCOUNTER — Observation Stay: Payer: Medicaid Other

## 2021-07-30 ENCOUNTER — Inpatient Hospital Stay
Admission: EM | Admit: 2021-07-30 | Discharge: 2021-08-02 | DRG: 871 | Disposition: A | Discharge status: Home / Self Care | Payer: Medicaid Other | Attending: Internal Medicine | Admitting: Internal Medicine

## 2021-07-30 DIAGNOSIS — Z880 Allergy status to penicillin: Secondary | ICD-10-CM

## 2021-07-30 DIAGNOSIS — D509 Iron deficiency anemia, unspecified: Secondary | ICD-10-CM

## 2021-07-30 DIAGNOSIS — Z7984 Long term (current) use of oral hypoglycemic drugs: Secondary | ICD-10-CM

## 2021-07-30 DIAGNOSIS — E860 Dehydration: Secondary | ICD-10-CM | POA: Diagnosis not present

## 2021-07-30 DIAGNOSIS — E109 Type 1 diabetes mellitus without complications: Secondary | ICD-10-CM

## 2021-07-30 DIAGNOSIS — Z20822 Contact with and (suspected) exposure to covid-19: Secondary | ICD-10-CM | POA: Diagnosis present

## 2021-07-30 DIAGNOSIS — R6510 Systemic inflammatory response syndrome (SIRS) of non-infectious origin without acute organ dysfunction: Secondary | ICD-10-CM

## 2021-07-30 DIAGNOSIS — D72829 Elevated white blood cell count, unspecified: Secondary | ICD-10-CM

## 2021-07-30 DIAGNOSIS — R6520 Severe sepsis without septic shock: Secondary | ICD-10-CM | POA: Diagnosis present

## 2021-07-30 DIAGNOSIS — D75839 Thrombocytosis, unspecified: Secondary | ICD-10-CM

## 2021-07-30 DIAGNOSIS — A419 Sepsis, unspecified organism: Principal | ICD-10-CM | POA: Diagnosis present

## 2021-07-30 DIAGNOSIS — Z8249 Family history of ischemic heart disease and other diseases of the circulatory system: Secondary | ICD-10-CM

## 2021-07-30 DIAGNOSIS — E871 Hypo-osmolality and hyponatremia: Secondary | ICD-10-CM

## 2021-07-30 DIAGNOSIS — E0810 Diabetes mellitus due to underlying condition with ketoacidosis without coma: Secondary | ICD-10-CM | POA: Diagnosis not present

## 2021-07-30 DIAGNOSIS — F419 Anxiety disorder, unspecified: Secondary | ICD-10-CM | POA: Diagnosis present

## 2021-07-30 DIAGNOSIS — Z79899 Other long term (current) drug therapy: Secondary | ICD-10-CM

## 2021-07-30 DIAGNOSIS — E1165 Type 2 diabetes mellitus with hyperglycemia: Secondary | ICD-10-CM | POA: Diagnosis not present

## 2021-07-30 DIAGNOSIS — Z888 Allergy status to other drugs, medicaments and biological substances status: Secondary | ICD-10-CM

## 2021-07-30 DIAGNOSIS — F32A Depression, unspecified: Secondary | ICD-10-CM | POA: Diagnosis present

## 2021-07-30 DIAGNOSIS — R636 Underweight: Secondary | ICD-10-CM | POA: Diagnosis present

## 2021-07-30 DIAGNOSIS — Z681 Body mass index (BMI) 19 or less, adult: Secondary | ICD-10-CM

## 2021-07-30 DIAGNOSIS — E1110 Type 2 diabetes mellitus with ketoacidosis without coma: Secondary | ICD-10-CM | POA: Diagnosis present

## 2021-07-30 DIAGNOSIS — Z833 Family history of diabetes mellitus: Secondary | ICD-10-CM

## 2021-07-30 DIAGNOSIS — N390 Urinary tract infection, site not specified: Secondary | ICD-10-CM | POA: Diagnosis present

## 2021-07-30 DIAGNOSIS — Z452 Encounter for adjustment and management of vascular access device: Secondary | ICD-10-CM

## 2021-07-30 DIAGNOSIS — G43909 Migraine, unspecified, not intractable, without status migrainosus: Secondary | ICD-10-CM | POA: Diagnosis present

## 2021-07-30 DIAGNOSIS — E876 Hypokalemia: Secondary | ICD-10-CM | POA: Diagnosis present

## 2021-07-30 DIAGNOSIS — Z832 Family history of diseases of the blood and blood-forming organs and certain disorders involving the immune mechanism: Secondary | ICD-10-CM

## 2021-07-30 DIAGNOSIS — Z886 Allergy status to analgesic agent status: Secondary | ICD-10-CM

## 2021-07-30 DIAGNOSIS — R112 Nausea with vomiting, unspecified: Secondary | ICD-10-CM | POA: Diagnosis present

## 2021-07-30 LAB — CBC WITH DIFFERENTIAL/PLATELET
Abs Immature Granulocytes: 0.45 10*3/uL — ABNORMAL HIGH (ref 0.00–0.07)
Basophils Absolute: 0.1 10*3/uL (ref 0.0–0.1)
Basophils Relative: 0 %
Eosinophils Absolute: 0 10*3/uL (ref 0.0–0.5)
Eosinophils Relative: 0 %
HCT: 39.6 % (ref 36.0–46.0)
Hemoglobin: 11.8 g/dL — ABNORMAL LOW (ref 12.0–15.0)
Immature Granulocytes: 1 %
Lymphocytes Relative: 4 %
Lymphs Abs: 1.4 10*3/uL (ref 0.7–4.0)
MCH: 22.1 pg — ABNORMAL LOW (ref 26.0–34.0)
MCHC: 29.8 g/dL — ABNORMAL LOW (ref 30.0–36.0)
MCV: 74.3 fL — ABNORMAL LOW (ref 80.0–100.0)
Monocytes Absolute: 1.5 10*3/uL — ABNORMAL HIGH (ref 0.1–1.0)
Monocytes Relative: 4 %
Neutro Abs: 31.7 10*3/uL — ABNORMAL HIGH (ref 1.7–7.7)
Neutrophils Relative %: 91 %
Platelets: 548 10*3/uL — ABNORMAL HIGH (ref 150–400)
RBC: 5.33 MIL/uL — ABNORMAL HIGH (ref 3.87–5.11)
RDW: 16.4 % — ABNORMAL HIGH (ref 11.5–15.5)
Smear Review: NORMAL
WBC: 35.2 10*3/uL — ABNORMAL HIGH (ref 4.0–10.5)
nRBC: 0 % (ref 0.0–0.2)

## 2021-07-30 LAB — LACTIC ACID, PLASMA
Lactic Acid, Venous: 3.4 mmol/L (ref 0.5–1.9)
Lactic Acid, Venous: 3.4 mmol/L (ref 0.5–1.9)
Lactic Acid, Venous: 1.8 mmol/L (ref 0.5–1.9)

## 2021-07-30 LAB — HCG, QUANTITATIVE, PREGNANCY: hCG, Beta Chain, Quant, S: <1 m[IU]/mL (ref <5)

## 2021-07-30 LAB — URINALYSIS, COMPLETE (UACMP) WITH MICROSCOPIC
Bilirubin Urine: NEGATIVE
Glucose, UA: >=500 mg/dL — AB
Hgb urine dipstick: NEGATIVE
Ketones, ur: 80 mg/dL — AB
Leukocytes,Ua: NEGATIVE
Nitrite: NEGATIVE
Protein, ur: NEGATIVE mg/dL
RBC / HPF: >50 RBC/hpf — ABNORMAL HIGH (ref 0–5)
Specific Gravity, Urine: 1.021 (ref 1.005–1.030)
pH: 5 (ref 5.0–8.0)

## 2021-07-30 LAB — GLUCOSE, CAPILLARY
Glucose-Capillary: 415 mg/dL — ABNORMAL HIGH (ref 70–99)
Glucose-Capillary: 434 mg/dL — ABNORMAL HIGH (ref 70–99)
Glucose-Capillary: 492 mg/dL — ABNORMAL HIGH (ref 70–99)
Glucose-Capillary: 499 mg/dL — ABNORMAL HIGH (ref 70–99)
Glucose-Capillary: 500 mg/dL — ABNORMAL HIGH (ref 70–99)

## 2021-07-30 LAB — COMPREHENSIVE METABOLIC PANEL
ALT: 15 U/L (ref 0–44)
AST: 15 U/L (ref 15–41)
Albumin: 3.8 g/dL (ref 3.5–5.0)
Alkaline Phosphatase: 85 U/L (ref 38–126)
Anion gap: 23 — ABNORMAL HIGH (ref 5–15)
BUN: 38 mg/dL — ABNORMAL HIGH (ref 6–20)
CO2: 16 mmol/L — ABNORMAL LOW (ref 22–32)
Calcium: 9.7 mg/dL (ref 8.9–10.3)
Chloride: 95 mmol/L — ABNORMAL LOW (ref 98–111)
Creatinine, Ser: 0.91 mg/dL (ref 0.44–1.00)
GFR, Estimated: >60 mL/min (ref >60)
Glucose, Bld: 582 mg/dL (ref 70–99)
Potassium: 3.8 mmol/L (ref 3.5–5.1)
Sodium: 134 mmol/L — ABNORMAL LOW (ref 135–145)
Total Bilirubin: 1.1 mg/dL (ref 0.3–1.2)
Total Protein: 8.7 g/dL — ABNORMAL HIGH (ref 6.5–8.1)

## 2021-07-30 LAB — RESP PANEL BY RT-PCR (FLU A&B, COVID) ARPGX2
Influenza A by PCR: NEGATIVE
Influenza B by PCR: NEGATIVE
SARS Coronavirus 2 by RT PCR: NEGATIVE

## 2021-07-30 LAB — BASIC METABOLIC PANEL
Anion gap: 18 — ABNORMAL HIGH (ref 5–15)
BUN: 44 mg/dL — ABNORMAL HIGH (ref 6–20)
CO2: 16 mmol/L — ABNORMAL LOW (ref 22–32)
Calcium: 9.1 mg/dL (ref 8.9–10.3)
Chloride: 101 mmol/L (ref 98–111)
Creatinine, Ser: 0.96 mg/dL (ref 0.44–1.00)
GFR, Estimated: >60 mL/min (ref >60)
Glucose, Bld: 558 mg/dL (ref 70–99)
Potassium: 4.1 mmol/L (ref 3.5–5.1)
Sodium: 135 mmol/L (ref 135–145)

## 2021-07-30 LAB — BLOOD GAS, VENOUS
Acid-base deficit: 10.1 mmol/L — ABNORMAL HIGH (ref 0.0–2.0)
Bicarbonate: 17.6 mmol/L — ABNORMAL LOW (ref 20.0–28.0)
O2 Saturation: 67.4 %
Patient temperature: 37
pCO2, Ven: 45 mmHg (ref 44.0–60.0)
pH, Ven: 7.2 — ABNORMAL LOW (ref 7.250–7.430)
pO2, Ven: 44 mmHg (ref 32.0–45.0)

## 2021-07-30 LAB — BETA-HYDROXYBUTYRIC ACID: Beta-Hydroxybutyric Acid: 6.41 mmol/L — ABNORMAL HIGH (ref 0.05–0.27)

## 2021-07-30 LAB — PROTIME-INR
INR: 1 (ref 0.8–1.2)
Prothrombin Time: 12.9 seconds (ref 11.4–15.2)

## 2021-07-30 LAB — LIPASE, BLOOD: Lipase: 22 U/L (ref 11–51)

## 2021-07-30 LAB — PROCALCITONIN: Procalcitonin: 0.3 ng/mL

## 2021-07-30 LAB — MRSA NEXT GEN BY PCR, NASAL: MRSA by PCR Next Gen: NOT DETECTED

## 2021-07-30 LAB — APTT: aPTT: 28 seconds (ref 24–36)

## 2021-07-30 MED ORDER — SODIUM CHLORIDE 0.9 % IV SOLN
2.0000 g | Freq: Once | INTRAVENOUS | Status: AC
  Administered 2021-07-30: 2 g via INTRAVENOUS
  Filled 2021-07-30: qty 2

## 2021-07-30 MED ORDER — VANCOMYCIN HCL 750 MG/150ML IV SOLN: 750.0000 mg | INTRAVENOUS | Status: DC

## 2021-07-30 MED ORDER — ONDANSETRON HCL 4 MG/2ML IJ SOLN
4.0000 mg | Freq: Four times a day (QID) | INTRAMUSCULAR | Status: DC | PRN
  Administered 2021-07-30 – 2021-07-31 (×2): 4 mg via INTRAVENOUS
  Filled 2021-07-30 (×2): qty 2

## 2021-07-30 MED ORDER — DEXTROSE IN LACTATED RINGERS 5 % IV SOLN: INTRAVENOUS | Status: DC

## 2021-07-30 MED ORDER — INSULIN REGULAR(HUMAN) IN NACL 100-0.9 UT/100ML-% IV SOLN
INTRAVENOUS | Status: DC
  Administered 2021-07-30: 8.5 [IU]/h via INTRAVENOUS
  Filled 2021-07-30: qty 100

## 2021-07-30 MED ORDER — POTASSIUM CHLORIDE 10 MEQ/100ML IV SOLN: 10.0000 meq | INTRAVENOUS | Status: AC

## 2021-07-30 MED ORDER — LACTATED RINGERS IV BOLUS
1000.0000 mL | Freq: Once | INTRAVENOUS | Status: AC
  Administered 2021-07-30: 1000 mL via INTRAVENOUS

## 2021-07-30 MED ORDER — ONDANSETRON HCL 4 MG/2ML IJ SOLN
4.0000 mg | INTRAMUSCULAR | Status: AC
  Administered 2021-07-30: 4 mg via INTRAVENOUS
  Filled 2021-07-30: qty 2

## 2021-07-30 MED ORDER — CHLORHEXIDINE GLUCONATE CLOTH 2 % EX PADS
6.0000 | MEDICATED_PAD | Freq: Every day | CUTANEOUS | Status: DC
  Administered 2021-07-31 – 2021-08-02 (×3): 6 via TOPICAL
  Filled 2021-07-30: qty 6

## 2021-07-30 MED ORDER — LACTATED RINGERS IV SOLN: INTRAVENOUS | Status: DC

## 2021-07-30 MED ORDER — SODIUM CHLORIDE 0.9 % IV BOLUS
1500.0000 mL | Freq: Once | INTRAVENOUS | Status: AC
  Administered 2021-07-30: 1500 mL via INTRAVENOUS

## 2021-07-30 MED ORDER — METRONIDAZOLE 500 MG/100ML IV SOLN
500.0000 mg | Freq: Once | INTRAVENOUS | Status: AC
  Administered 2021-07-30: 500 mg via INTRAVENOUS
  Filled 2021-07-30: qty 100

## 2021-07-30 MED ORDER — VANCOMYCIN HCL IN DEXTROSE 1-5 GM/200ML-% IV SOLN
1000.0000 mg | Freq: Once | INTRAVENOUS | Status: DC
  Filled 2021-07-30 (×2): qty 200

## 2021-07-30 MED ORDER — SODIUM CHLORIDE 0.9 % IV SOLN
2.0000 g | Freq: Two times a day (BID) | INTRAVENOUS | Status: DC
  Administered 2021-07-31: 2 g via INTRAVENOUS
  Filled 2021-07-30 (×4): qty 2

## 2021-07-30 MED ORDER — DEXTROSE 50 % IV SOLN: 0.0000 mL | INTRAVENOUS | Status: DC | PRN

## 2021-07-30 MED ORDER — ENOXAPARIN SODIUM 40 MG/0.4ML IJ SOSY
40.0000 mg | PREFILLED_SYRINGE | INTRAMUSCULAR | Status: DC
  Administered 2021-07-30: 40 mg via SUBCUTANEOUS
  Filled 2021-07-30 (×2): qty 0.4

## 2021-07-30 NOTE — Progress Notes (Signed)
PHARMACY -  BRIEF ANTIBIOTIC NOTE   Pharmacy has received consult(s) for aztreonam 2 grams x 1, vancomycin 1,000 mg x 1 from an ED provider.  The patient's profile has been reviewed for ht/wt/allergies/indication/available labs.    One time order(s) placed for aztreonam 2 grams x 1, vancomycin 1,000 mg x 1   Further antibiotics/pharmacy consults should be ordered by admitting physician if indicated.                       Thank you, Jaynie Bream, PharmD Pharmacy Resident  07/30/2021 3:09 PM

## 2021-07-30 NOTE — Procedures (Signed)
Central Venous Catheter Insertion Procedure Note  JACQUELYN SHADRICK  [id number]  07/11/1990  Date:07/30/21  Time:9:56 PM   Provider Performing:Hampton Wixom A Anna Genre   Procedure: Insertion of Non-tunneled Central Venous [phone]) with US guidance (15400)   Indication(s) Medication administration and Difficult access  Consent Risks of the procedure as well as the alternatives and risks of each were explained to the patient and/or caregiver.  Consent for the procedure was obtained and is signed in the bedside chart  Anesthesia Topical only with 1% lidocaine   Timeout Verified patient identification, verified procedure, site/side was marked, verified correct patient position, special equipment/implants available, medications/allergies/relevant history reviewed, required imaging and test results available.  Sterile Technique Maximal sterile technique including full sterile barrier drape, hand hygiene, sterile gown, sterile gloves, mask, hair covering, sterile ultrasound probe cover (if used).  Procedure Description Area of catheter insertion was cleaned with chlorhexidine and draped in sterile fashion.  With real-time ultrasound guidance a central venous catheter was placed into the right internal jugular vein. Nonpulsatile blood flow and easy flushing noted in all ports.  The catheter was sutured in place and sterile dressing applied.  Complications/Tolerance None; patient tolerated the procedure well. Chest X-ray is ordered to verify placement for internal jugular or subclavian cannulation.   Chest x-ray is not ordered for femoral cannulation.  EBL Minimal  Specimen(s) None   Webb Silversmith, DNP, CCRN, FNP-C, AGACNP-BC Acute Care Nurse Practitioner  [facility] Pager: [phone] [place] [facility]

## 2021-07-30 NOTE — ED Triage Notes (Signed)
BIB EMS from home. N/v x 2 days. 514 BGL. 12lead sinus tach.   HR 130 hypotensive. 22 RR  ETCO2 30.

## 2021-07-30 NOTE — ED Notes (Signed)
IV team unsuccessful.  Admitting provider aware.

## 2021-07-30 NOTE — Consult Note (Signed)
Pharmacy Antibiotic Note  Chelsea Vazquez is a 31 y.o. female admitted on 07/30/2021 with sepsis.  Pharmacy has been consulted for vancomycin and cefepime dosing.  Plan: Vancomycin 1 g IV loading dose, followed by 750 mg IV q24h  Goal AUC 400-550  Est AUC: 500.9 Est Cmax: 36 Est Cmin: 11.3 Calculated with SCr 0.96 and TBW to calculate CrCl & Ke  Cefepime 2 g IV q12h   Monitor clinical picture, renal function, and vancomycin levels at steady state F/U C&S, abx deescalation / LOT   Height: 5' (152.4 cm) Weight: 45.4 kg (100 lb) IBW/kg (Calculated) : 45.5  Temp (24hrs), Avg:98.6 F (37 C), Min:98.6 F (37 C), Max:98.6 F (37 C)  Recent Labs  Lab 07/30/21 1330 07/30/21 1511  WBC 35.2*  --   CREATININE  --  0.91  LATICACIDVEN 3.4*  --     Estimated Creatinine Clearance: 64.2 mL/min (by C-G formula based on SCr of 0.91 mg/dL).    Allergies  Allergen Reactions   Amoxicillin Swelling   Naproxen     Throat swelling/itch   Tylenol [Acetaminophen] Swelling    Antimicrobials this admission: 1/22 aztreonam x 1 in the ED  1/22 vancomycin >>  1/22 cefepime >>   Dose adjustments this admission: N/A  Microbiology results: 1/22 BCx: pending  1/22 UCx: pending  1/22 MRSA PCR: ordered   Thank you for allowing pharmacy to be a part of this patients care.  Derrek Gu, PharmD 07/30/2021 6:29 PM

## 2021-07-30 NOTE — Progress Notes (Signed)
Communicated w/ RN r/t second lactic acid>very difficult stick and efforts continue.

## 2021-07-30 NOTE — ED Notes (Signed)
Multiple attempts at IV access by 2 RNs and MD.  Failed ultrasound IV attempts as well as failed external jugular x 2.

## 2021-07-30 NOTE — ED Provider Notes (Signed)
[facility] Provider Note    Event Date/Time   First MD Initiated Contact with Patient 07/30/21 1319     (approximate)   History   Altered Mental Status    HPI  Chelsea Vazquez is a 31 y.o. female reports sudden nausea and vomiting over the last approximately 2 to 3 days.  Worsening feeling very weak and dehydrated  Denies headache no chest pain no trouble breathing.  Reports nausea and vomiting but no abdominal pain.  Stomach does not hurt.  She is diabetic and takes metformin.  Denies recent sexual activity.  No vaginal discharge or bleeding.       Physical Exam   Triage Vital Signs: ED Triage Vitals  Enc Vitals Group     BP 07/30/21 1319 118/68     Pulse Rate 07/30/21 1319 (!) 130     Resp 07/30/21 1319 (!) 22     Temp 07/30/21 1327 98.6 F (37 C)     Temp Source 07/30/21 1327 Rectal     SpO2 07/30/21 1319 99 %     Weight 07/30/21 1320 100 lb (45.4 kg)     Height 07/30/21 1320 5' (1.524 m)     Head Circumference --      Peak Flow --      Pain Score --      Pain Loc --      Pain Edu? --      Excl. in GC? --     Most recent vital signs: Vitals:   07/30/21 1530 07/30/21 1600  BP: 107/72 105/77  Pulse: (!) 128 (!) 128  Resp: 19 20  Temp:    SpO2: 98% 99%     General: Awake, slightly somnolent, ill-appearing. CV:  Good peripheral perfusion.  Resp:  Normal effort.  Slight tachypnea.  Lung sounds clear bilaterallyAbd:  No distention.  Denies pain to palpation in any quadrant.  Negative Murphy.  No suprapubic pain.  No pain McBurney's point Other:  Skin appears pale, capillary refill slightly slow in the peripheral extremities bilaterally.   ED Results / Procedures / Treatments   Labs (all labs ordered are listed, but only abnormal results are displayed) Labs Reviewed  BLOOD GAS, VENOUS - Abnormal; Notable for the following components:      Result Value   pH, Ven 7.20 (*)    Bicarbonate 17.6 (*)    Acid-base deficit  10.1 (*)    All other components within normal limits  LACTIC ACID, PLASMA - Abnormal; Notable for the following components:   Lactic Acid, Venous 3.4 (*)    All other components within normal limits  CBC WITH DIFFERENTIAL/PLATELET - Abnormal; Notable for the following components:   WBC 35.2 (*)    RBC 5.33 (*)    Hemoglobin 11.8 (*)    MCV 74.3 (*)    MCH 22.1 (*)    MCHC 29.8 (*)    RDW 16.4 (*)    Platelets 548 (*)    Neutro Abs 31.7 (*)    Monocytes Absolute 1.5 (*)    Abs Immature Granulocytes 0.45 (*)    All other components within normal limits  BETA-HYDROXYBUTYRIC ACID - Abnormal; Notable for the following components:   Beta-Hydroxybutyric Acid 6.41 (*)    All other components within normal limits  COMPREHENSIVE METABOLIC PANEL - Abnormal; Notable for the following components:   Sodium 134 (*)    Chloride 95 (*)    CO2 16 (*)    Glucose, Bld  582 (*)    BUN 38 (*)    Total Protein 8.7 (*)    Anion gap 23 (*)    All other components within normal limits  CULTURE, BLOOD (ROUTINE X 2)  CULTURE, BLOOD (ROUTINE X 2)  URINE CULTURE  PROTIME-INR  APTT  HCG, QUANTITATIVE, PREGNANCY  LACTIC ACID, PLASMA  URINALYSIS, COMPLETE (UACMP) WITH MICROSCOPIC  HIV ANTIBODY (ROUTINE TESTING W REFLEX)  POC URINE PREG, ED  POC URINE PREG, ED  CBG MONITORING, ED  POC URINE PREG, ED  CBG MONITORING, ED        Chest x-ray personally viewed by me interpreted as negative for acute finding    PROCEDURES:  Critical Care performed: yes  Procedures   MEDICATIONS ORDERED IN ED: Medications  lactated ringers infusion (has no administration in time range)  metroNIDAZOLE (FLAGYL) IVPB 500 mg (has no administration in time range)  vancomycin (VANCOCIN) IVPB 1000 mg/200 mL premix (has no administration in time range)  insulin regular, human (MYXREDLIN) 100 units/ 100 mL infusion (has no administration in time range)  lactated ringers infusion (has no administration in time range)   dextrose 5 % in lactated ringers infusion (has no administration in time range)  dextrose 50 % solution 0-50 mL (has no administration in time range)  potassium chloride 10 mEq in 100 mL IVPB (has no administration in time range)  enoxaparin (LOVENOX) injection 40 mg (has no administration in time range)  sodium chloride 0.9 % bolus 1,500 mL (1,500 mLs Intravenous New Bag/Given 07/30/21 1420)  aztreonam (AZACTAM) 2 g in sodium chloride 0.9 % 100 mL IVPB (2 g Intravenous New Bag/Given 07/30/21 1542)  ondansetron (ZOFRAN) injection 4 mg (4 mg Intravenous Given 07/30/21 1544)     IMPRESSION / MDM / ASSESSMENT AND PLAN / ED COURSE  I reviewed the triage vital signs and the nursing notes.                              Differential diagnosis includes, but is not limited to, fatigue DKA, acute metabolic or toxic abnormality, infection, sepsis  Personally reviewed the patient's imaging results, personally reviewed and interpreted the patient's laboratory results which show elevated beta hydroxy, elevated glucose, low CO2 low pH consistent with likely DKA in this setting.  Also severe leukocytosis concerning for risk of infection but not yet proven.  Rule out sepsis but given the presentation and EMS report of severe initial hypotension started on broad-spectrum antibiotic empirically as we continue to rule out possible sepsis   Clinical Course as of 07/30/21 1654  Sun Jul 30, 2021  1420 Angiocath insertion Performed by: Sharyn CreamerMark Devynn Hessler  Consent: Verbal consent obtained. Risks and benefits: risks, benefits and alternatives were discussed Time out: Immediately prior to procedure a "time out" was called to verify the correct patient, procedure, equipment, support staff and site/side marked as required.  Preparation: Patient was prepped and draped in the usual sterile fashion.  Vein Location: right antecubital  Ultrasound Guided  Gauge: 20  Normal blood return and flush without difficulty Patient  tolerance: Patient tolerated the procedure well with no immediate complications.    [MQ]    Clinical Course User Index [MQ] Sharyn CreamerQuale, Sylvan Lahm, MD   Admission accepted by Dr. Thomes DinningAdefeso  Patient status improving steadily throughout ED visit.  Now fully alert and oriented reports no ongoing pain, nausea controlled feels and appears much improved.  Awaiting admission to hospitalist service.  DKA protocol and  IV insulin ordered  FINAL CLINICAL IMPRESSION(S) / ED DIAGNOSES   Final diagnoses:  Diabetic ketoacidosis without coma associated with type 2 diabetes mellitus (HCC)  Nausea and vomiting, unspecified vomiting type     Rx / DC Orders   ED Discharge Orders     None        Note:  This document was prepared using Dragon voice recognition software and may include unintentional dictation errors.   Sharyn Creamer, MD 07/30/21 [phone]

## 2021-07-30 NOTE — Progress Notes (Signed)
Elink following for sepsis protocol. 

## 2021-07-30 NOTE — Progress Notes (Signed)
Serum glucose 558, provider at bedside , NP is aware

## 2021-07-30 NOTE — ED Notes (Signed)
Sister, Irish Lack, [phone] Thomes Dinning [phone]

## 2021-07-30 NOTE — H&P (Signed)
History and Physical  Chelsea Vazquez:[id number] DOB: Nov 06, 1990 DOA: 07/30/2021  Referring physician: Delman Kitten, MD  PCP: [facility] [place]  Patient coming from: Home  Chief Complaint: Nausea and vomiting  HPI: Chelsea Vazquez is a 31 y.o. female with medical history significant for T2DM, depression, anxiety who presents to the emergency department due to 2-day onset of sudden onset of nausea and nonbloody vomiting and she has been unable to tolerate any oral intake.  This was associated with generalized weakness and dehydration which resulted to activating EMS and she was taken to the ED for further evaluation and management.  She denies fever, chills, shortness of breath, headache, abdominal pain, diarrhea or constipation.  ED Course:  In the emergency department, she was tachypneic and tachycardic, but other vital signs were within normal range.  Work-up in the ED showed leukocytosis, microcytic anemia, thrombocytosis, mild hyponatremia, hyperglycemia (CBG 582), bicarb 16 and anion gap of 23.  VBG showed pH of 7.2 and bicarb of 17.6.  Beta hydroxybutyrate was 6.1. Chest x-ray showed no active disease  Review of Systems: A full 10 point Review of Systems was done, except as stated above, all other Review of systems were negative.  Past Medical History:  Diagnosis Date   Depression    GDM (gestational diabetes mellitus)    fourth pregnancy   History of chlamydia 2011   History of IUFD    D/t Abruption HELLP   Migraines    Premature labor 11/29/2011   35 weeks   Pyelonephritis 2011   Past Surgical History:  Procedure Laterality Date   tubal ligation  10/10/2014   [facility]    Social History:  reports that she has never smoked. She has never used smokeless tobacco. She reports that she does not drink alcohol and does not use drugs.   Allergies  Allergen Reactions   Amoxicillin Swelling   Naproxen     Throat swelling/itch   Tylenol  [Acetaminophen] Swelling    Family History  Problem Relation Age of Onset   Hypertension Mother    Hypertension Father    Diabetes Paternal Grandmother        Type 1   Sickle cell trait Other      Prior to Admission medications   Medication Sig Start Date End Date Taking? Authorizing Provider  ALPRAZolam (XANAX) 0.25 MG tablet Take 0.25 mg by mouth 2 (two) times daily as needed for anxiety. 08/11/19   [provider]  dicyclomine (BENTYL) 10 MG capsule Take 1 capsule (10 mg total) by mouth 3 (three) times daily as needed for up to 5 days (abdominal pain/cramping). 11/30/20 12/05/20  Arta Silence, MD  gabapentin (NEURONTIN) 300 MG capsule Take 1 capsule (300 mg total) by mouth at bedtime. 08/14/19 09/13/19  Blake Divine, MD  HYDROcodone-acetaminophen (NORCO/VICODIN) 5-325 MG tablet Take 1 tablet by mouth every 6 (six) hours as needed for moderate pain. 02/22/19   Johnn Hai, PA-C  ibuprofen (ADVIL) 800 MG tablet Take 800 mg by mouth every 8 (eight) hours as needed. 04/29/12   [provider]  lactulose (CHRONULAC) 10 GM/15ML solution Take 30 mLs (20 g total) by mouth daily as needed for mild constipation. 10/13/19   Paulette Blanch, MD  LATUDA 60 MG TABS Take 60 mg by mouth daily. 06/24/19   [provider]  metFORMIN (GLUCOPHAGE) 850 MG tablet Take 1 tablet (850 mg total) by mouth 2 (two) times daily with a meal. 08/04/19 08/03/20  Quentin Cornwall,  Saralyn Pilar, MD  methocarbamol (ROBAXIN) 500 MG tablet Take 1 tablet (500 mg total) by mouth 4 (four) times daily. 05/08/20   Cuthriell, Charline Bills, PA-C  ondansetron (ZOFRAN ODT) 4 MG disintegrating tablet Take 1 tablet (4 mg total) by mouth every 8 (eight) hours as needed for nausea or vomiting. 11/30/20   Vladimir Crofts, MD  predniSONE (DELTASONE) 50 MG tablet Take 1 tablet (50 mg total) by mouth daily with breakfast. 05/08/20   Cuthriell, Charline Bills, PA-C  QUEtiapine (SEROQUEL) 200 MG tablet Take 200 mg by mouth at bedtime.  06/22/19   [provider]  promethazine (PHENERGAN) 25 MG tablet Take 1 tablet (25 mg total) by mouth every 6 (six) hours as needed for nausea or vomiting. 06/02/16 02/22/19  Sable Feil, PA-C    Physical Exam: BP 105/77    Pulse (!) 128    Temp 98.6 F (37 C) (Rectal)    Resp 20    Ht 5' (1.524 m)    Wt 45.4 kg    SpO2 99%    BMI 19.53 kg/m   General: 31 y.o. year-old female, cachectic, ill-appearing but in no acute distress.  Alert and oriented x3. HEENT: Dry mucous membrane.  NCAT, EOMI Neck: Supple, trachea medial Cardiovascular: Tachycardia.  Regular rate and rhythm with no rubs or gallops.  No thyromegaly or JVD noted.  No lower extremity edema. 2/4 pulses in all 4 extremities. Respiratory: Tachypnea.  Clear to auscultation with no wheezes or rales.  Abdomen: Soft, nontender nondistended with normal bowel sounds x4 quadrants. Muskuloskeletal: No cyanosis, clubbing or edema noted bilaterally Neuro: CN II-XII intact, sensation, reflexes intact Skin: No ulcerative lesions noted or rashes Psychiatry: Judgement and insight appear normal. Mood is appropriate for condition and setting          Labs on Admission:  Basic Metabolic Panel: Recent Labs  Lab 07/30/21 1511  NA 134*  K 3.8  CL 95*  CO2 16*  GLUCOSE 582*  BUN 38*  CREATININE 0.91  CALCIUM 9.7   Liver Function Tests: Recent Labs  Lab 07/30/21 1511  AST 15  ALT 15  ALKPHOS 85  BILITOT 1.1  PROT 8.7*  ALBUMIN 3.8   No results for input(s): LIPASE, AMYLASE in the last 168 hours. No results for input(s): AMMONIA in the last 168 hours. CBC: Recent Labs  Lab 07/30/21 1330  WBC 35.2*  NEUTROABS 31.7*  HGB 11.8*  HCT 39.6  MCV 74.3*  PLT 548*   Cardiac Enzymes: No results for input(s): CKTOTAL, CKMB, CKMBINDEX, TROPONINI in the last 168 hours.  BNP (last 3 results) No results for input(s): BNP in the last 8760 hours.  ProBNP (last 3 results) No results for input(s): PROBNP in the last 8760  hours.  CBG: No results for input(s): GLUCAP in the last 168 hours.  Radiological Exams on Admission: DG Chest Port 1 View  Result Date: 07/30/2021 CLINICAL DATA:  Questionable sepsis. Altered mental status. Nausea and vomiting for 2 days. EXAM: PORTABLE CHEST 1 VIEW COMPARISON:  10/12/2019 FINDINGS: The heart size and mediastinal contours are within normal limits. Both lungs are clear. The visualized skeletal structures are unremarkable. IMPRESSION: No active disease. Electronically Signed   By: Kerby Moors M.D.   On: 07/30/2021 14:02    EKG: I independently viewed the EKG done and my findings are as followed: EKG was not done in the ED  Assessment/Plan Present on Admission:  DKA (diabetic ketoacidosis) ([place])  Principal Problem:   DKA (diabetic ketoacidosis) (  [place]) Active Problems:   Hyperglycemia due to diabetes mellitus (H[facility])   Leukocytosis   Thrombocytosis   Hyponatremia   Microcytic anemia   Dehydration   SIRS (systemic inflammatory response syndrome) ([facility])  Anion gap metabolic acidosis secondary to DKA Hyperglycemia secondary to poorly controlled type 2 diabetes mellitus Beta hydroxybutyric acid 6.41 Continue insulin drip, IV LR with IV potassium per DKA protocol Transition IV LR to D5 LR when serum glucose reaches 279m/dL Continue serial BMP and VBG Continue to monitor for anion gap closure prior to transitioning patient to subcu insulin Continue NPO  Generalized weakness, nausea and vomiting Continue Zofran as needed Continue IV hydration Continue fall precaution and neurochecks.  Lactic acidosis secondary to multifactorial Lactic acid 3.4, continue IV hydration and continue to trend lactic acid  Leukocytosis possibly reactive versus infectious SIRS Patient met SIRS criteria  due to tachypnea, tachycardia and leukocytosis, but there was no obvious source of infection She was empirically started on IV aztreonam, Flagyl and vancomycin.  We shall empirically  continue with vancomycin and Cefepime (to be dosed by pharmacy) with plan to de-escalate/discontinue based on blood culture/procalcitonin.  Chronic thrombocytosis  Platelets 548, continue to monitor platelet level with morning labs  Hyponatremia induced by hyperglycemia Na 134; corrected sodium level based on CBG (582) is 142  Dehydration Continue IV hydration  Microcytic anemia MCV 74.3, H/H11.8/39.6 Iron studies will be checked   DVT prophylaxis: Lovenox  Code Status: Full code  Family Communication: None at bedside  Disposition Plan:  Patient is from:                        home Anticipated DC to:                   SNF or family members home Anticipated DC date:               2-3 days Anticipated DC barriers:          Patient requires inpatient management due to DKA requiring IV insulin drip    Consults called: None  Admission status: Observation    OBernadette HoitMD [facility]  07/30/2021, 6:13 PM

## 2021-07-30 NOTE — ED Notes (Signed)
IV infiltrated.  IV team at bedside to attempt to obtain 2 IV's for continued sepsis medications/fluids and insulin.

## 2021-07-30 NOTE — Consult Note (Signed)
NAME:  Chelsea Vazquez, MRN:  [id number], DOB:  August 19, 1990, LOS: 0 ADMISSION DATE:  07/30/2021, CONSULTATION DATE:  07/30/2021 REFERRING MD: Nicanor Bake MD CHIEF COMPLAINT:  Generalized Weakness    HPI  31 y.o with significant PMH of T2DM, Depression and Anxiety, IUFD, Pyelonephritis and Migraines who presented to the ED with chief complaints of 2-day onset of sudden onset of nausea and nonbloody vomiting, poor po intake associated with generalized weakness and dehydration.   ED Course: On arrival to the ED, She was afebrile with blood pressure 118/68 mm Hg and pulse rate  130 beats/min, RR 22 breaths per minute, sats 99% on RA. There were no focal neurological deficits;she was lethargic with kussmaul pattern breathing.  Pertinent lab values showed WBC 35.2, hematocrit 39.6%, hemoglobin 11.8 g/dl, PLTS 548, Glucose 582 mg/dl, urea 38 mg/dL, creatinine 0.91 mg/dL, Na+ 134 mmol/L K+ 3.8 mmol/L), and Cl? 95 mmol/L. Venous Blood Gas showed: pH was 7.20, PO2 44 mmHg, PCO2 45 mmHg, HCO 17 mmol/L, and Oxygen sat 67.4%. UA positive for ketone bodies  the concentration of ?-OHB in serum was 6.41 mmol/L. Urinalysis showed glucose >500 mg/dl and specific gravity 1.02. Lactic acid 3.4. An insidious infectious workup was initiated, including rapid HIV, Respiratory panel Covid, Influenza A&B that were ultimately negative. ECG showed sinus tachycardia of 130 beats/min.  The laboratory data showed an anion gap, metabolic acidosis, and hyperglycemia consistent with the diagnosis of DKA. UA with evidence of UTI however chest xray demonstrated no evidence of infection. The patient's hemoglobin A1c (A1C) pending. The management was initiated with initial intravenous fluid, electrolytes replacement, and intravenous insulin infusion as per DKA protocol. Serum electrolytes were closely monitored. Due to concerns of underlying infectious process, patient was also started on broad spectrum antibiotics. PCCM consulted for  central line placement and assist with DKA management.   Past Medical History   GDM (Gestational Diabetes Mellitus)   History of IUFD    Depression    Premature labor 11/29/2011  History of chlamydia 07/09/2009  Pyelonephritis 07/09/2009   Significant Hospital Events   07/30/21: Admitted to stepdown unit with DKA.  PCCM consulted for central line placement  Consults:  PCCM  Procedures:  1/22: Right IJ central line placement  Significant Diagnostic Tests:  1/22: Chest Xray> no active cardiopulmonary process  Micro Data:  1/22: SARS-CoV-2 PCR> negative 1/22: Influenza PCR> negative 1/22: Blood culture x2> 1/22: Urine Culture> 1/22: MRSA PCR>>   Antimicrobials:  Vancomycin 1/22 x1 Arztreonam 1/22 x 1 Metronidazole 1/22 x 1 Ceftriaxone 1/22 >  OBJECTIVE  Blood pressure 100/77, pulse (!) 131, temperature 98.3 F (36.8 C), temperature source Oral, resp. rate 19, height 5' (1.524 m), weight 41.3 kg, SpO2 100 %.       No intake or output data in the 24 hours ending 07/30/21 2159 Filed Weights   07/30/21 1320 07/30/21 2013  Weight: 45.4 kg 41.3 kg   Physical Examination  GENERAL: 31 year-old critically ill patient lying in the bed with no acute distress.  EYES: Pupils equal, round, reactive to light and accommodation. No scleral icterus. Extraocular muscles intact.  HEENT: Head atraumatic, normocephalic. Oropharynx and nasopharynx clear.  NECK:  Supple, no jugular venous distention. No thyroid enlargement, no tenderness.  LUNGS: Normal breath sounds bilaterally, no wheezing, rales,rhonchi or crepitation. No use of accessory muscles of respiration.  CARDIOVASCULAR: S1, S2 normal. No murmurs, rubs, or gallops.  ABDOMEN: Soft, nontender, nondistended. Bowel sounds present. No organomegaly or mass.  EXTREMITIES: No pedal edema,  cyanosis, or clubbing.  NEUROLOGIC: Cranial nerves II through XII are intact.  Muscle strength 5/5 in all extremities. Sensation intact. Gait not  checked.  PSYCHIATRIC: The patient is alert and oriented x 2.  SKIN: No obvious rash, lesion, or ulcer.   Labs/imaging that I havepersonally reviewed  (right click and "Reselect all SmartList Selections" daily)     Labs   CBC: Recent Labs  Lab 07/30/21 1330  WBC 35.2*  NEUTROABS 31.7*  HGB 11.8*  HCT 39.6  MCV 74.3*  PLT 548*    Basic Metabolic Panel: Recent Labs  Lab 07/30/21 1511 07/30/21 2031  NA 134* 135  K 3.8 4.1  CL 95* 101  CO2 16* 16*  GLUCOSE 582* 558*  BUN 38* 44*  CREATININE 0.91 0.96  CALCIUM 9.7 9.1   GFR: Estimated Creatinine Clearance: 55.4 mL/min (by C-G formula based on SCr of 0.96 mg/dL). Recent Labs  Lab 07/30/21 1330 07/30/21 2031  PROCALCITON  --  0.30  WBC 35.2*  --   LATICACIDVEN 3.4* 3.4*    Liver Function Tests: Recent Labs  Lab 07/30/21 1511  AST 15  ALT 15  ALKPHOS 85  BILITOT 1.1  PROT 8.7*  ALBUMIN 3.8   Recent Labs  Lab 07/30/21 2031  LIPASE 22   No results for input(s): AMMONIA in the last 168 hours.  ABG    Component Value Date/Time   HCO3 17.6 (L) 07/30/2021 1330   ACIDBASEDEF 10.1 (H) 07/30/2021 1330   O2SAT 67.4 07/30/2021 1330     Coagulation Profile: Recent Labs  Lab 07/30/21 1511  INR 1.0    Cardiac Enzymes: No results for input(s): CKTOTAL, CKMB, CKMBINDEX, TROPONINI in the last 168 hours.  HbA1C: Hemoglobin A1C  Date/Time Value Ref Range Status  02/20/2013 03:41 PM 5.8 4.2 - 6.3 % Final    Comment:    The American Diabetes Association recommends that a primary goal of therapy should be <7% and that physicians should reevaluate the treatment regimen in patients with HbA1c values consistently >8%.     CBG: Recent Labs  Lab 07/30/21 2009 07/30/21 2146  GLUCAP 492* 499*    Review of Systems:   PATIENT UNABLE TO TO PROVIDE ROS DUE TO CRITICAL ILLNESS  Past Medical History  She,  has a past medical history of Depression, GDM (gestational diabetes mellitus), History of chlamydia  (2011), History of IUFD, Migraines, Premature labor (11/29/2011), and Pyelonephritis (2011).   Surgical History    Past Surgical History:  Procedure Laterality Date   tubal ligation  10/10/2014   [facility]     Social History   reports that she has never smoked. She has never used smokeless tobacco. She reports that she does not drink alcohol and does not use drugs.   Family History   Her family history includes Diabetes in her paternal grandmother; Hypertension in her father and mother; Sickle cell trait in an other family member.   Allergies Allergies  Allergen Reactions   Amoxicillin Swelling   Naproxen     Throat swelling/itch   Tylenol [Acetaminophen] Swelling     Home Medications  Prior to Admission medications   Medication Sig Start Date End Date Taking? Authorizing Provider  ALPRAZolam (XANAX) 0.25 MG tablet Take 0.25 mg by mouth 2 (two) times daily as needed for anxiety. 08/11/19   [provider]  dicyclomine (BENTYL) 10 MG capsule Take 1 capsule (10 mg total) by mouth 3 (three) times daily as needed for up to 5 days (abdominal pain/cramping).  11/30/20 12/05/20  Arta Silence, MD  gabapentin (NEURONTIN) 300 MG capsule Take 1 capsule (300 mg total) by mouth at bedtime. 08/14/19 09/13/19  Blake Divine, MD  HYDROcodone-acetaminophen (NORCO/VICODIN) 5-325 MG tablet Take 1 tablet by mouth every 6 (six) hours as needed for moderate pain. 02/22/19   Johnn Hai, PA-C  ibuprofen (ADVIL) 800 MG tablet Take 800 mg by mouth every 8 (eight) hours as needed. 04/29/12   [provider]  lactulose (CHRONULAC) 10 GM/15ML solution Take 30 mLs (20 g total) by mouth daily as needed for mild constipation. 10/13/19   Paulette Blanch, MD  LATUDA 60 MG TABS Take 60 mg by mouth daily. 06/24/19   [provider]  metFORMIN (GLUCOPHAGE) 850 MG tablet Take 1 tablet (850 mg total) by mouth 2 (two) times daily with a meal. 08/04/19 08/03/20  Merlyn Lot, MD   methocarbamol (ROBAXIN) 500 MG tablet Take 1 tablet (500 mg total) by mouth 4 (four) times daily. 05/08/20   Cuthriell, Charline Bills, PA-C  ondansetron (ZOFRAN ODT) 4 MG disintegrating tablet Take 1 tablet (4 mg total) by mouth every 8 (eight) hours as needed for nausea or vomiting. 11/30/20   Vladimir Crofts, MD  predniSONE (DELTASONE) 50 MG tablet Take 1 tablet (50 mg total) by mouth daily with breakfast. 05/08/20   Cuthriell, Charline Bills, PA-C  QUEtiapine (SEROQUEL) 200 MG tablet Take 200 mg by mouth at bedtime. 06/22/19   [provider]  promethazine (PHENERGAN) 25 MG tablet Take 1 tablet (25 mg total) by mouth every 6 (six) hours as needed for nausea or vomiting. 06/02/16 02/22/19  Sable Feil, PA-C    Scheduled Meds:  Derrill Memo ON 07/31/2021] Chlorhexidine Gluconate Cloth  6 each Topical Q0600   enoxaparin (LOVENOX) injection  40 mg Subcutaneous Q24H   Continuous Infusions:  dextrose 5% lactated ringers     insulin     lactated ringers     lactated ringers     lactated ringers     vancomycin     PRN Meds:.dextrose, ondansetron (ZOFRAN) IV   Active Hospital Problem list   DKA due to diabetes mellitus Sepsis secondary to suspected UTI AKI Hyponatremia Thrombocytosis Microcytic anemia  Assessment & Plan:  Diabetic Ketoacidosis  in the setting of suspected UTI? Anion Gap Metabolic Acidosis PMHx: DM type 2 Home meds: Metformin -Trigger Assessment (CXR, UA negative, Blood  and urine Cultures for infection pending) -Troponin elevated, EKG  shows no ischemia -Will check Lipase for pancreatitis -Received Insulin (regular) 0.1u/kg (~10 units) IV x1. Continue Insulin drip, DKA protocol -Keep NPO -Glucose: q1h to titrate insulin -Lab monitoring: q2-4h BMP+Phosphorus+pH (ABG/VBG) to assess severity of acidemia -Aggressive  volume repletion  -Goal to normalize anion gap  -When normalize anion gap and low stable insulin requirement for 4 hours will start  long acting insulin  (NPH) then stop drip 2-3 hours AFTER to bridge effect and start carb-controlled diet. -Diabetes coordinator consult  Sepsis without septic shock due to suspected UTI Lactic: 3.4, Baseline PCT: 0.3, UA: +, CXR: - Initial interventions/workup included: 4 L of LR & Aztreonam/ Vancomycin/ Metronidazole -Supplemental oxygen as needed, to maintain SpO2 > 90% -/u cultures, trend lactic/ PCT -Monitor WBC/ fever curve -Will start on Ceftriaxone pending cultures -IVF hydration as needed -Pressors for MAP goal >65 -Strict I/O's   Mild Acute Kidney Injury (Creatine up from 0.37 to 0.91) Hyponatremia- likely pseudohyponatremia in the setting of DKA correct for hyperglycemia -Monitor I&O's / urinary output -Follow BMP -Ensure adequate  renal perfusion -Avoid nephrotoxic agents as able -Replace electrolytes as indicated  Microcytic Anemia -Check Iron panel  Anxiety and Depression -We will continue home quetiapine, Latuda, and alprazolam once able to take p.o.  Best practice:  Diet:  NPO Pain/Anxiety/Delirium protocol (if indicated): No VAP protocol (if indicated): Not indicated DVT prophylaxis: LMWH GI prophylaxis: PPI Glucose control:  Insulin gtt Central venous access:  Yes, and it is still needed Arterial line:  N/A Foley:  Yes, and it is still needed Mobility:  bed rest  PT consulted: N/A Last date of multidisciplinary goals of care discussion [1/22] Code Status:  full code Disposition: Stepdown   = Goals of Care = Code Status Order: FULL  Primary Emergency Contact: Alston,Doma L Wishes to pursue full aggressive treatment and intervention options, including CPR and intubation, but goals of care will be addressed on going with family if that should become necessary.  Critical care time: 45 minutes     Rufina Falco, DNP, CCRN, FNP-C, AGACNP-BC Acute Care Nurse Practitioner  [facility] Pager: [phone] [place] at [facility]  .

## 2021-07-30 NOTE — Progress Notes (Signed)
CODE SEPSIS - PHARMACY COMMUNICATION  **Broad Spectrum Antibiotics should be administered within 1 hour of Sepsis diagnosis**  Time Code Sepsis Called/Page Received: 1502  Antibiotics Ordered: aztreonam, vancomycin  Time of 1st antibiotic administration: 1542  Additional action taken by pharmacy: None  If necessary, Name of Provider/Nurse Contacted: N/a    Jaynie Bream, PharmD Pharmacy Resident  07/30/2021 3:10 PM

## 2021-07-31 ENCOUNTER — Encounter: Payer: Self-pay | Admitting: Internal Medicine

## 2021-07-31 DIAGNOSIS — Z833 Family history of diabetes mellitus: Secondary | ICD-10-CM | POA: Diagnosis not present

## 2021-07-31 DIAGNOSIS — E1110 Type 2 diabetes mellitus with ketoacidosis without coma: Secondary | ICD-10-CM

## 2021-07-31 DIAGNOSIS — E876 Hypokalemia: Secondary | ICD-10-CM | POA: Diagnosis present

## 2021-07-31 DIAGNOSIS — N390 Urinary tract infection, site not specified: Secondary | ICD-10-CM | POA: Diagnosis present

## 2021-07-31 DIAGNOSIS — F419 Anxiety disorder, unspecified: Secondary | ICD-10-CM | POA: Diagnosis present

## 2021-07-31 DIAGNOSIS — F32A Depression, unspecified: Secondary | ICD-10-CM | POA: Diagnosis present

## 2021-07-31 DIAGNOSIS — R636 Underweight: Secondary | ICD-10-CM | POA: Diagnosis present

## 2021-07-31 DIAGNOSIS — Z886 Allergy status to analgesic agent status: Secondary | ICD-10-CM | POA: Diagnosis not present

## 2021-07-31 DIAGNOSIS — E860 Dehydration: Secondary | ICD-10-CM | POA: Diagnosis present

## 2021-07-31 DIAGNOSIS — Z20822 Contact with and (suspected) exposure to covid-19: Secondary | ICD-10-CM | POA: Diagnosis present

## 2021-07-31 DIAGNOSIS — R112 Nausea with vomiting, unspecified: Secondary | ICD-10-CM | POA: Diagnosis present

## 2021-07-31 DIAGNOSIS — Z880 Allergy status to penicillin: Secondary | ICD-10-CM | POA: Diagnosis not present

## 2021-07-31 DIAGNOSIS — Z832 Family history of diseases of the blood and blood-forming organs and certain disorders involving the immune mechanism: Secondary | ICD-10-CM | POA: Diagnosis not present

## 2021-07-31 DIAGNOSIS — E871 Hypo-osmolality and hyponatremia: Secondary | ICD-10-CM | POA: Diagnosis present

## 2021-07-31 DIAGNOSIS — D509 Iron deficiency anemia, unspecified: Secondary | ICD-10-CM | POA: Diagnosis present

## 2021-07-31 DIAGNOSIS — G43909 Migraine, unspecified, not intractable, without status migrainosus: Secondary | ICD-10-CM | POA: Diagnosis present

## 2021-07-31 DIAGNOSIS — Z79899 Other long term (current) drug therapy: Secondary | ICD-10-CM | POA: Diagnosis not present

## 2021-07-31 DIAGNOSIS — R6520 Severe sepsis without septic shock: Secondary | ICD-10-CM | POA: Diagnosis present

## 2021-07-31 DIAGNOSIS — Z888 Allergy status to other drugs, medicaments and biological substances status: Secondary | ICD-10-CM | POA: Diagnosis not present

## 2021-07-31 DIAGNOSIS — D75839 Thrombocytosis, unspecified: Secondary | ICD-10-CM | POA: Diagnosis present

## 2021-07-31 DIAGNOSIS — Z681 Body mass index (BMI) 19 or less, adult: Secondary | ICD-10-CM | POA: Diagnosis not present

## 2021-07-31 DIAGNOSIS — Z8249 Family history of ischemic heart disease and other diseases of the circulatory system: Secondary | ICD-10-CM | POA: Diagnosis not present

## 2021-07-31 DIAGNOSIS — A419 Sepsis, unspecified organism: Secondary | ICD-10-CM | POA: Diagnosis present

## 2021-07-31 LAB — COMPREHENSIVE METABOLIC PANEL
ALT: 9 U/L (ref 0–44)
AST: 12 U/L — ABNORMAL LOW (ref 15–41)
Albumin: 2.3 g/dL — ABNORMAL LOW (ref 3.5–5.0)
Alkaline Phosphatase: 49 U/L (ref 38–126)
Anion gap: 3 — ABNORMAL LOW (ref 5–15)
BUN: 30 mg/dL — ABNORMAL HIGH (ref 6–20)
CO2: 23 mmol/L (ref 22–32)
Calcium: 7.7 mg/dL — ABNORMAL LOW (ref 8.9–10.3)
Chloride: 115 mmol/L — ABNORMAL HIGH (ref 98–111)
Creatinine, Ser: 0.44 mg/dL (ref 0.44–1.00)
GFR, Estimated: >60 mL/min (ref >60)
Glucose, Bld: 239 mg/dL — ABNORMAL HIGH (ref 70–99)
Potassium: 3.2 mmol/L — ABNORMAL LOW (ref 3.5–5.1)
Sodium: 141 mmol/L (ref 135–145)
Total Bilirubin: 0.3 mg/dL (ref 0.3–1.2)
Total Protein: 5.3 g/dL — ABNORMAL LOW (ref 6.5–8.1)

## 2021-07-31 LAB — GLUCOSE, CAPILLARY
Glucose-Capillary: 125 mg/dL — ABNORMAL HIGH (ref 70–99)
Glucose-Capillary: 133 mg/dL — ABNORMAL HIGH (ref 70–99)
Glucose-Capillary: 137 mg/dL — ABNORMAL HIGH (ref 70–99)
Glucose-Capillary: 146 mg/dL — ABNORMAL HIGH (ref 70–99)
Glucose-Capillary: 164 mg/dL — ABNORMAL HIGH (ref 70–99)
Glucose-Capillary: 167 mg/dL — ABNORMAL HIGH (ref 70–99)
Glucose-Capillary: 196 mg/dL — ABNORMAL HIGH (ref 70–99)
Glucose-Capillary: 210 mg/dL — ABNORMAL HIGH (ref 70–99)
Glucose-Capillary: 222 mg/dL — ABNORMAL HIGH (ref 70–99)
Glucose-Capillary: 235 mg/dL — ABNORMAL HIGH (ref 70–99)
Glucose-Capillary: 258 mg/dL — ABNORMAL HIGH (ref 70–99)
Glucose-Capillary: 312 mg/dL — ABNORMAL HIGH (ref 70–99)

## 2021-07-31 LAB — IRON AND TIBC
Iron: 14 ug/dL — ABNORMAL LOW (ref 28–170)
Saturation Ratios: 5 % — ABNORMAL LOW (ref 10.4–31.8)
TIBC: 284 ug/dL (ref 250–450)
UIBC: 270 ug/dL

## 2021-07-31 LAB — FERRITIN: Ferritin: 13 ng/mL (ref 11–307)

## 2021-07-31 LAB — PHOSPHORUS: Phosphorus: 1.6 mg/dL — ABNORMAL LOW (ref 2.5–4.6)

## 2021-07-31 LAB — BLOOD GAS, VENOUS
Acid-base deficit: 2.9 mmol/L — ABNORMAL HIGH (ref 0.0–2.0)
Acid-base deficit: 0.8 mmol/L (ref 0.0–2.0)
Bicarbonate: 22.7 mmol/L (ref 20.0–28.0)
Bicarbonate: 24.3 mmol/L (ref 20.0–28.0)
O2 Saturation: 81.6 %
O2 Saturation: 84.2 %
Patient temperature: 37
Patient temperature: 37
pCO2, Ven: 42 mmHg — ABNORMAL LOW (ref 44.0–60.0)
pCO2, Ven: 41 mmHg — ABNORMAL LOW (ref 44.0–60.0)
pH, Ven: 7.34 (ref 7.250–7.430)
pH, Ven: 7.38 (ref 7.250–7.430)
pO2, Ven: 49 mmHg — ABNORMAL HIGH (ref 32.0–45.0)
pO2, Ven: 50 mmHg — ABNORMAL HIGH (ref 32.0–45.0)

## 2021-07-31 LAB — CBC
HCT: 24.4 % — ABNORMAL LOW (ref 36.0–46.0)
Hemoglobin: 7.5 g/dL — ABNORMAL LOW (ref 12.0–15.0)
MCH: 22.7 pg — ABNORMAL LOW (ref 26.0–34.0)
MCHC: 30.7 g/dL (ref 30.0–36.0)
MCV: 73.9 fL — ABNORMAL LOW (ref 80.0–100.0)
Platelets: 293 10*3/uL (ref 150–400)
RBC: 3.3 MIL/uL — ABNORMAL LOW (ref 3.87–5.11)
RDW: 15.9 % — ABNORMAL HIGH (ref 11.5–15.5)
WBC: 21.6 10*3/uL — ABNORMAL HIGH (ref 4.0–10.5)
nRBC: 0 % (ref 0.0–0.2)

## 2021-07-31 LAB — BASIC METABOLIC PANEL
Anion gap: 10 (ref 5–15)
BUN: 37 mg/dL — ABNORMAL HIGH (ref 6–20)
CO2: 19 mmol/L — ABNORMAL LOW (ref 22–32)
Calcium: 8.5 mg/dL — ABNORMAL LOW (ref 8.9–10.3)
Chloride: 111 mmol/L (ref 98–111)
Creatinine, Ser: 0.69 mg/dL (ref 0.44–1.00)
GFR, Estimated: >60 mL/min (ref >60)
Glucose, Bld: 334 mg/dL — ABNORMAL HIGH (ref 70–99)
Potassium: 3.1 mmol/L — ABNORMAL LOW (ref 3.5–5.1)
Sodium: 140 mmol/L (ref 135–145)

## 2021-07-31 LAB — HIV ANTIBODY (ROUTINE TESTING W REFLEX): HIV Screen 4th Generation wRfx: NONREACTIVE

## 2021-07-31 LAB — PROCALCITONIN: Procalcitonin: 0.19 ng/mL

## 2021-07-31 LAB — MAGNESIUM: Magnesium: 1.7 mg/dL (ref 1.7–2.4)

## 2021-07-31 LAB — LACTIC ACID, PLASMA: Lactic Acid, Venous: 1.8 mmol/L (ref 0.5–1.9)

## 2021-07-31 MED ORDER — CEFEPIME HCL 1 G IJ SOLR
1.0000 g | Freq: Three times a day (TID) | INTRAMUSCULAR | Status: AC
  Administered 2021-07-31 – 2021-08-01 (×4): 1 g via INTRAVENOUS
  Filled 2021-07-31 (×8): qty 1

## 2021-07-31 MED ORDER — INSULIN ASPART 100 UNIT/ML IJ SOLN
0.0000 [IU] | Freq: Three times a day (TID) | INTRAMUSCULAR | Status: DC
  Administered 2021-07-31 (×3): 1 [IU] via SUBCUTANEOUS
  Administered 2021-08-01: 12:00:00 3 [IU] via SUBCUTANEOUS
  Administered 2021-08-01 – 2021-08-02 (×2): 2 [IU] via SUBCUTANEOUS
  Filled 2021-07-31 (×4): qty 1

## 2021-07-31 MED ORDER — POTASSIUM CHLORIDE 10 MEQ/100ML IV SOLN
10.0000 meq | INTRAVENOUS | Status: AC
  Administered 2021-07-31 (×2): 10 meq via INTRAVENOUS
  Filled 2021-07-31 (×2): qty 100

## 2021-07-31 MED ORDER — INSULIN ASPART 100 UNIT/ML IJ SOLN
3.0000 [IU] | Freq: Three times a day (TID) | INTRAMUSCULAR | Status: DC
  Administered 2021-07-31 – 2021-08-02 (×4): 3 [IU] via SUBCUTANEOUS
  Filled 2021-07-31 (×3): qty 1

## 2021-07-31 MED ORDER — LACTATED RINGERS IV BOLUS
1000.0000 mL | Freq: Once | INTRAVENOUS | Status: AC
  Administered 2021-07-31: 1000 mL via INTRAVENOUS

## 2021-07-31 MED ORDER — POTASSIUM CHLORIDE 10 MEQ/100ML IV SOLN
10.0000 meq | INTRAVENOUS | Status: AC
  Administered 2021-07-31 (×3): 10 meq via INTRAVENOUS
  Filled 2021-07-31 (×3): qty 100

## 2021-07-31 MED ORDER — INSULIN GLARGINE-YFGN 100 UNIT/ML ~~LOC~~ SOLN
10.0000 [IU] | SUBCUTANEOUS | Status: DC
  Administered 2021-07-31: 10 [IU] via SUBCUTANEOUS
  Filled 2021-07-31 (×3): qty 0.1

## 2021-07-31 MED ORDER — INSULIN STARTER KIT- PEN NEEDLES (ENGLISH)
1.0000 | Freq: Once | Status: AC
  Administered 2021-07-31: 1
  Filled 2021-07-31: qty 1

## 2021-07-31 MED ORDER — LIVING WELL WITH DIABETES BOOK
Freq: Once | Status: AC
  Filled 2021-07-31: qty 1

## 2021-07-31 MED ORDER — LACTATED RINGERS IV SOLN: INTRAVENOUS | Status: DC

## 2021-07-31 MED ORDER — POTASSIUM PHOSPHATES 15 MMOLE/5ML IV SOLN
30.0000 mmol | Freq: Once | INTRAVENOUS | Status: AC
  Administered 2021-07-31: 30 mmol via INTRAVENOUS
  Filled 2021-07-31: qty 10

## 2021-07-31 MED ORDER — INSULIN ASPART 100 UNIT/ML IJ SOLN: 0.0000 [IU] | Freq: Every day | INTRAMUSCULAR | Status: DC

## 2021-07-31 NOTE — Progress Notes (Signed)
New bag of Potassium Chloride given. CVC triple lumen in Right Internal Jugular.

## 2021-07-31 NOTE — Progress Notes (Signed)
Report received from [facility]: Patient came in with DKA. History of DM Type 2. Patient states "I take my metformin at home." CBG on arrival to the hospital in the ED was 500.  Patient Alert and Oriented. Arrived floor at 17:20 pm. B/S prior to arrival was 133. Patient currently on NS at 10 ml/hr, Potassium Phosphate in 5% Dextrose at 85 ml/hr. Potassium Chloride at 100 ml/hr. Patient is on 3 units meal coverage and sliding scale. Prior to coming to the unit, CBG was 133.  This patient is alert and oriented but weak. V/S currently stable. See Flowsheets. CBG taken at 17:30 pm is 146.

## 2021-07-31 NOTE — Consult Note (Signed)
Pharmacy Antibiotic Note  Chelsea Vazquez is a 31 y.o. female admitted on 07/30/2021 with sepsis.  Pharmacy has been consulted for cefepime dosing.  Plan: Adjust Cefepime to 1g IV q8h for CrCl and Total BW Stop vancomycin after discussion with hospitalist F/U C&S, abx deescalation / LOT   Height: 5' (152.4 cm) Weight: 41.3 kg (91 lb 0.8 oz) IBW/kg (Calculated) : 45.5  Temp (24hrs), Avg:98.5 F (36.9 C), Min:98.3 F (36.8 C), Max:98.6 F (37 C)  Recent Labs  Lab 07/30/21 1330 07/30/21 1511 07/30/21 2031 07/30/21 2200 07/30/21 2349 07/31/21 0410 07/31/21 0620  WBC 35.2*  --   --   --   --   --  21.6*  CREATININE  --  0.91 0.96  --  0.69 0.44  --   LATICACIDVEN 3.4*  --  3.4* 1.8  --  1.8  --      Estimated Creatinine Clearance: 66.4 mL/min (by C-G formula based on SCr of 0.44 mg/dL).    Allergies  Allergen Reactions   Amoxicillin Swelling   Naproxen     Throat swelling/itch   Tylenol [Acetaminophen] Swelling    Antimicrobials this admission: 1/22 aztreonam x 1 in the ED  1/22 vancomycin >> 1/23 1/22 cefepime >>   Dose adjustments this admission: N/A  Microbiology results: 1/22 BCx: NGTD 1/22 UCx: pending  1/22 MRSA PCR: Neg   Thank you for allowing pharmacy to be a part of this patients care.  Juliette Alcide, PharmD, BCPS, BCIDP Work Cell: [phone] 07/31/2021 12:13 PM

## 2021-07-31 NOTE — Progress Notes (Addendum)
Inpatient Diabetes Program Recommendations  AACE/ADA: New Consensus Statement on Inpatient Glycemic Control   Target Ranges:  Prepandial:   less than 140 mg/dL      Peak postprandial:   less than 180 mg/dL (1-2 hours)      Critically ill patients:  140 - 180 mg/dL    Latest Reference Range & Units 07/31/21 00:23 07/31/21 01:25 07/31/21 02:25 07/31/21 03:24 07/31/21 04:43 07/31/21 05:55 07/31/21 06:54 07/31/21 07:30  Glucose-Capillary 70 - 99 mg/dL 312 (H) 235 (H) 222 (H) 258 (H) 196 (H) 210 (H) 164 (H) 125 (H)    Latest Reference Range & Units 10/07/19 21:22 10/12/19 21:17 11/20/19 16:29 11/30/20 13:34  Glucose 70 - 99 mg/dL 308 (H) 480 (H) 482 (H) 275 (H)   Review of Glycemic Control  Diabetes history: DM2 Outpatient Diabetes medications: Metformin 850 mg BID Current orders for Inpatient glycemic control: Semglee 10 units daily, Novolog 0-9 units TID with meals, Novolog 0-5 units QHS, Novolog 3 units TID with meals  Inpatient Diabetes Program Recommendations:    Insulin: Noted patient received Semglee 10 units at 6:04 am today and now ordered SQ insulin.  Outpatient: Anticipate patient will need to be discharged on insulin regimen for DM control. Patient states she would prefer to use insulin pens if discharged on insulin. Will also need Rx for glucose monitoring kit (#[id number]).  NOTE: Noted consult for diabetes coordinator. Patient admitted with DKA and was initially on IV insulin which was transitioned to SQ insulin this morning. Per chart, patient has Medicaid and goes to [facility] for follow up care. Metformin is the only DM medication listed on home medication list. In reviewing chart, noted patient was on Lantus and Novolog during pregnancy in 2016 and per consult note on 10/11/2014 by Dr. Gabriel Carina (Endocrinologist) patient was to be discharged on Lantus and Novolog (after delivery). Patient has had multiple ED visits over the past several years and glucose has been consistently  elevated on labs and no insulin is listed as outpatient DM medication. Will plan to speak with patient today.  Addendum 07/31/21_0 :40-Spoke with patient at bedside. Patient appears weak and frail with eyes closed but awakened easily to name. Patient states that she has Medicaid and goes to [facility]. Reports last went to [facility] about 2 months ago. Patient states that she was taking Metformin for her DM. Inquired about insulin and patient confirms that she use to take insulin when she was pregnant in 2016 and was continued on insulin after delivery. Patient is not sure how long it has been since she took insulin but reports her diabetes was better and she was able to come off the insulin. Patient reports she used vial/syringe for insulin injections in the past. Patient states that she does not check glucose at home and does not have a glucometer or testing supplies. Asked patient several questions about DM (glucose goals, A1C, hypoglycemia) and patient reports she did not know the answers to the questions. Patient's 2 sisters came in room during discussion and were asking questions about DM as they plan to help her get DM under control.  Discussed DM control, DKA, glucose goals, and A1C goals. Patient is not sure what an A1C is and unsure if they have checked it at [facility]. Patient is willing to take insulin outpatient. Discussed Semglee and Novolog insulin as currently ordered. Reviewed insulin pen versus vial/syringe and patient states she would like to use insulin pens outpatient. Reviewed and demonstrated how to  use an insulin pen and patient was able to return demonstrate how to use the insulin pen. Patient then stated she felt like she was going to throw up and she was given emesis bag at bedside and she vomited. RN came in room shortly after patient vomited and went to get medication for nausea.  Informed patient that diabetes coordinator will plan to follow up with her again  tomorrow. Patient verbalized understanding of information and has no questions at this time. Informed Carly, RN that patient has taken insulin in the past and would prefer to use insulin pens if discharged on insulin.   Thanks, Barnie Alderman, RN, MSN, CDE Diabetes Coordinator Inpatient Diabetes Program [phone] (Team Pager from 8am to 5pm)

## 2021-07-31 NOTE — Progress Notes (Signed)
Progress Note    Chelsea Vazquez  SMO:[id number] DOB: 03/29/1991  DOA: 07/30/2021 PCP: [facility] [place]      Brief Narrative:    Medical records reviewed and are as summarized below:  Chelsea Vazquez is a 31 y.o. female with medical history significant for type II DM, depression, anxiety, who presented to the hospital with nausea and vomiting.      Assessment/Plan:   Principal Problem:   DKA (diabetic ketoacidosis) ([place]) Active Problems:   Hyperglycemia due to diabetes mellitus (HCC)   Leukocytosis   Thrombocytosis   Hyponatremia   Microcytic anemia   Dehydration   SIRS (systemic inflammatory response syndrome) (HCC)    Body mass index is 17.78 kg/m.  (Underweight)   Type II DM with DKA: DKA has resolved.  She has been started on insulin glargine.  Use NovoLog as needed for hyperglycemia.  Check hemoglobin A1c.  Severe sepsis secondary to UTI, leukocytosis: WBC is improving.  Continue empiric IV antibiotics  Iron deficiency anemia: Drop in hemoglobin may be partly due to hemodilution.  Give IV iron infusion followed by ferrous sulfate supplement Monitor H&H.  Hypokalemia and hypophosphatemia: Replete with IV potassium phosphate.  Monitor electrolytes.  Hyponatremia, thrombocytosis: Improved    Diet Order             Diet NPO time specified  Diet effective now                      Consultants: Intensivist  Procedures: Right IJ central line placement on 07/30/2021    Medications:    Chlorhexidine Gluconate Cloth  6 each Topical Q0600   enoxaparin (LOVENOX) injection  40 mg Subcutaneous Q24H   insulin aspart  0-5 Units Subcutaneous QHS   insulin aspart  0-9 Units Subcutaneous TID WC   insulin aspart  3 Units Subcutaneous TID WC   insulin glargine-yfgn  10 Units Subcutaneous Q24H   insulin starter kit- pen needles  1 kit Other Once   living well with diabetes book   Does not apply Once   Continuous  Infusions:  ceFEPime (MAXIPIME) IV 2 g (07/31/21 0229)   dextrose 5% lactated ringers Stopped (07/31/21 0600)   insulin Stopped (07/31/21 0758)   lactated ringers     lactated ringers 125 mL/hr at 07/31/21 0602   vancomycin     vancomycin       Anti-infectives (From admission, onward)    Start     Dose/Rate Route Frequency Ordered Stop   07/31/21 2300  vancomycin (VANCOREADY) IVPB 750 mg/150 mL        750 mg 150 mL/hr over 60 Minutes Intravenous Every 24 hours 07/30/21 2210     07/31/21 0000  ceFEPIme (MAXIPIME) 2 g in sodium chloride 0.9 % 100 mL IVPB        2 g 200 mL/hr over 30 Minutes Intravenous Every 12 hours 07/30/21 2210     07/30/21 1515  aztreonam (AZACTAM) 2 g in sodium chloride 0.9 % 100 mL IVPB        2 g 200 mL/hr over 30 Minutes Intravenous  Once 07/30/21 1507 07/30/21 1612   07/30/21 1515  metroNIDAZOLE (FLAGYL) IVPB 500 mg        500 mg 100 mL/hr over 60 Minutes Intravenous  Once 07/30/21 1507 07/30/21 1801   07/30/21 1515  vancomycin (VANCOCIN) IVPB 1000 mg/200 mL premix        1,000 mg 200 mL/hr over  60 Minutes Intravenous  Once 07/30/21 1507                Family Communication/Anticipated D/C date and plan/Code Status   DVT prophylaxis: enoxaparin (LOVENOX) injection 40 mg Start: 07/30/21 2200 SCDs Start: 07/30/21 1649     Code Status: Full Code  Family Communication: None Disposition Plan: Plan to discharge home in 2 to 3 days   Status is: Observation  The patient will require care spanning > 2 midnights and should be moved to inpatient because: Sepsis from UTI on IV antibiotics, DKA          Subjective:   Interval events noted.  She does not provide much history  Objective:    Vitals:   07/31/21 0700 07/31/21 0800 07/31/21 0900 07/31/21 1000  BP: 111/72 111/76 119/83 120/83  Pulse: (!) 117 (!) 114 (!) 114 (!) 113  Resp: 15 (!) 23 (!) 32 14  Temp:      TempSrc:      SpO2: 99% 100% 100% 100%  Weight:      Height:        No data found.   Intake/Output Summary (Last 24 hours) at 07/31/2021 1156 Last data filed at 07/31/2021 1000 Gross per 24 hour  Intake 3311.35 ml  Output 2000 ml  Net 1311.35 ml   Filed Weights   07/30/21 1320 07/30/21 2013  Weight: 45.4 kg 41.3 kg    Exam:  GEN: NAD SKIN: Warm and dry EYES: EOMI ENT: MMM CV: RRR PULM: CTA B ABD: soft, ND, NT, +BS CNS: Drowsy but arousable.  Not very communicative EXT: No edema or tenderness        Data Reviewed:   I have personally reviewed following labs and imaging studies:  Labs: Labs show the following:   Basic Metabolic Panel: Recent Labs  Lab 07/30/21 1511 07/30/21 2031 07/30/21 2349 07/31/21 0410  NA 134* 135 140 141  K 3.8 4.1 3.1* 3.2*  CL 95* 101 111 115*  CO2 16* 16* 19* 23  GLUCOSE 582* 558* 334* 239*  BUN 38* 44* 37* 30*  CREATININE 0.91 0.96 0.69 0.44  CALCIUM 9.7 9.1 8.5* 7.7*  MG  --   --   --  1.7  PHOS  --   --   --  1.6*   GFR Estimated Creatinine Clearance: 66.4 mL/min (by C-G formula based on SCr of 0.44 mg/dL). Liver Function Tests: Recent Labs  Lab 07/30/21 1511 07/31/21 0410  AST 15 12*  ALT 15 9  ALKPHOS 85 49  BILITOT 1.1 0.3  PROT 8.7* 5.3*  ALBUMIN 3.8 2.3*   Recent Labs  Lab 07/30/21 2031  LIPASE 22   No results for input(s): AMMONIA in the last 168 hours. Coagulation profile Recent Labs  Lab 07/30/21 1511  INR 1.0    CBC: Recent Labs  Lab 07/30/21 1330 07/31/21 0620  WBC 35.2* 21.6*  NEUTROABS 31.7*  --   HGB 11.8* 7.5*  HCT 39.6 24.4*  MCV 74.3* 73.9*  PLT 548* 293   Cardiac Enzymes: No results for input(s): CKTOTAL, CKMB, CKMBINDEX, TROPONINI in the last 168 hours. BNP (last 3 results) No results for input(s): PROBNP in the last 8760 hours. CBG: Recent Labs  Lab 07/31/21 0324 07/31/21 0443 07/31/21 0555 07/31/21 0654 07/31/21 0730  GLUCAP 258* 196* 210* 164* 125*   D-Dimer: No results for input(s): DDIMER in the last 72 hours. Hgb  A1c: No results for input(s): HGBA1C in the last 72 hours. Lipid  Profile: No results for input(s): CHOL, HDL, LDLCALC, TRIG, CHOLHDL, LDLDIRECT in the last 72 hours. Thyroid function studies: No results for input(s): TSH, T4TOTAL, T3FREE, THYROIDAB in the last 72 hours.  Invalid input(s): FREET3 Anemia work up: Recent Labs    07/31/21 0410  FERRITIN 13  TIBC 284  IRON 14*   Sepsis Labs: Recent Labs  Lab 07/30/21 1330 07/30/21 2031 07/30/21 2200 07/31/21 0410 07/31/21 0620  PROCALCITON  --  0.30  --  0.19  --   WBC 35.2*  --   --   --  21.6*  LATICACIDVEN 3.4* 3.4* 1.8 1.8  --     Microbiology Recent Results (from the past 240 hour(s))  Blood Culture (routine x 2)     Status: None (Preliminary result)   Collection Time: 07/30/21  1:30 PM   Specimen: BLOOD  Result Value Ref Range Status   Specimen Description BLOOD RIGHT ANTECUBITAL  Final   Special Requests   Final    BOTTLES DRAWN AEROBIC AND ANAEROBIC Blood Culture adequate volume   Culture   Final    NO GROWTH < 24 HOURS Performed at [facility], [address], [place], [place] [address]    Report Status PENDING  Incomplete  Resp Panel by RT-PCR (Flu A&B, Covid) Nasopharyngeal Swab     Status: None   Collection Time: 07/30/21  7:48 PM   Specimen: Nasopharyngeal Swab; Nasopharyngeal(NP) swabs in vial transport medium  Result Value Ref Range Status   SARS Coronavirus 2 by RT PCR NEGATIVE NEGATIVE Final    Comment: (NOTE) SARS-CoV-2 target nucleic acids are NOT DETECTED.  The SARS-CoV-2 RNA is generally detectable in upper respiratory specimens during the acute phase of infection. The lowest concentration of SARS-CoV-2 viral copies this assay can detect is 138 copies/mL. A negative result does not preclude SARS-Cov-2 infection and should not be used as the sole basis for treatment or other patient management decisions. A negative result may occur with  improper specimen collection/handling,  submission of specimen other than nasopharyngeal swab, presence of viral mutation(s) within the areas targeted by this assay, and inadequate number of viral copies(<138 copies/mL). A negative result must be combined with clinical observations, patient history, and epidemiological information. The expected result is Negative.  Fact Sheet for Patients:  EntrepreneurPulse.com.au  Fact Sheet for Healthcare Providers:  IncredibleEmployment.be  This test is no t yet approved or cleared by the [place] FDA and  has been authorized for detection and/or diagnosis of SARS-CoV-2 by FDA under an Emergency Use Authorization (EUA). This EUA will remain  in effect (meaning this test can be used) for the duration of the COVID-19 declaration under Section 564(b)(1) of the Act, 21 U.S.C.section 360bbb-3(b)(1), unless the authorization is terminated  or revoked sooner.       Influenza A by PCR NEGATIVE NEGATIVE Final   Influenza B by PCR NEGATIVE NEGATIVE Final    Comment: (NOTE) The Xpert Xpress SARS-CoV-2/FLU/RSV plus assay is intended as an aid in the diagnosis of influenza from Nasopharyngeal swab specimens and should not be used as a sole basis for treatment. Nasal washings and aspirates are unacceptable for Xpert Xpress SARS-CoV-2/FLU/RSV testing.  Fact Sheet for Patients: EntrepreneurPulse.com.au  Fact Sheet for Healthcare Providers: IncredibleEmployment.be  This test is not yet approved or cleared by the [place] FDA and has been authorized for detection and/or diagnosis of SARS-CoV-2 by FDA under an Emergency Use Authorization (EUA). This EUA will remain in effect (meaning this test can be used)  for the duration of the COVID-19 declaration under Section 564(b)(1) of the Act, 21 U.S.C. section 360bbb-3(b)(1), unless the authorization is terminated or revoked.  Performed at [facility], [address] [place], [place] [address]   MRSA Next Gen by PCR, Nasal     Status: None   Collection Time: 07/30/21  7:54 PM   Specimen: Nasal Mucosa; Nasal Swab  Result Value Ref Range Status   MRSA by PCR Next Gen NOT DETECTED NOT DETECTED Final    Comment: (NOTE) The GeneXpert MRSA Assay (FDA approved for NASAL specimens only), is one component of a comprehensive MRSA colonization surveillance program. It is not intended to diagnose MRSA infection nor to guide or monitor treatment for MRSA infections. Test performance is not FDA approved in patients less than 72 years old. Performed at [facility], [address] [place], [place] [address]   Blood Culture (routine x 2)     Status: None (Preliminary result)   Collection Time: 07/30/21  8:31 PM   Specimen: BLOOD  Result Value Ref Range Status   Specimen Description BLOOD LEFT ANTECUBITAL  Final   Special Requests IN PEDIATRIC BOTTLE Blood Culture adequate volume  Final   Culture   Final    NO GROWTH < 12 HOURS Performed at [facility], [address] [place],  [address]    Report Status PENDING  Incomplete    Procedures and diagnostic studies:  DG Chest Port 1 View  Result Date: 07/30/2021 CLINICAL DATA:  Central line EXAM: PORTABLE CHEST 1 VIEW COMPARISON:  07/30/2021 FINDINGS: Right IJ central venous catheter tip at the cavoatrial region. No visible right pneumothorax. No focal airspace disease, pleural effusion or pneumothorax. Normal cardiac size IMPRESSION: Right IJ central venous catheter tip at the cavoatrial junction. No pneumothorax Electronically Signed   By: Donavan Foil M.D.   On: 07/30/2021 22:08   DG Chest Port 1 View  Result Date: 07/30/2021 CLINICAL DATA:  Questionable sepsis. Altered mental status. Nausea and vomiting for 2 days. EXAM: PORTABLE CHEST 1 VIEW COMPARISON:  10/12/2019 FINDINGS: The heart size and mediastinal contours are within normal limits. Both lungs are clear. The  visualized skeletal structures are unremarkable. IMPRESSION: No active disease. Electronically Signed   By: Kerby Moors M.D.   On: 07/30/2021 14:02               LOS: 0 days   Remmy Riffe  [facility]   Pager on [email or web address]. If 7PM-7AM, please contact night-coverage at [email or web address]     07/31/2021, 11:56 AM

## 2021-08-01 DIAGNOSIS — E1110 Type 2 diabetes mellitus with ketoacidosis without coma: Secondary | ICD-10-CM | POA: Diagnosis not present

## 2021-08-01 DIAGNOSIS — D509 Iron deficiency anemia, unspecified: Secondary | ICD-10-CM | POA: Diagnosis not present

## 2021-08-01 LAB — RESPIRATORY PANEL BY PCR

## 2021-08-01 LAB — CBC WITH DIFFERENTIAL/PLATELET
Abs Immature Granulocytes: 0.17 10*3/uL — ABNORMAL HIGH (ref 0.00–0.07)
Basophils Absolute: 0 10*3/uL (ref 0.0–0.1)
Basophils Relative: 0 %
Eosinophils Absolute: 0 10*3/uL (ref 0.0–0.5)
Eosinophils Relative: 0 %
HCT: 20.3 % — ABNORMAL LOW (ref 36.0–46.0)
Hemoglobin: 6.2 g/dL — ABNORMAL LOW (ref 12.0–15.0)
Immature Granulocytes: 1 %
Lymphocytes Relative: 14 %
Lymphs Abs: 1.9 10*3/uL (ref 0.7–4.0)
MCH: 22.8 pg — ABNORMAL LOW (ref 26.0–34.0)
MCHC: 30.5 g/dL (ref 30.0–36.0)
MCV: 74.6 fL — ABNORMAL LOW (ref 80.0–100.0)
Monocytes Absolute: 1.1 10*3/uL — ABNORMAL HIGH (ref 0.1–1.0)
Monocytes Relative: 8 %
Neutro Abs: 10.1 10*3/uL — ABNORMAL HIGH (ref 1.7–7.7)
Neutrophils Relative %: 77 %
Platelets: 212 10*3/uL (ref 150–400)
RBC: 2.72 MIL/uL — ABNORMAL LOW (ref 3.87–5.11)
RDW: 16.2 % — ABNORMAL HIGH (ref 11.5–15.5)
WBC: 13.3 10*3/uL — ABNORMAL HIGH (ref 4.0–10.5)
nRBC: 0 % (ref 0.0–0.2)

## 2021-08-01 LAB — URINE CULTURE: Culture: >=100000 — AB

## 2021-08-01 LAB — GLUCOSE, CAPILLARY
Glucose-Capillary: 128 mg/dL — ABNORMAL HIGH (ref 70–99)
Glucose-Capillary: 190 mg/dL — ABNORMAL HIGH (ref 70–99)
Glucose-Capillary: 196 mg/dL — ABNORMAL HIGH (ref 70–99)
Glucose-Capillary: 229 mg/dL — ABNORMAL HIGH (ref 70–99)
Glucose-Capillary: 88 mg/dL (ref 70–99)

## 2021-08-01 LAB — ABO/RH: ABO/RH(D): AB POS

## 2021-08-01 LAB — BASIC METABOLIC PANEL
Anion gap: 4 — ABNORMAL LOW (ref 5–15)
BUN: 11 mg/dL (ref 6–20)
CO2: 28 mmol/L (ref 22–32)
Calcium: 8.2 mg/dL — ABNORMAL LOW (ref 8.9–10.3)
Chloride: 106 mmol/L (ref 98–111)
Creatinine, Ser: 0.38 mg/dL — ABNORMAL LOW (ref 0.44–1.00)
GFR, Estimated: >60 mL/min (ref >60)
Glucose, Bld: 180 mg/dL — ABNORMAL HIGH (ref 70–99)
Potassium: 3.6 mmol/L (ref 3.5–5.1)
Sodium: 138 mmol/L (ref 135–145)

## 2021-08-01 LAB — HEMOGLOBIN A1C
Hgb A1c MFr Bld: 15.3 % — ABNORMAL HIGH (ref 4.8–5.6)
Mean Plasma Glucose: 392 mg/dL

## 2021-08-01 LAB — PHOSPHORUS: Phosphorus: 2.1 mg/dL — ABNORMAL LOW (ref 2.5–4.6)

## 2021-08-01 LAB — MAGNESIUM: Magnesium: 1.8 mg/dL (ref 1.7–2.4)

## 2021-08-01 LAB — PREPARE RBC (CROSSMATCH)

## 2021-08-01 LAB — FOLATE: Folate: 9.1 ng/mL (ref >5.9)

## 2021-08-01 LAB — PROCALCITONIN: Procalcitonin: 0.1 ng/mL

## 2021-08-01 LAB — VITAMIN B12: Vitamin B-12: 337 pg/mL (ref 180–914)

## 2021-08-01 MED ORDER — INSULIN GLARGINE-YFGN 100 UNIT/ML ~~LOC~~ SOLN
10.0000 [IU] | Freq: Every day | SUBCUTANEOUS | Status: DC
  Filled 2021-08-01: qty 0.1

## 2021-08-01 MED ORDER — ENOXAPARIN SODIUM 30 MG/0.3ML IJ SOSY: 30.0000 mg | PREFILLED_SYRINGE | INTRAMUSCULAR | Status: DC

## 2021-08-01 MED ORDER — SODIUM CHLORIDE 0.9% IV SOLUTION: Freq: Once | INTRAVENOUS | Status: DC

## 2021-08-01 MED ORDER — SODIUM CHLORIDE 0.9 % IV SOLN
1.0000 g | INTRAVENOUS | Status: DC
  Administered 2021-08-02: 09:00:00 1 g via INTRAVENOUS
  Filled 2021-08-01: qty 1
  Filled 2021-08-01: qty 10

## 2021-08-01 MED ORDER — INSULIN GLARGINE-YFGN 100 UNIT/ML ~~LOC~~ SOLN
12.0000 [IU] | Freq: Every day | SUBCUTANEOUS | Status: DC
  Administered 2021-08-01 – 2021-08-02 (×2): 12 [IU] via SUBCUTANEOUS
  Filled 2021-08-01 (×3): qty 0.12

## 2021-08-01 NOTE — Progress Notes (Signed)
Patient is a 30 y.o.f, FC, came in due to DKA. Patient was alert and oriented today. Patient's diet was changed from NPO to Carb modified. Patient ate only 15 % of each meal.   Patient administered all her medications today. Concerns about the Triple Lumen in her right jugular. The doctor placed an IV consult order for insertion of a Peripheral IV.   Central Line intact upon removal. Antibiotic  Cefepime currently infusing in 20G in Right Forearm.   Informed consent completed and signed.1 unit of blood to be transfused but only one IV available. Will inform night shift nurse.

## 2021-08-01 NOTE — Progress Notes (Signed)
Progress Note    Chelsea Vazquez  ZOX:[id number] DOB: 1990-11-02  DOA: 07/30/2021 PCP: [facility]      Brief Narrative:    Medical records reviewed and are as summarized below:  Chelsea Vazquez is a 31 y.o. female with medical history significant for type II DM, depression, anxiety, who presented to the hospital with nausea and vomiting.      Assessment/Plan:   Principal Problem:   DKA (diabetic ketoacidosis) (HCC) Active Problems:   Hyperglycemia due to diabetes mellitus (HCC)   Leukocytosis   Thrombocytosis   Hyponatremia   Microcytic anemia   Dehydration   SIRS (systemic inflammatory response syndrome) (HCC)    Body mass index is 19.02 kg/m.  (Underweight)   Type II DM with DKA: DKA has resolved.  Continue insulin glargine and NovoLog as needed for hyperglycemia.  Hemoglobin A1c was 15.3.  Severe sepsis secondary to UTI, leukocytosis: Switch IV cefepime to IV ceftriaxone.  Iron deficiency anemia, acute on chronic anemia: Hemoglobin was 11.8 on admission and has dropped to 6.2 today.  Etiology unclear but hemodilution may be contributing to this.  Transfuse 1 unit of packed red blood cells.  Risk, benefits and alternatives to blood transfusion was were discussed with the patient and she agrees to proceed with treatment.  Hypokalemia and hypophosphatemia: Improving.  Monitor levels.  Hyponatremia, thrombocytosis: Improved  Discomfort from right IJ central line: Peripheral IV line has been placed.  Discontinue central line.  Diet Order             Diet Carb Modified Fluid consistency: Thin; Room service appropriate? Yes  Diet effective now                      Consultants: Intensivist  Procedures: Right IJ central line placement on 07/30/2021    Medications:    sodium chloride   Intravenous Once   sodium chloride   Intravenous Once   Chlorhexidine Gluconate Cloth  6 each Topical Q0600   insulin aspart   0-5 Units Subcutaneous QHS   insulin aspart  0-9 Units Subcutaneous TID WC   insulin aspart  3 Units Subcutaneous TID WC   insulin glargine-yfgn  12 Units Subcutaneous Daily   Continuous Infusions:  ceFEPime (MAXIPIME) IV Stopped (08/01/21 1623)   [START ON 08/02/2021] cefTRIAXone (ROCEPHIN)  IV       Anti-infectives (From admission, onward)    Start     Dose/Rate Route Frequency Ordered Stop   08/02/21 1000  cefTRIAXone (ROCEPHIN) 1 g in sodium chloride 0.9 % 100 mL IVPB        1 g 200 mL/hr over 30 Minutes Intravenous Every 24 hours 08/01/21 1327     07/31/21 2300  vancomycin (VANCOREADY) IVPB 750 mg/150 mL  Status:  Discontinued        750 mg 150 mL/hr over 60 Minutes Intravenous Every 24 hours 07/30/21 2210 07/31/21 1209   07/31/21 1400  ceFEPIme (MAXIPIME) 1 g in sodium chloride 0.9 % 100 mL IVPB        1 g 200 mL/hr over 30 Minutes Intravenous Every 8 hours 07/31/21 1210 08/01/21 2359   07/31/21 0000  ceFEPIme (MAXIPIME) 2 g in sodium chloride 0.9 % 100 mL IVPB  Status:  Discontinued        2 g 200 mL/hr over 30 Minutes Intravenous Every 12 hours 07/30/21 2210 07/31/21 1210   07/30/21 1515  aztreonam (AZACTAM) 2 g in sodium  chloride 0.9 % 100 mL IVPB        2 g 200 mL/hr over 30 Minutes Intravenous  Once 07/30/21 1507 07/30/21 1612   07/30/21 1515  metroNIDAZOLE (FLAGYL) IVPB 500 mg        500 mg 100 mL/hr over 60 Minutes Intravenous  Once 07/30/21 1507 07/30/21 1801   07/30/21 1515  vancomycin (VANCOCIN) IVPB 1000 mg/200 mL premix  Status:  Discontinued        1,000 mg 200 mL/hr over 60 Minutes Intravenous  Once 07/30/21 1507 07/31/21 1209              Family Communication/Anticipated D/C date and plan/Code Status   DVT prophylaxis: SCDs Start: 07/30/21 1649     Code Status: Full Code  Family Communication: None Disposition Plan: Plan to discharge home tomorrow  Status is: Inpatient  Remains inpatient appropriate because: Anemia requiring blood  transfusion,  IV antibiotics               Subjective:   Interval events noted.  She complains of generalized weakness.  She had her menstrual period about a week ago but it stopped after 3 days.  No hematemesis or rectal bleeding.  She complained of sore throat and discomfort from the right IJ central line.  Objective:    Vitals:   08/01/21 0453 08/01/21 0800 08/01/21 1247 08/01/21 1616  BP: 116/80 122/87 120/86 124/85  Pulse: (!) 112 (!) 110 (!) 110 (!) 105  Resp: 18  15 19   Temp: 98.2 F (36.8 C)  98.2 F (36.8 C) 98.4 F (36.9 C)  TempSrc: Oral     SpO2: 100% 97% 100% 100%  Weight:      Height:       No data found.   Intake/Output Summary (Last 24 hours) at 08/01/2021 1709 Last data filed at 08/01/2021 1637 Gross per 24 hour  Intake 507.37 ml  Output --  Net 507.37 ml   Filed Weights   07/30/21 1320 07/30/21 2013 07/31/21 1727  Weight: 45.4 kg 41.3 kg 44.2 kg    Exam:  GEN: NAD SKIN: No rash EYES: EOMI ENT: MMM CV: RRR PULM: CTA B ABD: soft, ND, NT, +BS CNS: AAO x 3, non focal EXT: No edema or tenderness       Data Reviewed:   I have personally reviewed following labs and imaging studies:  Labs: Labs show the following:   Basic Metabolic Panel: Recent Labs  Lab 07/30/21 1511 07/30/21 2031 07/30/21 2349 07/31/21 0410 08/01/21 0605  NA 134* 135 140 141 138  K 3.8 4.1 3.1* 3.2* 3.6  CL 95* 101 111 115* 106  CO2 16* 16* 19* 23 28  GLUCOSE 582* 558* 334* 239* 180*  BUN 38* 44* 37* 30* 11  CREATININE 0.91 0.96 0.69 0.44 0.38*  CALCIUM 9.7 9.1 8.5* 7.7* 8.2*  MG  --   --   --  1.7 1.8  PHOS  --   --   --  1.6* 2.1*   GFR Estimated Creatinine Clearance: 71.1 mL/min (A) (by C-G formula based on SCr of 0.38 mg/dL (L)). Liver Function Tests: Recent Labs  Lab 07/30/21 1511 07/31/21 0410  AST 15 12*  ALT 15 9  ALKPHOS 85 49  BILITOT 1.1 0.3  PROT 8.7* 5.3*  ALBUMIN 3.8 2.3*   Recent Labs  Lab 07/30/21 2031  LIPASE 22    No results for input(s): AMMONIA in the last 168 hours. Coagulation profile Recent Labs  Lab 07/30/21  1511  INR 1.0    CBC: Recent Labs  Lab 07/30/21 1330 07/31/21 0620 08/01/21 0605  WBC 35.2* 21.6* 13.3*  NEUTROABS 31.7*  --  10.1*  HGB 11.8* 7.5* 6.2*  HCT 39.6 24.4* 20.3*  MCV 74.3* 73.9* 74.6*  PLT 548* 293 212   Cardiac Enzymes: No results for input(s): CKTOTAL, CKMB, CKMBINDEX, TROPONINI in the last 168 hours. BNP (last 3 results) No results for input(s): PROBNP in the last 8760 hours. CBG: Recent Labs  Lab 07/31/21 1944 08/01/21 0030 08/01/21 0802 08/01/21 1247 08/01/21 1620  GLUCAP 167* 196* 190* 229* 88   D-Dimer: No results for input(s): DDIMER in the last 72 hours. Hgb A1c: Recent Labs    07/31/21 0441  HGBA1C 15.3*   Lipid Profile: No results for input(s): CHOL, HDL, LDLCALC, TRIG, CHOLHDL, LDLDIRECT in the last 72 hours. Thyroid function studies: No results for input(s): TSH, T4TOTAL, T3FREE, THYROIDAB in the last 72 hours.  Invalid input(s): FREET3 Anemia work up: Recent Labs    07/31/21 0410 08/01/21 0605 08/01/21 0946  VITAMINB12  --   --  337  FOLATE  --  9.1  --   FERRITIN 13  --   --   TIBC 284  --   --   IRON 14*  --   --    Sepsis Labs: Recent Labs  Lab 07/30/21 1330 07/30/21 2031 07/30/21 2200 07/31/21 0410 07/31/21 0620 08/01/21 0605  PROCALCITON  --  0.30  --  0.19  --  0.10  WBC 35.2*  --   --   --  21.6* 13.3*  LATICACIDVEN 3.4* 3.4* 1.8 1.8  --   --     Microbiology Recent Results (from the past 240 hour(s))  Blood Culture (routine x 2)     Status: None (Preliminary result)   Collection Time: 07/30/21  1:30 PM   Specimen: BLOOD  Result Value Ref Range Status   Specimen Description BLOOD RIGHT ANTECUBITAL  Final   Special Requests   Final    BOTTLES DRAWN AEROBIC AND ANAEROBIC Blood Culture adequate volume   Culture   Final    NO GROWTH 2 DAYS Performed at [facility], [address]  [place], [place] [address]    Report Status PENDING  Incomplete  Urine Culture     Status: Abnormal   Collection Time: 07/30/21  7:48 PM   Specimen: In/Out Cath Urine  Result Value Ref Range Status   Specimen Description   Final    IN/OUT CATH URINE Performed at [facility], [address], [place], [place] [address]    Special Requests   Final    NONE Performed at [facility], [address] [place], [place] [address]    Culture (A)  Final    >=100,000 COLONIES/mL LACTOBACILLUS SPECIES Standardized susceptibility testing for this organism is not available. Performed at [facility], [address], [place], [place] [address]    Report Status 08/01/2021 FINAL  Final  Resp Panel by RT-PCR (Flu A&B, Covid) Nasopharyngeal Swab     Status: None   Collection Time: 07/30/21  7:48 PM   Specimen: Nasopharyngeal Swab; Nasopharyngeal(NP) swabs in vial transport medium  Result Value Ref Range Status   SARS Coronavirus 2 by RT PCR NEGATIVE NEGATIVE Final    Comment: (NOTE) SARS-CoV-2 target nucleic acids are NOT DETECTED.  The SARS-CoV-2 RNA is generally detectable in upper respiratory specimens during the acute phase of infection. The lowest concentration of SARS-CoV-2  viral copies this assay can detect is 138 copies/mL. A negative result does not preclude SARS-Cov-2 infection and should not be used as the sole basis for treatment or other patient management decisions. A negative result may occur with  improper specimen collection/handling, submission of specimen other than nasopharyngeal swab, presence of viral mutation(s) within the areas targeted by this assay, and inadequate number of viral copies(<138 copies/mL). A negative result must be combined with clinical observations, patient history, and epidemiological information. The expected result is Negative.  Fact Sheet for Patients:  BloggerCourse.comhttps://www.fda.gov/media/152166/download  Fact Sheet for Healthcare  Providers:  SeriousBroker.ithttps://www.fda.gov/media/152162/download  This test is no t yet approved or cleared by the [place] FDA and  has been authorized for detection and/or diagnosis of SARS-CoV-2 by FDA under an Emergency Use Authorization (EUA). This EUA will remain  in effect (meaning this test can be used) for the duration of the COVID-19 declaration under Section 564(b)(1) of the Act, 21 U.S.C.section 360bbb-3(b)(1), unless the authorization is terminated  or revoked sooner.       Influenza A by PCR NEGATIVE NEGATIVE Final   Influenza B by PCR NEGATIVE NEGATIVE Final    Comment: (NOTE) The Xpert Xpress SARS-CoV-2/FLU/RSV plus assay is intended as an aid in the diagnosis of influenza from Nasopharyngeal swab specimens and should not be used as a sole basis for treatment. Nasal washings and aspirates are unacceptable for Xpert Xpress SARS-CoV-2/FLU/RSV testing.  Fact Sheet for Patients: BloggerCourse.comhttps://www.fda.gov/media/152166/download  Fact Sheet for Healthcare Providers: SeriousBroker.ithttps://www.fda.gov/media/152162/download  This test is not yet approved or cleared by the [place] FDA and has been authorized for detection and/or diagnosis of SARS-CoV-2 by FDA under an Emergency Use Authorization (EUA). This EUA will remain in effect (meaning this test can be used) for the duration of the COVID-19 declaration under Section 564(b)(1) of the Act, 21 U.S.C. section 360bbb-3(b)(1), unless the authorization is terminated or revoked.  Performed at [facility], [address] [place], [place] [address]   MRSA Next Gen by PCR, Nasal     Status: None   Collection Time: 07/30/21  7:54 PM   Specimen: Nasal Mucosa; Nasal Swab  Result Value Ref Range Status   MRSA by PCR Next Gen NOT DETECTED NOT DETECTED Final    Comment: (NOTE) The GeneXpert MRSA Assay (FDA approved for NASAL specimens only), is one component of a comprehensive MRSA colonization surveillance program. It is not  intended to diagnose MRSA infection nor to guide or monitor treatment for MRSA infections. Test performance is not FDA approved in patients less than 265 years old. Performed at [facility], [address] [place], [place] [address]   Blood Culture (routine x 2)     Status: None (Preliminary result)   Collection Time: 07/30/21  8:31 PM   Specimen: BLOOD  Result Value Ref Range Status   Specimen Description BLOOD LEFT ANTECUBITAL  Final   Special Requests IN PEDIATRIC BOTTLE Blood Culture adequate volume  Final   Culture   Final    NO GROWTH 2 DAYS Performed at [facility], [address] [place], [place] [address]    Report Status PENDING  Incomplete    Procedures and diagnostic studies:  DG Chest Port 1 View  Result Date: 07/30/2021 CLINICAL DATA:  Central line EXAM: PORTABLE CHEST 1 VIEW COMPARISON:  07/30/2021 FINDINGS: Right IJ central venous catheter tip at the cavoatrial region. No visible right pneumothorax. No focal airspace disease, pleural effusion or pneumothorax. Normal cardiac size IMPRESSION: Right IJ  central venous catheter tip at the cavoatrial junction. No pneumothorax Electronically Signed   By: Jasmine PangKim  Fujinaga M.D.   On: 07/30/2021 22:08               LOS: 1 day   Timoty Bourke  [facility]   Pager on [email or web address]. If 7PM-7AM, please contact night-coverage at [email or web address]     08/01/2021, 5:09 PM

## 2021-08-01 NOTE — Progress Notes (Addendum)
Inpatient Diabetes Program Recommendations  AACE/ADA: New Consensus Statement on Inpatient Glycemic Control   Target Ranges:  Prepandial:   less than 140 mg/dL      Peak postprandial:   less than 180 mg/dL (1-2 hours)      Critically ill patients:  140 - 180 mg/dL    Latest Reference Range & Units 07/31/21 6:04 07/31/21 07:30 07/31/21 12:12 07/31/21 16:57 07/31/21 17:34 07/31/21 19:44 08/01/21 00:30 08/01/21 08:02  Glucose-Capillary 70 - 99 mg/dL     Semglee 10 units 125 (H)  Novolog 1 unit 137 (H)  Novolog 4 units 133 (H) 146 (H)  Novolog 1 unit 167 (H) 196 (H) 190 (H)    Latest Reference Range & Units 07/31/21 04:41  Hemoglobin A1C 4.8 - 5.6 % 15.3 (H)   Review of Glycemic Control  Diabetes history: DM2 Outpatient Diabetes medications: Metformin 850 mg BID Current orders for Inpatient glycemic control: Semglee 10 units daily, Novolog 0-9 units TID with meals, Novolog 0-5 units QHS, Novolog 3 units TID with meals   Inpatient Diabetes Program Recommendations:     Insulin: Noted patient received Semglee 10 units at 6:04 am on 07/31/21 and received meal coverage once with lunch on 07/31/21. Please consider increasing Semglee to 12 units daily. If patient is still nauseous and vomiting, may want to change CBGs to Q4H and Novolog correction 0-9 units to Q4H.   HbgA1C: A1C 15.3% on 07/31/21 indicating an average glucose of 392 mg/dl over the past 2-3 months.  Outpatient: Anticipate patient will need to be discharged on insulin regimen for DM control. Patient states she would prefer to use insulin pens if discharged on insulin. Will also need Rx for glucose monitoring kit (#[id number]).  Addendum 08/01/21_0 :50-Went by to see patient again regarding DM, A1C and insulin. Patient awake and alert laying in bed. Much more engaged in conversation today. Patient reports that she does not think the Metformin was working for her at all. Patient states that she took Lantus and Novolog during  pregnancy in 2016 and was on both after delivery for a while. Patient states she has used both vial/syringe and insulin pens in the past. She prefers to use insulin pens. Patient states that she does not have a glucometer of her own but she checks her glucose on her sister's glucometer at times if she thinks her glucose is high. Reviewed glucose and A1C goals. Discussed current A1C of 15.3% on 07/31/21 indicating an average glucose of 392 mg/dl over the past 2-3 months.  Explained that she will definitely need to be on insulin for DM control. Patient is agreeable to take insulin outpatient. She has used basal, meal coverage, and correction insulin in the past. Informed patient that it would be requested that she get Rx for insulin pens, pen needles, and glucose monitoring kit at discharge. Patient verbalized understanding of information discussed and states she has no questions at this time.  Thanks, Barnie Alderman, RN, MSN, CDE Diabetes Coordinator Inpatient Diabetes Program [phone] (Team Pager from 8am to 5pm)

## 2021-08-01 NOTE — Plan of Care (Signed)

## 2021-08-01 NOTE — Progress Notes (Signed)
°   08/01/21 0010  Assess: MEWS Score  Temp 98.6 F (37 C)  BP 117/74  Pulse Rate (!) 115  Resp 18  SpO2 98 %  O2 Device Room Air  Assess: MEWS Score  MEWS Temp 0  MEWS Systolic 0  MEWS Pulse 2  MEWS RR 0  MEWS LOC 0  MEWS Score 2  MEWS Score Color Yellow  Assess: if the MEWS score is Yellow or Red  Were vital signs taken at a resting state? Yes  Focused Assessment No change from prior assessment  Does the patient meet 2 or more of the SIRS criteria? No  MEWS guidelines implemented *See Row Information* No, previously yellow, continue vital signs every 4 hours  Treat  Pain Scale 0-10  Pain Score 0  Assess: SIRS CRITERIA  SIRS Temperature  0  SIRS Pulse 1  SIRS Respirations  0  SIRS WBC 0  SIRS Score Sum  1

## 2021-08-01 NOTE — Progress Notes (Signed)
PHARMACIST - PHYSICIAN COMMUNICATION  CONCERNING:  Enoxaparin (Lovenox) for DVT Prophylaxis    RECOMMENDATION: Patient was prescribed enoxaprin 40mg  q24 hours for VTE prophylaxis.   Filed Weights   07/30/21 1320 07/30/21 2013 07/31/21 1727  Weight: 45.4 kg (100 lb) 41.3 kg (91 lb 0.8 oz) 44.2 kg (97 lb 6.4 oz)    Body mass index is 19.02 kg/m.  Estimated Creatinine Clearance: 71.1 mL/min (A) (by C-G formula based on SCr of 0.38 mg/dL (L)).  Patient is candidate for enoxaparin 30mg  every 24 hours based on CrCl <67ml/min or Weight <45kg  DESCRIPTION: Pharmacy has adjusted enoxaparin dose per [facility] policy.  Patient is now receiving enoxaparin 30 mg every 24 hours   31m 08/01/2021 8:53 AM

## 2021-08-01 NOTE — Progress Notes (Signed)
°  Transition of Care [facility]) Screening Note   Patient Details  Name: Chelsea Vazquez Date of Birth: 05/19/1991   Transition [facility]) CM/SW Contact:    Alberteen Sam, LCSW Phone Number: 08/01/2021, 9:39 AM    Transition of Care Department [facility]) has reviewed patient and no TOC needs have been identified at this time. We will continue to monitor patient advancement through interdisciplinary progression rounds. If new patient transition needs arise, please place a TOC consult.  [place], [place]

## 2021-08-02 ENCOUNTER — Other Ambulatory Visit: Payer: Self-pay | Admitting: Internal Medicine

## 2021-08-02 DIAGNOSIS — A419 Sepsis, unspecified organism: Principal | ICD-10-CM

## 2021-08-02 DIAGNOSIS — R6520 Severe sepsis without septic shock: Secondary | ICD-10-CM

## 2021-08-02 DIAGNOSIS — E1110 Type 2 diabetes mellitus with ketoacidosis without coma: Secondary | ICD-10-CM | POA: Diagnosis not present

## 2021-08-02 LAB — GLUCOSE, CAPILLARY
Glucose-Capillary: 197 mg/dL — ABNORMAL HIGH (ref 70–99)
Glucose-Capillary: 67 mg/dL — ABNORMAL LOW (ref 70–99)
Glucose-Capillary: 148 mg/dL — ABNORMAL HIGH (ref 70–99)

## 2021-08-02 LAB — CBC WITH DIFFERENTIAL/PLATELET
Abs Immature Granulocytes: 0.07 10*3/uL (ref 0.00–0.07)
Basophils Absolute: 0.1 10*3/uL (ref 0.0–0.1)
Basophils Relative: 1 %
Eosinophils Absolute: 0.1 10*3/uL (ref 0.0–0.5)
Eosinophils Relative: 1 %
HCT: 29.3 % — ABNORMAL LOW (ref 36.0–46.0)
Hemoglobin: 9 g/dL — ABNORMAL LOW (ref 12.0–15.0)
Immature Granulocytes: 1 %
Lymphocytes Relative: 24 %
Lymphs Abs: 2.7 10*3/uL (ref 0.7–4.0)
MCH: 22.8 pg — ABNORMAL LOW (ref 26.0–34.0)
MCHC: 30.7 g/dL (ref 30.0–36.0)
MCV: 74.4 fL — ABNORMAL LOW (ref 80.0–100.0)
Monocytes Absolute: 1 10*3/uL (ref 0.1–1.0)
Monocytes Relative: 9 %
Neutro Abs: 7.4 10*3/uL (ref 1.7–7.7)
Neutrophils Relative %: 64 %
Platelets: 254 10*3/uL (ref 150–400)
RBC: 3.94 MIL/uL (ref 3.87–5.11)
RDW: 15.8 % — ABNORMAL HIGH (ref 11.5–15.5)
WBC: 11.2 10*3/uL — ABNORMAL HIGH (ref 4.0–10.5)
nRBC: 0 % (ref 0.0–0.2)

## 2021-08-02 LAB — BPAM RBC
Blood Product Expiration Date: 202302032359
ISSUE DATE / TIME: 202301242206
Unit Type and Rh: 600

## 2021-08-02 LAB — BASIC METABOLIC PANEL
Anion gap: 5 (ref 5–15)
BUN: 6 mg/dL (ref 6–20)
CO2: 28 mmol/L (ref 22–32)
Calcium: 8.3 mg/dL — ABNORMAL LOW (ref 8.9–10.3)
Chloride: 102 mmol/L (ref 98–111)
Creatinine, Ser: 0.31 mg/dL — ABNORMAL LOW (ref 0.44–1.00)
GFR, Estimated: >60 mL/min (ref >60)
Glucose, Bld: 238 mg/dL — ABNORMAL HIGH (ref 70–99)
Potassium: 3.7 mmol/L (ref 3.5–5.1)
Sodium: 135 mmol/L (ref 135–145)

## 2021-08-02 LAB — TYPE AND SCREEN
ABO/RH(D): AB POS
Antibody Screen: NEGATIVE
Unit division: 0

## 2021-08-02 LAB — PHOSPHORUS: Phosphorus: 2.8 mg/dL (ref 2.5–4.6)

## 2021-08-02 LAB — MAGNESIUM: Magnesium: 1.8 mg/dL (ref 1.7–2.4)

## 2021-08-02 MED ORDER — FERROUS SULFATE 325 (65 FE) MG PO TABS: 325.0000 mg | ORAL_TABLET | ORAL | 0 refills | Status: AC

## 2021-08-02 MED ORDER — INSULIN PEN NEEDLE 30G X 8 MM MISC: 1.0000 | 0 refills | Status: AC | PRN

## 2021-08-02 MED ORDER — BLOOD GLUCOSE MONITOR KIT: PACK | 0 refills | Status: AC

## 2021-08-02 MED ORDER — CEPHALEXIN 500 MG PO CAPS: 500.0000 mg | ORAL_CAPSULE | Freq: Four times a day (QID) | ORAL | 0 refills | Status: AC

## 2021-08-02 MED ORDER — INSULIN GLARGINE 100 UNIT/ML SOLOSTAR PEN
12.0000 [IU] | PEN_INJECTOR | Freq: Every day | SUBCUTANEOUS | 0 refills | Status: DC
Start: 2021-08-02 — End: 2021-09-12

## 2021-08-02 NOTE — Progress Notes (Signed)
Patient's current blood sugar is 67. Insulin not administered. Patient stated they did not eat breakfast this morning.   Orange juice offered. Will monitor and check glucose again before discharge.

## 2021-08-02 NOTE — Progress Notes (Signed)
Patient remained stable throughout the shift, and is alert and oriented this morning.   Patient received 1 unit packed red blood cells last night and hemoglobin is currently 9.0 from previous draw of 6.2 pre-transfusion.

## 2021-08-02 NOTE — Discharge Summary (Addendum)
Physician Discharge Summary  Chelsea Vazquez JHE:[id number] DOB: [birth year] DOA: 07/30/2021  PCP: [facility] [place] date: 07/30/2021 Discharge date: 08/02/2021  Discharge disposition: Home   Recommendations for Outpatient Follow-Up:   Follow-up with PCP in 1 week   Discharge Diagnosis:   Principal Problem:   DKA (diabetic ketoacidosis) ([place]) Active Problems:   Severe sepsis ([place])   Hyperglycemia due to diabetes mellitus ([place])   Leukocytosis   Thrombocytosis   Hyponatremia   Microcytic anemia   Dehydration    Discharge Condition: Stable.  Diet recommendation:  Diet Order             Diet - low sodium heart healthy           Diet Carb Modified           Diet Carb Modified Fluid consistency: Thin; Room service appropriate? Yes  Diet effective now                     Code Status: Full Code     Hospital Course:   [name] is a 31 y.o. female with medical history significant for type II DM, depression, anxiety, who presented to the hospital with nausea and vomiting.    She was found to have DKA and severe sepsis secondary to UTI.  She was treated with IV fluids, IV antibiotics and IV insulin infusion per DKA protocol.  She was transitioned to long-acting insulin at discharge because of elevated hemoglobin A1c (15.3).  She was educated on the management of diabetes and its complications, and the importance of medical adherence.  She had hypokalemia, hypophosphatemia and hyponatremia that improved with treatment.  She was transfused with 1 unit of packed red blood cells for acute on chronic anemia.  She does not report any rectal bleeding, hematemesis or excessive emesis.  She was discharged on ferrous sulfate.  Follow-up with PCP for further evaluation was recommended.  Her condition has improved.  She is deemed stable for discharge to home today.   Medical Consultants:   Intensivist   Discharge Exam:     Vitals:   08/02/21 0112 08/02/21 0300 08/02/21 0809 08/02/21 1152  BP: 111/79 101/74 (!) 121/93 119/79  Pulse: 100 95 96 100  Resp: 16 16    Temp: 98.3 F (36.8 C) 97.7 F (36.5 C) 98.3 F (36.8 C) 98.1 F (36.7 C)  TempSrc: Oral Oral  Oral  SpO2: 97% 98% 99% 99%  Weight:      Height:         GEN: NAD SKIN: Warm and dry EYES: No pallor or icterus ENT: MMM CV: RRR PULM: CTA B ABD: soft, ND, NT, +BS CNS: AAO x 3, non focal EXT: No edema or tenderness   The results of significant diagnostics from this hospitalization (including imaging, microbiology, ancillary and laboratory) are listed below for reference.     Procedures and Diagnostic Studies:   DG Chest Port 1 View  Result Date: 07/30/2021 CLINICAL DATA:  Central line EXAM: PORTABLE CHEST 1 VIEW COMPARISON:  07/30/2021 FINDINGS: Right IJ central venous catheter tip at the cavoatrial region. No visible right pneumothorax. No focal airspace disease, pleural effusion or pneumothorax. Normal cardiac size IMPRESSION: Right IJ central venous catheter tip at the cavoatrial junction. No pneumothorax Electronically Signed   By: Donavan Foil M.D.   On: 07/30/2021 22:08   DG Chest Port 1 View  Result Date: 07/30/2021 CLINICAL DATA:  Questionable sepsis.  Altered mental status. Nausea and vomiting for 2 days. EXAM: PORTABLE CHEST 1 VIEW COMPARISON:  10/12/2019 FINDINGS: The heart size and mediastinal contours are within normal limits. Both lungs are clear. The visualized skeletal structures are unremarkable. IMPRESSION: No active disease. Electronically Signed   By: Kerby Moors M.D.   On: 07/30/2021 14:02     Labs:   Basic Metabolic Panel: Recent Labs  Lab 07/30/21 2031 07/30/21 2349 07/31/21 0410 08/01/21 0605 08/02/21 0246  NA 135 140 141 138 135  K 4.1 3.1* 3.2* 3.6 3.7  CL 101 111 115* 106 102  CO2 16* 19* _0 GLUCOSE 558* 334* 239* 180* 238*  BUN 44* 37* 30* 11 6  CREATININE 0.96 0.69 0.44 0.38*  0.31*  CALCIUM 9.1 8.5* 7.7* 8.2* 8.3*  MG  --   --  1.7 1.8 1.8  PHOS  --   --  1.6* 2.1* 2.8   GFR Estimated Creatinine Clearance: 71.1 mL/min (A) (by C-G formula based on SCr of 0.31 mg/dL (L)). Liver Function Tests: Recent Labs  Lab 07/30/21 1511 07/31/21 0410  AST 15 12*  ALT 15 9  ALKPHOS 85 49  BILITOT 1.1 0.3  PROT 8.7* 5.3*  ALBUMIN 3.8 2.3*   Recent Labs  Lab 07/30/21 2031  LIPASE 22   No results for input(s): AMMONIA in the last 168 hours. Coagulation profile Recent Labs  Lab 07/30/21 1511  INR 1.0    CBC: Recent Labs  Lab 07/30/21 1330 07/31/21 0620 08/01/21 0605 08/02/21 0246  WBC 35.2* 21.6* 13.3* 11.2*  NEUTROABS 31.7*  --  10.1* 7.4  HGB 11.8* 7.5* 6.2* 9.0*  HCT 39.6 24.4* 20.3* 29.3*  MCV 74.3* 73.9* 74.6* 74.4*  PLT 548* 293 212 254   Cardiac Enzymes: No results for input(s): CKTOTAL, CKMB, CKMBINDEX, TROPONINI in the last 168 hours. BNP: Invalid input(s): POCBNP CBG: Recent Labs  Lab 08/01/21 1620 08/01/21 2010 08/02/21 0810 08/02/21 1153 08/02/21 1243  GLUCAP 88 128* 197* 67* 148*   D-Dimer No results for input(s): DDIMER in the last 72 hours. Hgb A1c Recent Labs    07/31/21 0441  HGBA1C 15.3*   Lipid Profile No results for input(s): CHOL, HDL, LDLCALC, TRIG, CHOLHDL, LDLDIRECT in the last 72 hours. Thyroid function studies No results for input(s): TSH, T4TOTAL, T3FREE, THYROIDAB in the last 72 hours.  Invalid input(s): FREET3 Anemia work up [employer]    07/31/21 0410 08/01/21 0605 08/01/21 0946  VITAMINB12  --   --  337  FOLATE  --  9.1  --   FERRITIN 13  --   --   TIBC 284  --   --   IRON 14*  --   --    Microbiology Recent Results (from the past 240 hour(s))  Blood Culture (routine x 2)     Status: None (Preliminary result)   Collection Time: 07/30/21  1:30 PM   Specimen: BLOOD  Result Value Ref Range Status   Specimen Description BLOOD RIGHT ANTECUBITAL  Final   Special Requests   Final    BOTTLES  DRAWN AEROBIC AND ANAEROBIC Blood Culture adequate volume   Culture   Final    NO GROWTH 2 DAYS Performed at [facility], [address] [place], [place] [address]    Report Status PENDING  Incomplete  Urine Culture     Status: Abnormal   Collection Time: 07/30/21  7:48 PM   Specimen: In/Out Cath Urine  Result Value Ref Range Status  Specimen Description   Final    IN/OUT CATH URINE Performed at [facility], [address], [place], [place] [address]    Special Requests   Final    NONE Performed at [facility], [address] [place], [place] [address]    Culture (A)  Final    >=100,000 COLONIES/mL LACTOBACILLUS SPECIES Standardized susceptibility testing for this organism is not available. Performed at [facility], [place] [address] [place], [place] [address]    Report Status 08/01/2021 FINAL  Final  Respiratory (~20 pathogens) panel by PCR     Status: None   Collection Time: 07/30/21  7:48 PM   Specimen: Nasopharyngeal Swab; Respiratory  Result Value Ref Range Status   Adenovirus NOT DETECTED NOT DETECTED Final   Coronavirus 229E NOT DETECTED NOT DETECTED Final    Comment: (NOTE) The Coronavirus on the Respiratory Panel, DOES NOT test for the novel  Coronavirus (2019 nCoV)    Coronavirus HKU1 NOT DETECTED NOT DETECTED Final   Coronavirus NL63 NOT DETECTED NOT DETECTED Final   Coronavirus OC43 NOT DETECTED NOT DETECTED Final   Metapneumovirus NOT DETECTED NOT DETECTED Final   Rhinovirus / Enterovirus NOT DETECTED NOT DETECTED Final   Influenza A NOT DETECTED NOT DETECTED Final   Influenza B NOT DETECTED NOT DETECTED Final   Parainfluenza Virus 1 NOT DETECTED NOT DETECTED Final   Parainfluenza Virus 2 NOT DETECTED NOT DETECTED Final   Parainfluenza Virus 3 NOT DETECTED NOT DETECTED Final   Parainfluenza Virus 4 NOT DETECTED NOT DETECTED Final   Respiratory Syncytial Virus NOT DETECTED NOT DETECTED Final   Bordetella pertussis NOT  DETECTED NOT DETECTED Final   Bordetella Parapertussis NOT DETECTED NOT DETECTED Final   Chlamydophila pneumoniae NOT DETECTED NOT DETECTED Final   Mycoplasma pneumoniae NOT DETECTED NOT DETECTED Final    Comment: Performed at [facility], [place]. [address], [place], [place] [address]  Resp Panel by RT-PCR (Flu A&B, Covid) Nasopharyngeal Swab     Status: None   Collection Time: 07/30/21  7:48 PM   Specimen: Nasopharyngeal Swab; Nasopharyngeal(NP) swabs in vial transport medium  Result Value Ref Range Status   SARS Coronavirus 2 by RT PCR NEGATIVE NEGATIVE Final    Comment: (NOTE) SARS-CoV-2 target nucleic acids are NOT DETECTED.  The SARS-CoV-2 RNA is generally detectable in upper respiratory specimens during the acute phase of infection. The lowest concentration of SARS-CoV-2 viral copies this assay can detect is 138 copies/mL. A negative result does not preclude SARS-Cov-2 infection and should not be used as the sole basis for treatment or other patient management decisions. A negative result may occur with  improper specimen collection/handling, submission of specimen other than nasopharyngeal swab, presence of viral mutation(s) within the areas targeted by this assay, and inadequate number of viral copies(<138 copies/mL). A negative result must be combined with clinical observations, patient history, and epidemiological information. The expected result is Negative.  Fact Sheet for Patients:  EntrepreneurPulse.com.au  Fact Sheet for Healthcare Providers:  IncredibleEmployment.be  This test is no t yet approved or cleared by the [place] FDA and  has been authorized for detection and/or diagnosis of SARS-CoV-2 by FDA under an Emergency Use Authorization (EUA). This EUA will remain  in effect (meaning this test can be used) for the duration of the COVID-19 declaration under Section 564(b)(1) of the Act, 21 U.S.C.section 360bbb-3(b)(1),  unless the authorization is terminated  or revoked sooner.       Influenza A  by PCR NEGATIVE NEGATIVE Final  ° Influenza B by PCR NEGATIVE NEGATIVE Final  °  Comment: (NOTE) °The Xpert Xpress SARS-CoV-2/FLU/RSV plus assay is intended as an aid °in the diagnosis of influenza from Nasopharyngeal swab specimens and °should not be used as a sole basis for treatment. Nasal washings and °aspirates are unacceptable for Xpert Xpress SARS-CoV-2/FLU/RSV °testing. ° °Fact Sheet for Patients: °https://www.fda.gov/media/152166/download ° °Fact Sheet for Healthcare Providers: °https://www.fda.gov/media/152162/download ° °This test is not yet approved or cleared by the [place] FDA and °has been authorized for detection and/or diagnosis of SARS-CoV-2 by °FDA under an Emergency Use Authorization (EUA). This EUA will remain °in effect (meaning this test can be used) for the duration of the °COVID-19 declaration under Section 564(b)(1) of the Act, 21 U.S.C. °section 360bbb-3(b)(1), unless the authorization is terminated or °revoked. ° °Performed at [facility], [address] [place], °[place] [address] °  °MRSA Next Gen by PCR, Nasal     Status: None  ° Collection Time: 07/30/21  7:54 PM  ° Specimen: Nasal Mucosa; Nasal Swab  °Result Value Ref Range Status  ° MRSA by PCR Next Gen NOT DETECTED NOT DETECTED Final  °  Comment: (NOTE) °The GeneXpert MRSA Assay (FDA approved for NASAL specimens only), °is one component of a comprehensive MRSA colonization surveillance °program. It is not intended to diagnose MRSA infection nor to guide °or monitor treatment for MRSA infections. °Test performance is not FDA approved in patients less than 2 years °old. °Performed at [facility], [address] [place], °[place] [address] °  °Blood Culture (routine x 2)     Status: None (Preliminary result)  ° Collection Time: 07/30/21  8:31 PM  ° Specimen: BLOOD  °Result Value Ref Range Status  ° Specimen Description BLOOD  LEFT ANTECUBITAL  Final  ° Special Requests IN PEDIATRIC BOTTLE Blood Culture adequate volume  Final  ° Culture   Final  °  NO GROWTH 2 DAYS °Performed at [facility], [address] [place], [place] [address] °  ° Report Status PENDING  Incomplete  ° ° ° °Discharge Instructions:  ° °Discharge Instructions   ° ° Diet - low sodium heart healthy   Complete by: As directed °  ° Diet Carb Modified   Complete by: As directed °  ° Increase activity slowly   Complete by: As directed °  ° °  ° °Allergies as of 08/02/2021   ° °   Reactions  ° Amoxicillin Swelling  ° Naproxen   ° Throat swelling/itch  ° Tylenol [acetaminophen] Swelling  ° °  ° °  °Medication List  °  ° °STOP taking these medications   ° °ALPRAZolam 0.25 MG tablet °Commonly known as: XANAX °  °dicyclomine 10 MG capsule °Commonly known as: Bentyl °  °gabapentin 300 MG capsule °Commonly known as: Neurontin °  °HYDROcodone-acetaminophen 5-325 MG tablet °Commonly known as: NORCO/VICODIN °  °ibuprofen 800 MG tablet °Commonly known as: ADVIL °  °lactulose 10 GM/15ML solution °Commonly known as: CHRONULAC °  °Latuda 60 MG Tabs °Generic drug: Lurasidone HCl °  °methocarbamol 500 MG tablet °Commonly known as: Robaxin °  °ondansetron 4 MG disintegrating tablet °Commonly known as: Zofran ODT °  °predniSONE 50 MG tablet °Commonly known as: DELTASONE °  °QUEtiapine 200 MG tablet °Commonly known as: SEROQUEL °  ° °  ° °TAKE these medications   ° °blood glucose meter kit and supplies Kit °Dispense based on patient and insurance preference. Use up   to four times daily as directed. °  °cephALEXin 500 MG capsule °Commonly known as: KEFLEX °Take 1 capsule (500 mg total) by mouth 4 (four) times daily for 4 days. °  °insulin glargine 100 UNIT/ML Solostar Pen °Commonly known as: LANTUS °Inject 12 Units into the skin daily. °  °Insulin Pen Needle 30G X 8 MM Misc °Commonly known as: NOVOFINE °Inject 10 each into the skin as needed. °  °metFORMIN 850 MG tablet °Commonly  known as: Glucophage °Take 1 tablet (850 mg total) by mouth 2 (two) times daily with a meal. °  ° °  ° ° ° ° ° °If you experience worsening of your admission symptoms, develop shortness of breath, life threatening emergency, suicidal or homicidal thoughts you must seek medical attention immediately by calling 911 or calling your MD immediately  if symptoms less severe. °  °You must read complete instructions/literature along with all the possible adverse reactions/side effects for all the medicines you take and that have been prescribed to you. Take any new medicines after you have completely understood and accept all the possible adverse reactions/side effects.  °  °Please note °  °You were cared for by a hospitalist during your hospital stay. If you have any questions about your discharge medications or the care you received while you were in the hospital after you are discharged, you can call the unit and asked to speak with the hospitalist on call if the hospitalist that took care of you is not available. Once you are discharged, your primary care physician will handle any further medical issues. Please note that NO REFILLS for any discharge medications will be authorized once you are discharged, as it is imperative that you return to your primary care physician (or establish a relationship with a primary care physician if you do not have one) for your aftercare needs so that they can reassess your need for medications and monitor your lab values. °  °  ° ° °Time coordinating discharge: 32 minutes ° °Signed: ° °BERNARD AYIKU  °[facility] °08/02/2021, 5:20 PM ° ° °Pager on [email or web address]. If 7PM-7AM, please contact night-coverage at [email or web address] ° ° °

## 2021-08-02 NOTE — Progress Notes (Signed)
Blood sugar rechecked. 148 at 1:25pm

## 2021-08-04 LAB — CULTURE, BLOOD (ROUTINE X 2)
Culture: NO GROWTH
Culture: NO GROWTH
Special Requests: ADEQUATE
Special Requests: ADEQUATE

## 2021-09-04 ENCOUNTER — Emergency Department
Admission: EM | Admit: 2021-09-04 | Discharge: 2021-09-04 | Disposition: A | Discharge status: Home / Self Care | Payer: Medicaid Other | Attending: Emergency Medicine | Admitting: Emergency Medicine

## 2021-09-04 ENCOUNTER — Other Ambulatory Visit: Payer: Self-pay

## 2021-09-04 DIAGNOSIS — R1030 Lower abdominal pain, unspecified: Secondary | ICD-10-CM | POA: Insufficient documentation

## 2021-09-04 DIAGNOSIS — D72829 Elevated white blood cell count, unspecified: Secondary | ICD-10-CM | POA: Diagnosis not present

## 2021-09-04 DIAGNOSIS — E1165 Type 2 diabetes mellitus with hyperglycemia: Secondary | ICD-10-CM | POA: Diagnosis present

## 2021-09-04 DIAGNOSIS — R8271 Bacteriuria: Secondary | ICD-10-CM | POA: Insufficient documentation

## 2021-09-04 DIAGNOSIS — N390 Urinary tract infection, site not specified: Secondary | ICD-10-CM | POA: Diagnosis not present

## 2021-09-04 DIAGNOSIS — R739 Hyperglycemia, unspecified: Secondary | ICD-10-CM

## 2021-09-04 LAB — URINALYSIS, ROUTINE W REFLEX MICROSCOPIC
Bilirubin Urine: NEGATIVE
Glucose, UA: >=500 mg/dL — AB
Hgb urine dipstick: NEGATIVE
Ketones, ur: 5 mg/dL — AB
Nitrite: NEGATIVE
Protein, ur: NEGATIVE mg/dL
Specific Gravity, Urine: 1.017 (ref 1.005–1.030)
pH: 6 (ref 5.0–8.0)

## 2021-09-04 LAB — BASIC METABOLIC PANEL
Anion gap: 7 (ref 5–15)
BUN: 6 mg/dL (ref 6–20)
CO2: 23 mmol/L (ref 22–32)
Calcium: 8.6 mg/dL — ABNORMAL LOW (ref 8.9–10.3)
Chloride: 102 mmol/L (ref 98–111)
Creatinine, Ser: 0.41 mg/dL — ABNORMAL LOW (ref 0.44–1.00)
GFR, Estimated: >60 mL/min (ref >60)
Glucose, Bld: 380 mg/dL — ABNORMAL HIGH (ref 70–99)
Potassium: 3.7 mmol/L (ref 3.5–5.1)
Sodium: 132 mmol/L — ABNORMAL LOW (ref 135–145)

## 2021-09-04 LAB — CBG MONITORING, ED
Glucose-Capillary: 354 mg/dL — ABNORMAL HIGH (ref 70–99)
Glucose-Capillary: 241 mg/dL — ABNORMAL HIGH (ref 70–99)

## 2021-09-04 LAB — CBC
HCT: 35.8 % — ABNORMAL LOW (ref 36.0–46.0)
Hemoglobin: 10.8 g/dL — ABNORMAL LOW (ref 12.0–15.0)
MCH: 22.7 pg — ABNORMAL LOW (ref 26.0–34.0)
MCHC: 30.2 g/dL (ref 30.0–36.0)
MCV: 75.4 fL — ABNORMAL LOW (ref 80.0–100.0)
Platelets: 377 10*3/uL (ref 150–400)
RBC: 4.75 MIL/uL (ref 3.87–5.11)
RDW: 15.9 % — ABNORMAL HIGH (ref 11.5–15.5)
WBC: 8.2 10*3/uL (ref 4.0–10.5)
nRBC: 0 % (ref 0.0–0.2)

## 2021-09-04 LAB — POC URINE PREG, ED: Preg Test, Ur: NEGATIVE

## 2021-09-04 MED ORDER — FENTANYL CITRATE PF 50 MCG/ML IJ SOSY
50.0000 ug | PREFILLED_SYRINGE | Freq: Once | INTRAMUSCULAR | Status: AC
Start: 2021-09-04 — End: 2021-09-04
  Administered 2021-09-04: 50 ug via INTRAVENOUS
  Filled 2021-09-04: qty 1

## 2021-09-04 MED ORDER — LEVOFLOXACIN IN D5W 500 MG/100ML IV SOLN
500.0000 mg | Freq: Once | INTRAVENOUS | Status: AC
Start: 2021-09-04 — End: 2021-09-04
  Administered 2021-09-04: 500 mg via INTRAVENOUS
  Filled 2021-09-04: qty 100

## 2021-09-04 MED ORDER — INSULIN ASPART 100 UNIT/ML IJ SOLN
8.0000 [IU] | Freq: Once | INTRAMUSCULAR | Status: AC
  Administered 2021-09-04: 8 [IU] via INTRAVENOUS
  Filled 2021-09-04: qty 1

## 2021-09-04 MED ORDER — LEVOFLOXACIN 500 MG PO TABS: 500.0000 mg | ORAL_TABLET | Freq: Every day | ORAL | 0 refills | Status: DC

## 2021-09-04 MED ORDER — SODIUM CHLORIDE 0.9 % IV BOLUS
1000.0000 mL | Freq: Once | INTRAVENOUS | Status: AC
  Administered 2021-09-04: 1000 mL via INTRAVENOUS

## 2021-09-04 MED ORDER — OXYCODONE HCL 5 MG PO TABS
5.0000 mg | ORAL_TABLET | ORAL | 0 refills | Status: DC | PRN
Start: 2021-09-04 — End: 2021-09-10

## 2021-09-04 NOTE — Discharge Instructions (Addendum)
As discussed please take antibiotic as prescribed.  Please continue to take your insulin as prescribed by your doctor at home.  Check your blood sugar at least 3 times daily before meals.  Please follow-up with your doctor within the next 1 week for recheck/reevaluation and to discuss your blood sugar regimen further.  Return to the emergency department for any symptom personally concerning to yourself.  Please continue to drink plenty of fluids.

## 2021-09-04 NOTE — ED Provider Notes (Signed)
[facility] Provider Note    Event Date/Time   First MD Initiated Contact with Patient 09/04/21 1211     (approximate)  History   Chief Complaint: Hyperglycemia  HPI  EDITHE DOBBIN is a 31 y.o. female with a past medical history of diabetes, presents to the emergency department for dizziness and hyperglycemia.  According to the patient for the past 2 days or so she has been feeling dizzy with nausea and occasional episodes of vomiting.  Patient is diabetic and is prescribed insulin but states is not working and her blood sugars been elevated to 3 and 400.  Patient states she was recently admitted to the hospital for the same and discharged after being diagnosed with an infection but the patient does not know of what.  I reviewed the patient's recent discharge summary 08/02/2021, patient at that time was admitted for DKA and sepsis secondary to urinary tract infection.  Also received a blood transfusion at that time due to acute on chronic anemia.  Physical Exam   Triage Vital Signs: ED Triage Vitals  Enc Vitals Group     BP 09/04/21 1049 (!) 127/95     Pulse Rate 09/04/21 1049 (!) 113     Resp 09/04/21 1049 18     Temp 09/04/21 1049 98 F (36.7 C)     Temp src --      SpO2 09/04/21 1049 98 %     Weight 09/04/21 1050 97 lb 7.1 oz (44.2 kg)     Height 09/04/21 1050 5' (1.524 m)     Head Circumference --      Peak Flow --      Pain Score --      Pain Loc --      Pain Edu? --      Excl. in GC? --     Most recent vital signs: Vitals:   09/04/21 1049  BP: (!) 127/95  Pulse: (!) 113  Resp: 18  Temp: 98 F (36.7 C)  SpO2: 98%    General: Awake, no distress.  CV:  Good peripheral perfusion.  Regular rate and rhythm around 100 bpm. Resp:  Normal effort.  Equal breath sounds bilaterally.  Abd:  No distention.  Soft, mild suprapubic tenderness otherwise benign abdomen. Other:  Somewhat dry appearing mucous membranes.   ED Results / Procedures /  Treatments   MEDICATIONS ORDERED IN ED: Medications  levofloxacin (LEVAQUIN) IVPB 500 mg (500 mg Intravenous New Bag/Given 09/04/21 1230)  sodium chloride 0.9 % bolus 1,000 mL (0 mLs Intravenous Stopped 09/04/21 1228)  insulin aspart (novoLOG) injection 8 Units (8 Units Intravenous Given 09/04/21 1230)  sodium chloride 0.9 % bolus 1,000 mL (1,000 mLs Intravenous New Bag/Given 09/04/21 1230)     IMPRESSION / MDM / ASSESSMENT AND PLAN / ED COURSE  I reviewed the triage vital signs and the nursing notes.  Patient presents emergency department for nausea dizziness and hyperglycemia.  Patient's work-up in the emergency department shows a reassuringly normal anion gap, blood glucose of 380 but normal renal function.  We will dose 8 units of IV insulin.  Patient CBC largely nonrevealing mild anemia.  Patient's urinalysis once again shows many bacteria with white blood cells.  We will send urine culture and treat with IV Levaquin as the patient has a penicillin allergy.  Patient does have mild suprapubic tenderness.  Denies any vaginal discharge.  Appears to continue to have a urinary tract infection the culture has been sent.  Patient's chemistry has resulted in showing a normal anion gap of 7.  Blood glucose is down to 241.  Patient is finishing her fluids has received IV antibiotics.  We will discharge home with oral Levaquin.  Patient CBC is normal with a normal white blood cell count.  Pregnancy test is negative.  Discussed this plan of care with the patient who is agreeable.  FINAL CLINICAL IMPRESSION(S) / ED DIAGNOSES   Hyperglycemia Urinary tract infection  Note:  This document was prepared using Dragon voice recognition software and may include unintentional dictation errors.   Minna Antis, MD 09/04/21 1409

## 2021-09-04 NOTE — ED Triage Notes (Signed)
CBG of 565 at home, recent admission for same. Reports left side tingling for 2 days, since sugar has been high. Nausea and vomiting X 2 days. Unsure if type 1 of 2 diabetic. Pt alert and oriented X4, cooperative, RR even and unlabored, color WNL. Pt in NAD.

## 2021-09-08 ENCOUNTER — Other Ambulatory Visit: Payer: Self-pay

## 2021-09-08 ENCOUNTER — Inpatient Hospital Stay
Admission: EM | Admit: 2021-09-08 | Discharge: 2021-09-12 | DRG: 690 | Disposition: A | Discharge status: Home / Self Care | Payer: Medicaid Other | Attending: Internal Medicine | Admitting: Internal Medicine

## 2021-09-08 DIAGNOSIS — K209 Esophagitis, unspecified without bleeding: Secondary | ICD-10-CM

## 2021-09-08 DIAGNOSIS — E1065 Type 1 diabetes mellitus with hyperglycemia: Secondary | ICD-10-CM | POA: Diagnosis present

## 2021-09-08 DIAGNOSIS — Z833 Family history of diabetes mellitus: Secondary | ICD-10-CM

## 2021-09-08 DIAGNOSIS — R112 Nausea with vomiting, unspecified: Secondary | ICD-10-CM

## 2021-09-08 DIAGNOSIS — Z7984 Long term (current) use of oral hypoglycemic drugs: Secondary | ICD-10-CM

## 2021-09-08 DIAGNOSIS — N12 Tubulo-interstitial nephritis, not specified as acute or chronic: Secondary | ICD-10-CM

## 2021-09-08 DIAGNOSIS — R339 Retention of urine, unspecified: Secondary | ICD-10-CM

## 2021-09-08 DIAGNOSIS — R748 Abnormal levels of other serum enzymes: Secondary | ICD-10-CM

## 2021-09-08 DIAGNOSIS — Z79899 Other long term (current) drug therapy: Secondary | ICD-10-CM

## 2021-09-08 DIAGNOSIS — F32A Depression, unspecified: Secondary | ICD-10-CM | POA: Diagnosis present

## 2021-09-08 DIAGNOSIS — Z8249 Family history of ischemic heart disease and other diseases of the circulatory system: Secondary | ICD-10-CM

## 2021-09-08 DIAGNOSIS — R739 Hyperglycemia, unspecified: Secondary | ICD-10-CM

## 2021-09-08 DIAGNOSIS — E109 Type 1 diabetes mellitus without complications: Secondary | ICD-10-CM | POA: Insufficient documentation

## 2021-09-08 DIAGNOSIS — N390 Urinary tract infection, site not specified: Secondary | ICD-10-CM | POA: Insufficient documentation

## 2021-09-08 DIAGNOSIS — R338 Other retention of urine: Secondary | ICD-10-CM

## 2021-09-08 DIAGNOSIS — D509 Iron deficiency anemia, unspecified: Secondary | ICD-10-CM | POA: Diagnosis present

## 2021-09-08 DIAGNOSIS — N1330 Unspecified hydronephrosis: Secondary | ICD-10-CM | POA: Insufficient documentation

## 2021-09-08 DIAGNOSIS — N83202 Unspecified ovarian cyst, left side: Secondary | ICD-10-CM

## 2021-09-08 DIAGNOSIS — Z832 Family history of diseases of the blood and blood-forming organs and certain disorders involving the immune mechanism: Secondary | ICD-10-CM

## 2021-09-08 DIAGNOSIS — Z20822 Contact with and (suspected) exposure to covid-19: Secondary | ICD-10-CM | POA: Diagnosis present

## 2021-09-08 DIAGNOSIS — Z794 Long term (current) use of insulin: Secondary | ICD-10-CM

## 2021-09-08 DIAGNOSIS — N136 Pyonephrosis: Principal | ICD-10-CM | POA: Diagnosis present

## 2021-09-08 LAB — CBC
HCT: 31.2 % — ABNORMAL LOW (ref 36.0–46.0)
Hemoglobin: 9.4 g/dL — ABNORMAL LOW (ref 12.0–15.0)
MCH: 22.8 pg — ABNORMAL LOW (ref 26.0–34.0)
MCHC: 30.1 g/dL (ref 30.0–36.0)
MCV: 75.7 fL — ABNORMAL LOW (ref 80.0–100.0)
Platelets: 307 10*3/uL (ref 150–400)
RBC: 4.12 MIL/uL (ref 3.87–5.11)
RDW: 16 % — ABNORMAL HIGH (ref 11.5–15.5)
WBC: 8.6 10*3/uL (ref 4.0–10.5)
nRBC: 0 % (ref 0.0–0.2)

## 2021-09-08 LAB — BASIC METABOLIC PANEL
Anion gap: 8 (ref 5–15)
BUN: <5 mg/dL — ABNORMAL LOW (ref 6–20)
CO2: 25 mmol/L (ref 22–32)
Calcium: 8.1 mg/dL — ABNORMAL LOW (ref 8.9–10.3)
Chloride: 100 mmol/L (ref 98–111)
Creatinine, Ser: 0.51 mg/dL (ref 0.44–1.00)
GFR, Estimated: >60 mL/min (ref >60)
Glucose, Bld: 424 mg/dL — ABNORMAL HIGH (ref 70–99)
Potassium: 3.4 mmol/L — ABNORMAL LOW (ref 3.5–5.1)
Sodium: 133 mmol/L — ABNORMAL LOW (ref 135–145)

## 2021-09-08 LAB — URINALYSIS, ROUTINE W REFLEX MICROSCOPIC
Bacteria, UA: NONE SEEN
Bilirubin Urine: NEGATIVE
Glucose, UA: >=500 mg/dL — AB
Hgb urine dipstick: NEGATIVE
Ketones, ur: NEGATIVE mg/dL
Nitrite: NEGATIVE
Protein, ur: NEGATIVE mg/dL
Specific Gravity, Urine: 1.02 (ref 1.005–1.030)
pH: 7 (ref 5.0–8.0)

## 2021-09-08 LAB — POC URINE PREG, ED: Preg Test, Ur: NEGATIVE

## 2021-09-08 LAB — CBG MONITORING, ED
Glucose-Capillary: 410 mg/dL — ABNORMAL HIGH (ref 70–99)
Glucose-Capillary: 70 mg/dL (ref 70–99)

## 2021-09-08 MED ORDER — SODIUM CHLORIDE 0.9 % IV BOLUS
1000.0000 mL | Freq: Once | INTRAVENOUS | Status: AC
  Administered 2021-09-08: 1000 mL via INTRAVENOUS

## 2021-09-08 MED ORDER — SODIUM CHLORIDE 0.9 % IV SOLN
1.0000 g | Freq: Once | INTRAVENOUS | Status: AC
  Administered 2021-09-09: 1 g via INTRAVENOUS
  Filled 2021-09-08: qty 10

## 2021-09-08 MED ORDER — GABAPENTIN 300 MG PO CAPS
300.0000 mg | ORAL_CAPSULE | Freq: Once | ORAL | Status: AC
  Administered 2021-09-08: 300 mg via ORAL
  Filled 2021-09-08: qty 1

## 2021-09-08 MED ORDER — SODIUM CHLORIDE 0.9 % IV BOLUS (SEPSIS): 1000.0000 mL | Freq: Once | INTRAVENOUS | Status: DC

## 2021-09-08 MED ORDER — INSULIN ASPART 100 UNIT/ML IJ SOLN
10.0000 [IU] | Freq: Once | INTRAMUSCULAR | Status: AC
  Administered 2021-09-08: 10 [IU] via INTRAVENOUS
  Filled 2021-09-08: qty 1

## 2021-09-08 NOTE — ED Triage Notes (Signed)
New onset Pedal edema, advised to come here by PCP because " it sounds like DKA symptoms." Did not check blood sugar at home. Fatigue, Urinary frequency with nausea and vomiting.  ?

## 2021-09-08 NOTE — ED Provider Notes (Signed)
? ?[facility] ?Provider Note ? ? ? Event Date/Time  ? First MD Initiated Contact with Patient 09/08/21 2208   ?  (approximate) ? ? ?History  ? ?Hyperglycemia ? ? ?HPI ? ?Chelsea Vazquez is a 31 y.o. female  who, per discharge summary dated 08/02/2021 had an admission for dka, presents to the emergency department today at the advice of PCP office because of concern for symptoms they thought were indicative of possible DKA. The patient states that her symptoms have included nausea, urinary frequency, myalgias and fatigue. Additionally the patient states she has noticed some swelling to her feet. She says her blood sugars have run in the 300s this week but go up and down frequently.  ? ?Physical Exam  ? ?Triage Vital Signs: ?ED Triage Vitals  ?Enc Vitals Group  ?   BP 09/08/21 2049 (!) 140/102  ?   Pulse Rate 09/08/21 2049 (!) 114  ?   Resp 09/08/21 2049 18  ?   Temp 09/08/21 2049 (!) 97 ?F (36.1 ?C)  ?   Temp Source 09/08/21 2049 Oral  ?   SpO2 09/08/21 2049 99 %  ?   Weight 09/08/21 2050 100 lb (45.4 kg)  ?   Height 09/08/21 2050 5' (1.524 m)  ?   Head Circumference --   ?   Peak Flow --   ?   Pain Score 09/08/21 2050 0  ? ?Most recent vital signs: ?Vitals:  ? 09/08/21 2150 09/08/21 2155  ?BP:    ?Pulse: (!) 108 (!) 110  ?Resp: (!) 24 (!) 23  ?Temp:    ?SpO2: 100% 100%  ? ? ?General: Awake, no distress.  ?CV:  Good peripheral perfusion. Tachycardia ?Resp:  Normal effort.  ?Abd:  No distention. Minimally tender in the lower abdomen. ?MSK:  Trace edema to bilateral ankles.  ? ? ?ED Results / Procedures / Treatments  ? ?Labs ?(all labs ordered are listed, but only abnormal results are displayed) ?Labs Reviewed  ?BASIC METABOLIC PANEL - Abnormal; Notable for the following components:  ?    Result Value  ? Sodium 133 (*)   ? Potassium 3.4 (*)   ? Glucose, Bld 424 (*)   ? BUN <5 (*)   ? Calcium 8.1 (*)   ? All other components within normal limits  ?CBC - Abnormal; Notable for the following  components:  ? Hemoglobin 9.4 (*)   ? HCT 31.2 (*)   ? MCV 75.7 (*)   ? MCH 22.8 (*)   ? RDW 16.0 (*)   ? All other components within normal limits  ?URINALYSIS, ROUTINE W REFLEX MICROSCOPIC - Abnormal; Notable for the following components:  ? Color, Urine STRAW (*)   ? APPearance HAZY (*)   ? Glucose, UA >=500 (*)   ? Leukocytes,Ua MODERATE (*)   ? All other components within normal limits  ?CBG MONITORING, ED - Abnormal; Notable for the following components:  ? Glucose-Capillary 410 (*)   ? All other components within normal limits  ?POC URINE PREG, ED  ?CBG MONITORING, ED  ?CBG MONITORING, ED  ? ? ? ?EKG ? ?None ? ? ?RADIOLOGY ?None ? ? ?PROCEDURES: ? ?Critical Care performed: No ? ?Procedures ? ? ?MEDICATIONS ORDERED IN ED: ?Medications - No data to display ? ? ?IMPRESSION / MDM / ASSESSMENT AND PLAN / ED COURSE  ?I reviewed the triage vital signs and the nursing notes. ?             ?               ? ?  Differential diagnosis includes, but is not limited to, DKA, hyperglycemia, covid, influenza.  ? ?Patient presented to the emergency department today at the advice of her doctor because of concerns for possible diabetic ketoacidosis.  Fortunately her work-up here is not consistent with DKA with no anion gap or ketones in the urine.  However her blood sugar is certainly elevated.  Additionally she continues to be anemic.  Urine does show some moderate leukocytes however no bacteria and overall I would say does appear somewhat improved from urine checked at recent ER visit.  Additionally squamous epithelial cells make this not a clean catch.  Will however send for urine culture.  At this time will plan on giving IV fluids and insulin to try to bring down the patient's sugar level.  Additionally I offered to check COVID and influenza however the patient declined, stating she was checked for COVID yesterday at her doctor's office and it was negative.  ? ?FINAL CLINICAL IMPRESSION(S) / ED DIAGNOSES  ? ?Final diagnoses:   ?Hyperglycemia  ? ? ?Note:  This document was prepared using Dragon voice recognition software and may include unintentional dictation errors. ? ?  ?Nance Pear, MD ?09/08/21 2247 ? ?

## 2021-09-08 NOTE — ED Provider Notes (Signed)
11:15 PM  Assumed care at shift change.  Patient here with history of diabetes complaining of not feeling well and concerned that she is in DKA.  Labs show normal bicarb, normal anion gap and no ketones in her urine.  She is hyperglycemic.  Getting IV fluids, insulin and will need blood sugar rechecked at midnight.  She declines COVID and flu testing. ? ?1:08 AM Pt's blood sugar dropped to 70 after fluids and insulin.  She has been able to eat and drink and blood sugar improved to 125 but she is now vomiting again.  She states she is having pain and swelling in her face, her legs and diffuse abdominal pain.  She states that she is on an antibiotic and nausea medicine at home and she continues to vomit.  Patient continues to be tachycardic here and there is nonbloody, nonbilious emesis in her emesis bag.  I wonder if there is any component of cannabinoid hyperemesis syndrome.  We will add on a UDS.  Would also be gastroparesis given the recent diagnosis of diabetes.  Will give droperidol, continued IV fluids.  Given her complaints of abdominal pain with UTI, will obtain CT of the abdomen pelvis.  Discussed with her that she may need admission if I am not able to get her vomiting under control.  She now agrees to allow [place] to perform a COVID and flu test in case admission is needed. ? ?2:18 AM  Pt's heart rate now in the low 100s down from the 120s.  Continues to be normotensive and slightly hypertensive.  She states her nausea and pain have improved somewhat after droperidol.  Urine drug screen is negative.  CT of her abdomen pelvis reviewed by myself and radiologist and shows esophagitis, left-sided pyelonephritis, right-sided hydronephrosis and urinary bladder distention.  She states she feels she is emptying her bladder appropriately.  We will have her get up and empty her bladder and then will bladder scan.  We discussed if she has a large amount on her postvoid residual that she will need a Foley catheter.  Will  discuss with hospitalist for admission given she seems to be failing outpatient antibiotics for her UTI. ? ?2:40 AM  Pt's postvoid residual is over 500 mL.  We will place a Foley catheter. ? ?Consulted and discussed patient's case with hospitalist, Dr. Para March.  I have recommended admission and consulting physician agrees and will place admission orders.  Patient (and family if present) agree with this plan.  ? ?I reviewed all nursing notes, vitals, pertinent previous records.  All labs, EKGs, imaging ordered have been independently reviewed and interpreted by myself. ? ?  ?Race Latour, Layla Maw, DO ?09/09/21 0240 ? ?

## 2021-09-08 NOTE — Discharge Instructions (Addendum)
Please seek medical attention for any high fevers, chest pain, shortness of breath, change in behavior, persistent vomiting, bloody stool or any other new or concerning symptoms. ? ? ? ?Endocrinologists with [facility]  ?Phone: [phone] ?1. Dr. Carlena Sax ?2. Dr. Verdis Frederickson ? ? ?Check your sugars atleast three times a day and take Sliding scale insulin accordingly. ?

## 2021-09-09 ENCOUNTER — Emergency Department: Payer: Medicaid Other

## 2021-09-09 ENCOUNTER — Other Ambulatory Visit: Payer: Self-pay

## 2021-09-09 DIAGNOSIS — N136 Pyonephrosis: Secondary | ICD-10-CM | POA: Diagnosis present

## 2021-09-09 DIAGNOSIS — Z20822 Contact with and (suspected) exposure to covid-19: Secondary | ICD-10-CM | POA: Diagnosis present

## 2021-09-09 DIAGNOSIS — N1330 Unspecified hydronephrosis: Secondary | ICD-10-CM | POA: Insufficient documentation

## 2021-09-09 DIAGNOSIS — N10 Acute tubulo-interstitial nephritis: Secondary | ICD-10-CM

## 2021-09-09 DIAGNOSIS — Z832 Family history of diseases of the blood and blood-forming organs and certain disorders involving the immune mechanism: Secondary | ICD-10-CM | POA: Diagnosis not present

## 2021-09-09 DIAGNOSIS — R112 Nausea with vomiting, unspecified: Secondary | ICD-10-CM | POA: Insufficient documentation

## 2021-09-09 DIAGNOSIS — E109 Type 1 diabetes mellitus without complications: Secondary | ICD-10-CM | POA: Diagnosis not present

## 2021-09-09 DIAGNOSIS — D509 Iron deficiency anemia, unspecified: Secondary | ICD-10-CM | POA: Diagnosis present

## 2021-09-09 DIAGNOSIS — Z79899 Other long term (current) drug therapy: Secondary | ICD-10-CM | POA: Diagnosis not present

## 2021-09-09 DIAGNOSIS — Z8249 Family history of ischemic heart disease and other diseases of the circulatory system: Secondary | ICD-10-CM | POA: Diagnosis not present

## 2021-09-09 DIAGNOSIS — Z7984 Long term (current) use of oral hypoglycemic drugs: Secondary | ICD-10-CM | POA: Diagnosis not present

## 2021-09-09 DIAGNOSIS — Z794 Long term (current) use of insulin: Secondary | ICD-10-CM | POA: Diagnosis not present

## 2021-09-09 DIAGNOSIS — R748 Abnormal levels of other serum enzymes: Secondary | ICD-10-CM

## 2021-09-09 DIAGNOSIS — E1065 Type 1 diabetes mellitus with hyperglycemia: Secondary | ICD-10-CM | POA: Diagnosis present

## 2021-09-09 DIAGNOSIS — R338 Other retention of urine: Secondary | ICD-10-CM

## 2021-09-09 DIAGNOSIS — F32A Depression, unspecified: Secondary | ICD-10-CM | POA: Diagnosis present

## 2021-09-09 DIAGNOSIS — R339 Retention of urine, unspecified: Secondary | ICD-10-CM | POA: Diagnosis present

## 2021-09-09 DIAGNOSIS — N3000 Acute cystitis without hematuria: Secondary | ICD-10-CM | POA: Diagnosis not present

## 2021-09-09 DIAGNOSIS — Z833 Family history of diabetes mellitus: Secondary | ICD-10-CM | POA: Diagnosis not present

## 2021-09-09 LAB — CBG MONITORING, ED
Glucose-Capillary: 125 mg/dL — ABNORMAL HIGH (ref 70–99)
Glucose-Capillary: 245 mg/dL — ABNORMAL HIGH (ref 70–99)
Glucose-Capillary: 190 mg/dL — ABNORMAL HIGH (ref 70–99)

## 2021-09-09 LAB — HEPATIC FUNCTION PANEL
ALT: 9 U/L (ref 0–44)
AST: 17 U/L (ref 15–41)
Albumin: 2.7 g/dL — ABNORMAL LOW (ref 3.5–5.0)
Alkaline Phosphatase: 45 U/L (ref 38–126)
Bilirubin, Direct: <0.1 mg/dL (ref 0.0–0.2)
Total Bilirubin: 0.4 mg/dL (ref 0.3–1.2)
Total Protein: 5.9 g/dL — ABNORMAL LOW (ref 6.5–8.1)

## 2021-09-09 LAB — URINE DRUG SCREEN, QUALITATIVE (ARMC ONLY)
Amphetamines, Ur Screen: NOT DETECTED
Barbiturates, Ur Screen: NOT DETECTED
Benzodiazepine, Ur Scrn: NOT DETECTED
Cannabinoid 50 Ng, Ur ~~LOC~~: NOT DETECTED
Cocaine Metabolite,Ur ~~LOC~~: NOT DETECTED
MDMA (Ecstasy)Ur Screen: NOT DETECTED
Methadone Scn, Ur: NOT DETECTED
Opiate, Ur Screen: NOT DETECTED
Phencyclidine (PCP) Ur S: NOT DETECTED
Tricyclic, Ur Screen: NOT DETECTED

## 2021-09-09 LAB — GLUCOSE, CAPILLARY
Glucose-Capillary: 130 mg/dL — ABNORMAL HIGH (ref 70–99)
Glucose-Capillary: 109 mg/dL — ABNORMAL HIGH (ref 70–99)

## 2021-09-09 LAB — RESP PANEL BY RT-PCR (FLU A&B, COVID) ARPGX2
Influenza A by PCR: NEGATIVE
Influenza B by PCR: NEGATIVE
SARS Coronavirus 2 by RT PCR: NEGATIVE

## 2021-09-09 LAB — LIPASE, BLOOD: Lipase: 59 U/L — ABNORMAL HIGH (ref 11–51)

## 2021-09-09 MED ORDER — PROCHLORPERAZINE EDISYLATE 10 MG/2ML IJ SOLN: 10.0000 mg | Freq: Four times a day (QID) | INTRAMUSCULAR | Status: DC | PRN

## 2021-09-09 MED ORDER — ONDANSETRON HCL 4 MG/2ML IJ SOLN: 4.0000 mg | Freq: Four times a day (QID) | INTRAMUSCULAR | Status: DC | PRN

## 2021-09-09 MED ORDER — CEFTRIAXONE SODIUM 1 G IJ SOLR
1.0000 g | INTRAMUSCULAR | Status: DC
  Administered 2021-09-10 – 2021-09-11 (×2): 1 g via INTRAVENOUS
  Filled 2021-09-09 (×3): qty 10

## 2021-09-09 MED ORDER — ENOXAPARIN SODIUM 40 MG/0.4ML IJ SOSY
40.0000 mg | PREFILLED_SYRINGE | INTRAMUSCULAR | Status: DC
  Administered 2021-09-09 – 2021-09-12 (×4): 40 mg via SUBCUTANEOUS
  Filled 2021-09-09 (×4): qty 0.4

## 2021-09-09 MED ORDER — ACETAMINOPHEN 325 MG PO TABS
650.0000 mg | ORAL_TABLET | Freq: Four times a day (QID) | ORAL | Status: DC | PRN
  Administered 2021-09-10 – 2021-09-12 (×3): 650 mg via ORAL
  Filled 2021-09-09 (×3): qty 2

## 2021-09-09 MED ORDER — SODIUM CHLORIDE 0.9 % IV SOLN: INTRAVENOUS | Status: DC

## 2021-09-09 MED ORDER — SODIUM CHLORIDE 0.9 % IV BOLUS (SEPSIS)
1000.0000 mL | Freq: Once | INTRAVENOUS | Status: AC
  Administered 2021-09-09: 1000 mL via INTRAVENOUS

## 2021-09-09 MED ORDER — ONDANSETRON HCL 4 MG/2ML IJ SOLN
INTRAMUSCULAR | Status: AC
  Administered 2021-09-09: 4 mg via INTRAVENOUS
  Filled 2021-09-09: qty 2

## 2021-09-09 MED ORDER — ONDANSETRON HCL 4 MG/2ML IJ SOLN: 4.0000 mg | Freq: Once | INTRAMUSCULAR | Status: AC

## 2021-09-09 MED ORDER — DROPERIDOL 2.5 MG/ML IJ SOLN
2.5000 mg | Freq: Once | INTRAMUSCULAR | Status: AC
  Administered 2021-09-09: 2.5 mg via INTRAVENOUS
  Filled 2021-09-09: qty 2

## 2021-09-09 MED ORDER — INSULIN ASPART 100 UNIT/ML IJ SOLN
0.0000 [IU] | Freq: Every day | INTRAMUSCULAR | Status: DC
  Administered 2021-09-10 – 2021-09-11 (×2): 2 [IU] via SUBCUTANEOUS
  Filled 2021-09-09 (×2): qty 1

## 2021-09-09 MED ORDER — PANTOPRAZOLE SODIUM 40 MG IV SOLR
40.0000 mg | Freq: Once | INTRAVENOUS | Status: AC
Start: 2021-09-09 — End: 2021-09-09
  Administered 2021-09-09: 40 mg via INTRAVENOUS
  Filled 2021-09-09: qty 10

## 2021-09-09 MED ORDER — IOHEXOL 300 MG/ML  SOLN
100.0000 mL | Freq: Once | INTRAMUSCULAR | Status: AC | PRN
  Administered 2021-09-09: 100 mL via INTRAVENOUS

## 2021-09-09 MED ORDER — ONDANSETRON HCL 4 MG PO TABS: 4.0000 mg | ORAL_TABLET | Freq: Four times a day (QID) | ORAL | Status: DC | PRN

## 2021-09-09 MED ORDER — FENTANYL CITRATE PF 50 MCG/ML IJ SOSY
50.0000 ug | PREFILLED_SYRINGE | Freq: Once | INTRAMUSCULAR | Status: AC
  Administered 2021-09-09: 50 ug via INTRAVENOUS
  Filled 2021-09-09: qty 1

## 2021-09-09 MED ORDER — INSULIN ASPART 100 UNIT/ML IJ SOLN
0.0000 [IU] | Freq: Three times a day (TID) | INTRAMUSCULAR | Status: DC
  Administered 2021-09-09: 1 [IU] via SUBCUTANEOUS
  Administered 2021-09-09: 2 [IU] via SUBCUTANEOUS
  Administered 2021-09-09: 3 [IU] via SUBCUTANEOUS
  Administered 2021-09-10: 1 [IU] via SUBCUTANEOUS
  Administered 2021-09-10: 3 [IU] via SUBCUTANEOUS
  Administered 2021-09-10: 1 [IU] via SUBCUTANEOUS
  Administered 2021-09-11: 2 [IU] via SUBCUTANEOUS
  Administered 2021-09-11 – 2021-09-12 (×4): 3 [IU] via SUBCUTANEOUS
  Filled 2021-09-09 (×11): qty 1

## 2021-09-09 MED ORDER — CHLORHEXIDINE GLUCONATE CLOTH 2 % EX PADS
6.0000 | MEDICATED_PAD | Freq: Every day | CUTANEOUS | Status: DC
  Administered 2021-09-10 – 2021-09-12 (×3): 6 via TOPICAL

## 2021-09-09 MED ORDER — METOCLOPRAMIDE HCL 5 MG/ML IJ SOLN
10.0000 mg | Freq: Four times a day (QID) | INTRAMUSCULAR | Status: AC
  Administered 2021-09-09 (×2): 10 mg via INTRAVENOUS
  Filled 2021-09-09 (×2): qty 2

## 2021-09-09 MED ORDER — PANTOPRAZOLE SODIUM 40 MG IV SOLR
40.0000 mg | INTRAVENOUS | Status: DC
  Administered 2021-09-09 – 2021-09-11 (×3): 40 mg via INTRAVENOUS
  Filled 2021-09-09 (×3): qty 10

## 2021-09-09 MED ORDER — ACETAMINOPHEN 650 MG RE SUPP: 650.0000 mg | Freq: Four times a day (QID) | RECTAL | Status: DC | PRN

## 2021-09-09 NOTE — H&P (Signed)
History and Physical    Patient: Chelsea Vazquez EXN:[id number] DOB: Jul 23, 1990 DOA: 09/08/2021 DOS: the patient was seen and examined on 09/09/2021 PCP: [facility]  Patient coming from: Home  Chief Complaint:  Chief Complaint  Patient presents with   Hyperglycemia    HPI: Chelsea Vazquez is a 31 y.o. female with medical history significant of Insulin-dependent type 2 diabetes, depression, recently hospitalized from 1/22 to 1/25 with DKA and severe sepsis secondary to UTI, transfused 1 unit PRBC during that admission for hemoglobin 6.2, who returns to the ED with recurrence of prior symptoms of intractable nausea and vomiting.  She denied abdominal pain, fever or chills.  She did see her PCP several days ago and was placed on oral antibiotics without improvement in symptoms ED course: On arrival temp 97, pulse 107-114, respirations 18-24, BP 140/102, O2 sat of 100% on room air Blood work significant for glucose 424 with normal anion gap and bicarb, WBC WNL, hemoglobin 9.4, down from posttransfusion hemoglobin of 10.8 on 2/27.  Lipase 59, LFTs WNL.  Urinalysis with moderate leukocyte esterase.  UDS clean.  COVID and flu negative CT abdomen and pelvis showing the following: 1. Marked severity diffuse distal esophageal wall thickening which may represent esophagitis. 2. Marked severity right-sided hydronephrosis and proximal hydroureter which may be secondary to marked severity urinary bladder distension. 3. Heterogeneous appearance of the parenchyma of the left kidney which may be seen in acute pyelonephritis. Correlation with urinalysis is recommended. 4. Large, left para adnexal cyst likely ovarian in origin.  Patient was given an IV fluid bolus.  Postvoid residual was 500 mils and Foley catheter ordered.  She was treated with several antiemetics, started on Protonix, and given a one-time IV insulin dose of 10 units, started on IV Rocephin.  Hospitalist  consulted for admission.   Review of Systems: As mentioned in the history of present illness. All other systems reviewed and are negative. Past Medical History:  Diagnosis Date   Depression    GDM (gestational diabetes mellitus)    fourth pregnancy   History of chlamydia 2011   History of IUFD    D/t Abruption HELLP   Migraines    Premature labor 11/29/2011   35 weeks   Pyelonephritis 2011   Past Surgical History:  Procedure Laterality Date   tubal ligation  10/10/2014   [facility]   Social History:  reports that she has never smoked. She has never used smokeless tobacco. She reports that she does not drink alcohol and does not use drugs.  Allergies  Allergen Reactions   Amoxicillin Swelling   Naproxen     Throat swelling/itch   Tylenol [Acetaminophen] Swelling    Family History  Problem Relation Age of Onset   Hypertension Mother    Hypertension Father    Diabetes Paternal Grandmother        Type 1   Sickle cell trait Other     Prior to Admission medications   Medication Sig Start Date End Date Taking? Authorizing Provider  blood glucose meter kit and supplies KIT Dispense based on patient and insurance preference. Use up to four times daily as directed. 08/02/21   Jennye Boroughs, MD  ferrous sulfate 325 (65 FE) MG tablet Take 1 tablet (325 mg total) by mouth every other day. 08/02/21 10/01/21  Jennye Boroughs, MD  insulin glargine (LANTUS) 100 UNIT/ML Solostar Pen Inject 12 Units into the skin daily. 08/02/21 09/01/21  Jennye Boroughs, MD  Insulin Pen Needle (  NOVOFINE) 30G X 8 MM MISC Inject 10 each into the skin as needed. 08/02/21   Jennye Boroughs, MD  levofloxacin (LEVAQUIN) 500 MG tablet Take 1 tablet (500 mg total) by mouth daily for 7 days. 09/04/21 09/11/21  Harvest Dark, MD  metFORMIN (GLUCOPHAGE) 850 MG tablet Take 1 tablet (850 mg total) by mouth 2 (two) times daily with a meal. 08/04/19 08/03/20  Merlyn Lot, MD  oxyCODONE (ROXICODONE) 5 MG immediate release  tablet Take 1 tablet (5 mg total) by mouth every 4 (four) hours as needed for severe pain. 09/04/21   Harvest Dark, MD  promethazine (PHENERGAN) 25 MG tablet Take 1 tablet (25 mg total) by mouth every 6 (six) hours as needed for nausea or vomiting. 06/02/16 02/22/19  Sable Feil, PA-C    Physical Exam: Vitals:   09/09/21 0000 09/09/21 0030 09/09/21 0100 09/09/21 0130  BP: (!) 136/102 (!) 142/110 127/88 120/89  Pulse: (!) 106 (!) 109 (!) 112 (!) 108  Resp: 19 (!) '22 17 13  ' Temp:      TempSrc:      SpO2: 100% 94% 100% 100%  Weight:      Height:       Physical Exam Vitals and nursing note reviewed.  Constitutional:      General: She is sleeping. She is not in acute distress.    Appearance: Normal appearance. She is ill-appearing.  HENT:     Head: Normocephalic and atraumatic.  Cardiovascular:     Rate and Rhythm: Normal rate and regular rhythm.     Pulses: Normal pulses.     Heart sounds: Normal heart sounds. No murmur heard. Pulmonary:     Effort: Pulmonary effort is normal.     Breath sounds: Normal breath sounds. No wheezing or rhonchi.  Abdominal:     General: Bowel sounds are normal.     Palpations: Abdomen is soft.     Tenderness: There is no abdominal tenderness.  Musculoskeletal:        General: No swelling or tenderness. Normal range of motion.     Cervical back: Normal range of motion and neck supple.  Skin:    General: Skin is warm and dry.  Neurological:     General: No focal deficit present.     Mental Status: She is easily aroused. Mental status is at baseline. She is lethargic.  Psychiatric:        Mood and Affect: Mood normal.        Behavior: Behavior normal.     Data Reviewed: Relevant notes from primary care and specialist visits, past discharge summaries as available in EHR, including Care Everywhere. Prior diagnostic testing as pertinent to current admission diagnoses Updated medications and problem lists for reconciliation ED course,  including vitals, labs, imaging, treatment and response to treatment Triage notes, nursing and pharmacy notes and ED provider's notes Notable results as noted in HPI   Assessment and Plan: * Acute pyelonephritis IV Rocephin IV hydration Patient is tachycardic and tachypneic but with normal WBC.  Will get lactic acid to see if patient meets sepsis criteria Follow cultures  Intractable vomiting Likely all related to acute pyelonephritis  Lipase elevated but with normal LFTs.  UDS negative Differential also includes diabetic gastroparesis Reglan, Zofran, with Compazine for breakthrough  Acute urinary retention Management as above under hydronephrosis  Hydronephrosis of left kidney CT without evidence of acute obstruction Continue Foley catheter due to postvoid residual of 500 mils Possible neurogenic bladder related to diabetes Consider  urology consult in the a.m.  Uncontrolled type 2 diabetes mellitus with hyperglycemia, with long-term current use of insulin (HCC) Received IV insulin 10 units in the ED Sliding scale coverage  Elevated lipase Likely reactive from vomiting and pyelonephritis  Microcytic anemia Recently transfused 1 unit PRBC for hemoglobin of 6.2 Consider outpatient referral for GI work-up as well as GYN referral  Depression Resume home meds when tolerating orally       Advance Care Planning:   Code Status: Prior   Consults: none  Family Communication: none  Severity of Illness: The appropriate patient status for this patient is INPATIENT. Inpatient status is judged to be reasonable and necessary in order to provide the required intensity of service to ensure the patient's safety. The patient's presenting symptoms, physical exam findings, and initial radiographic and laboratory data in the context of their chronic comorbidities is felt to place them at high risk for further clinical deterioration. Furthermore, it is not anticipated that the patient  will be medically stable for discharge from the hospital within 2 midnights of admission.   * I certify that at the point of admission it is my clinical judgment that the patient will require inpatient hospital care spanning beyond 2 midnights from the point of admission due to high intensity of service, high risk for further deterioration and high frequency of surveillance required.*  Author: Athena Masse, MD 09/09/2021 2:47 AM  For on call review [email or web address].

## 2021-09-09 NOTE — Consult Note (Signed)
I have been asked to see the patient by Dr. Dwyane Dee for evaluation and management of right hydronephrosis.  History of present illness: 31 yo woman with hx of DM and recent hospital admission in January 2023 for DKA and sepsis secondary to UTI readmitted for poorly controlled glucose and nausea/vomiting.  CT abd/pelvis obtained showed severely distended bladder with right hydronephrosis.  No obstructing stone identified.  Patient denies flank pain, gross hematuria, hx of kidney stones or recurrent UTIs.    Foley was placed and pt is overall feeling better.    Review of systems: A 12 point comprehensive review of systems was obtained and is negative unless otherwise stated in the history of present illness.  Patient Active Problem List   Diagnosis Date Noted   Hydronephrosis of left kidney 09/09/2021   Acute urinary retention 09/09/2021   Elevated lipase 09/09/2021   Nausea and vomiting 09/09/2021   DKA (diabetic ketoacidosis) ([place]) 07/30/2021   Type 1 diabetes ([place]) 07/30/2021   Leukocytosis 07/30/2021   Thrombocytosis 07/30/2021   Hyponatremia 07/30/2021   Microcytic anemia 07/30/2021   Dehydration 07/30/2021   Severe sepsis ([place]) 07/30/2021   GDM (gestational diabetes mellitus)    History of IUFD    Depression    Premature labor 11/29/2011   History of chlamydia 07/09/2009   UTI (urinary tract infection) 07/09/2009    No current facility-administered medications on file prior to encounter.   Current Outpatient Medications on File Prior to Encounter  Medication Sig Dispense Refill   ferrous sulfate 325 (65 FE) MG tablet Take 1 tablet (325 mg total) by mouth every other day. 30 tablet 0   insulin glargine (LANTUS) 100 UNIT/ML Solostar Pen Inject 12 Units into the skin daily. (Patient taking differently: Inject 12 Units into the skin 3 (three) times daily as needed. Sliding scale) 3.6 mL 0   blood glucose meter kit and supplies KIT Dispense based on patient and insurance  preference. Use up to four times daily as directed. 1 each 0   Insulin Pen Needle (NOVOFINE) 30G X 8 MM MISC Inject 10 each into the skin as needed. 100 each 0   levofloxacin (LEVAQUIN) 500 MG tablet Take 1 tablet (500 mg total) by mouth daily for 7 days. (Patient not taking: Reported on 09/09/2021) 7 tablet 0   metFORMIN (GLUCOPHAGE) 850 MG tablet Take 1 tablet (850 mg total) by mouth 2 (two) times daily with a meal. (Patient not taking: Reported on 09/09/2021) 60 tablet 0   oxyCODONE (ROXICODONE) 5 MG immediate release tablet Take 1 tablet (5 mg total) by mouth every 4 (four) hours as needed for severe pain. (Patient not taking: Reported on 09/09/2021) 8 tablet 0   [DISCONTINUED] promethazine (PHENERGAN) 25 MG tablet Take 1 tablet (25 mg total) by mouth every 6 (six) hours as needed for nausea or vomiting. 12 tablet 0    Past Medical History:  Diagnosis Date   Depression    GDM (gestational diabetes mellitus)    fourth pregnancy   History of chlamydia 2011   History of IUFD    D/t Abruption HELLP   Migraines    Premature labor 11/29/2011   35 weeks   Pyelonephritis 2011    Past Surgical History:  Procedure Laterality Date   tubal ligation  10/10/2014   [facility]    Social History   Tobacco Use   Smoking status: Never   Smokeless tobacco: Never  Substance Use Topics   Alcohol use: No   Drug use: No  Family History  Problem Relation Age of Onset   Hypertension Mother    Hypertension Father    Diabetes Paternal Grandmother        Type 1   Sickle cell trait Other     PE: Vitals:   09/09/21 1100 09/09/21 1212 09/09/21 1400 09/09/21 1432  BP: 113/79 112/80 110/90 113/83  Pulse: (!) 108  (!) 103 (!) 108  Resp: 17  14   Temp:  98.9 F (37.2 C) 98.3 F (36.8 C) 97.9 F (36.6 C)  TempSrc:  Oral Oral Oral  SpO2: 99%  100% 100%  Weight:      Height:       Patient appears to be in no acute distress  patient is alert and oriented x3 Atraumatic normocephalic head No  increased work of breathing, no audible wheezes/rhonchi Regular sinus rhythm/rate Abdomen is soft, nontender, nondistended, no CVA or suprapubic tenderness GU: foley draining very clear urine Lower extremities are symmetric without appreciable edema Grossly neurologically intact No identifiable skin lesions  Recent Labs    09/08/21 2052  WBC 8.6  HGB 9.4*  HCT 31.2*   Recent Labs    09/08/21 2052  NA 133*  K 3.4*  CL 100  CO2 25  GLUCOSE 424*  BUN <5*  CREATININE 0.51  CALCIUM 8.1*   No results for input(s): LABPT, INR in the last 72 hours. No results for input(s): LABURIN in the last 72 hours. Results for orders placed or performed during the hospital encounter of 09/08/21  Resp Panel by RT-PCR (Flu A&B, Covid) Nasopharyngeal Swab     Status: None   Collection Time: 09/09/21  1:48 AM   Specimen: Nasopharyngeal Swab; Nasopharyngeal(NP) swabs in vial transport medium  Result Value Ref Range Status   SARS Coronavirus 2 by RT PCR NEGATIVE NEGATIVE Final    Comment: (NOTE) SARS-CoV-2 target nucleic acids are NOT DETECTED.  The SARS-CoV-2 RNA is generally detectable in upper respiratory specimens during the acute phase of infection. The lowest concentration of SARS-CoV-2 viral copies this assay can detect is 138 copies/mL. A negative result does not preclude SARS-Cov-2 infection and should not be used as the sole basis for treatment or other patient management decisions. A negative result may occur with  improper specimen collection/handling, submission of specimen other than nasopharyngeal swab, presence of viral mutation(s) within the areas targeted by this assay, and inadequate number of viral copies(<138 copies/mL). A negative result must be combined with clinical observations, patient history, and epidemiological information. The expected result is Negative.  Fact Sheet for Patients:  EntrepreneurPulse.com.au  Fact Sheet for Healthcare Providers:   IncredibleEmployment.be  This test is no t yet approved or cleared by the [place] FDA and  has been authorized for detection and/or diagnosis of SARS-CoV-2 by FDA under an Emergency Use Authorization (EUA). This EUA will remain  in effect (meaning this test can be used) for the duration of the COVID-19 declaration under Section 564(b)(1) of the Act, 21 U.S.C.section 360bbb-3(b)(1), unless the authorization is terminated  or revoked sooner.       Influenza A by PCR NEGATIVE NEGATIVE Final   Influenza B by PCR NEGATIVE NEGATIVE Final    Comment: (NOTE) The Xpert Xpress SARS-CoV-2/FLU/RSV plus assay is intended as an aid in the diagnosis of influenza from Nasopharyngeal swab specimens and should not be used as a sole basis for treatment. Nasal washings and aspirates are unacceptable for Xpert Xpress SARS-CoV-2/FLU/RSV testing.  Fact Sheet for Patients: EntrepreneurPulse.com.au  Fact Sheet  for Healthcare Providers: IncredibleEmployment.be  This test is not yet approved or cleared by the [place] and has been authorized for detection and/or diagnosis of SARS-CoV-2 by FDA under an Emergency Use Authorization (EUA). This EUA will remain in effect (meaning this test can be used) for the duration of the COVID-19 declaration under Section 564(b)(1) of the Act, 21 U.S.C. section 360bbb-3(b)(1), unless the authorization is terminated or revoked.  Performed at [facility], [address] [place], [place] [address]     Imaging: CT Abd/Pelivs 09/09/21 IMPRESSION: 1. Marked severity diffuse distal esophageal wall thickening which may represent esophagitis. 2. Marked severity right-sided hydronephrosis and proximal hydroureter which may be secondary to marked severity urinary bladder distension. 3. Heterogeneous appearance of the parenchyma of the left kidney which may be seen in acute pyelonephritis.  Correlation with urinalysis is recommended. 4. Large, left para adnexal cyst likely ovarian in origin.     Electronically Signed   By: Virgina Norfolk M.D.   On: 09/09/2021 02:08    Imp/Recommendations: Right hydronephrosis: -no evidence of obstruction/hematuria/infection; renal function normal; no surgical intervention at this point in time -would repeat renal US after decompression with foley for 48 hrs  2. Urinary retention: -continue foley catheter for 3-4 days -pt will need void trial and may have component of diabetic cystopathy if unable to void    Thank you for involving me in this patient's care.  Please page with any further questions or concerns. Blaiden Werth D Parys Elenbaas

## 2021-09-09 NOTE — ED Notes (Signed)
Bladder scan showed >532 mL urine  ?

## 2021-09-09 NOTE — Assessment & Plan Note (Addendum)
-   Management as above under hydronephrosis ?

## 2021-09-09 NOTE — ED Notes (Signed)
This RN called and spoke with Clovis Riley in the lab, requested urine add one, lipase, and hepatic function panel be added on. Per Clovis Riley will do so in a moment  ?

## 2021-09-09 NOTE — ED Notes (Signed)
Informed RN bed assigned 

## 2021-09-09 NOTE — Assessment & Plan Note (Signed)
Recently transfused 1 unit PRBC for hemoglobin of 6.2 ?Consider outpatient referral for GI work-up as well as GYN referral ?

## 2021-09-09 NOTE — Assessment & Plan Note (Addendum)
-  Last A1c 15.3% on 07/31/2021 ?- Concern for poor compliance with either insulin regimen or diet ?- continue SSI and CBG monitoring  ?- okay to resume Semglee; adjust as needed (change Lantus to "daily" dosing at discharge too ?- needs Novolog (sensitive) SSI prescription at discharge ?- do not resume metformin at d/c given probable T1DM ?

## 2021-09-09 NOTE — Assessment & Plan Note (Addendum)
Possibly multifactorial, UTI, esophagitis as seen on CT, possible gastroparesis ?Lipase elevated but with normal LFTs.  UDS negative ?Reglan, Zofran, with Compazine for breakthrough ?IV Protonix ?- symptoms have improved and tolerating diet well now  ?

## 2021-09-09 NOTE — Hospital Course (Signed)
Chelsea Vazquez is a 31 year old female with PMH type 1 diabetes, depression who presented to the hospital with nausea and vomiting. ?She was recently hospitalized end of January for DKA and severe sepsis due to UTI. ?She underwent CT abdomen/pelvis on admission which showed esophageal wall thickening concerning for esophagitis.  There was also marked right-sided hydronephrosis with proximal hydroureter thought to be due to severe urinary bladder distention. ?Bladder scan was also performed after admission which revealed a large PVR and she underwent indwelling Foley catheter placement. ?

## 2021-09-09 NOTE — Assessment & Plan Note (Addendum)
-   no home med noted on med rec ?

## 2021-09-09 NOTE — Assessment & Plan Note (Signed)
Likely reactive from vomiting and pyelonephritis ?

## 2021-09-09 NOTE — ED Notes (Signed)
Dr. Elesa Massed made aware of pts CBG of 70.  Pt given 8oz orange juice and graham crackers with PB at this time.   ?

## 2021-09-09 NOTE — Assessment & Plan Note (Addendum)
-   CT without evidence of acute obstruction ?- suspect the urinary retention is the cause however not sure true etiology of urinary retention; her uncontrolled diabetes is a large risk factor for neurogenic bladder however she is rather young and states she's had T1DM for 7 years (more common in longer standing uncontrolled diabetes) ?-Renal ultrasound repeated on 09/10/2021 showing improvement in hydronephrosis ?-Catheter removed on 09/11/2021 after discussion with urology.  Continue bladder scans with PVRs; if remains stable with good voiding, probable discharge on 09/12/2021 ?

## 2021-09-09 NOTE — Assessment & Plan Note (Addendum)
-   Patient noted to have urinary retention on admission requiring Foley catheter placement.  Urinalysis only mildly suggestive of UTI showing moderate leukocyte esterase, 21-50 WBC, no bacteria.  It is possible that urine sample not adequate given urinary retention ?- UCx shows 20k colonies with multiple species; forego recollection and complete empiric abx course of 5 days ?-Received 3 doses of Rocephin.  Transitioned to [facility] to complete 2 more days ?

## 2021-09-09 NOTE — Progress Notes (Signed)
Progress Note   Patient: Chelsea Vazquez HYW:[id number] DOB: 20-May-1991 DOA: 09/08/2021     0 DOS: the patient was seen and examined on 09/09/2021   Brief hospital course: Ms. Lasota is a 31 year old female with PMH type 1 diabetes, depression who presented to the hospital with nausea and vomiting. She was recently hospitalized end of January for DKA and severe sepsis due to UTI. She underwent CT abdomen/pelvis on admission which showed esophageal wall thickening concerning for esophagitis.  There was also marked right-sided hydronephrosis with proximal hydroureter thought to be due to severe urinary bladder distention. Bladder scan was also performed after admission which revealed a large PVR and she underwent indwelling Foley catheter placement.  Assessment and Plan: * UTI (urinary tract infection) - Patient noted to have urinary retention on admission requiring Foley catheter placement.  Urinalysis only mildly suggestive of UTI showing moderate leukocyte esterase, 21-50 WBC, no bacteria.  It is possible that urine sample not adequate given urinary retention -Continue Rocephin - Follow-up urine culture -Continue Foley  Nausea and vomiting Possibly multifactorial, UTI, esophagitis as seen on CT, possible gastroparesis Lipase elevated but with normal LFTs.  UDS negative Reglan, Zofran, with Compazine for breakthrough IV Protonix  Acute urinary retention - Management as above under hydronephrosis -Continue Foley  Hydronephrosis of left kidney - CT without evidence of acute obstruction - suspect the urinary retention is the cause however not sure true etiology of urinary retention; her uncontrolled diabetes is a large risk factor for neurogenic bladder however she is rather young and states she's had T1DM for 7 years - continue foley  - urology consulted for further input  Type 1 diabetes (HCC) -Last A1c 15.3% on 07/31/2021 - Concern for poor compliance with either insulin regimen  or diet - continue SSI and CBG monitoring   Elevated lipase Likely reactive from vomiting and pyelonephritis  Microcytic anemia Recently transfused 1 unit PRBC for hemoglobin of 6.2 Consider outpatient referral for GI work-up as well as GYN referral  Depression Resume home meds when tolerating orally     Subjective: Resting in bed when seen in the ER this morning.  She denied any urinary symptoms nor any lower back pain.  Vomiting and generalized soreness in her extremities was her main reason for presenting to the hospital.  Physical Exam: Vitals:   09/09/21 0653 09/09/21 0800 09/09/21 1100 09/09/21 1212  BP: 97/66  113/79 112/80  Pulse: (!) 105  (!) 108   Resp: 13  17   Temp: 98.6 F (37 C) 98.7 F (37.1 C)  98.9 F (37.2 C)  TempSrc: Oral Oral  Oral  SpO2: 97%  99%   Weight:      Height:       Physical Exam Constitutional:      Comments: Thin young woman lying in bed in no distress  HENT:     Head: Normocephalic and atraumatic.     Mouth/Throat:     Mouth: Mucous membranes are moist.  Eyes:     Extraocular Movements: Extraocular movements intact.  Cardiovascular:     Rate and Rhythm: Normal rate and regular rhythm.  Pulmonary:     Effort: Pulmonary effort is normal.     Breath sounds: Normal breath sounds.  Abdominal:     General: Bowel sounds are normal. There is no distension.     Palpations: Abdomen is soft.     Tenderness: There is no abdominal tenderness. There is no right CVA tenderness or left CVA tenderness.  Genitourinary:    Comments: Foley in place with clear yellow urine noted in bag Musculoskeletal:        General: Normal range of motion.     Cervical back: Normal range of motion and neck supple.  Skin:    General: Skin is warm and dry.  Neurological:     General: No focal deficit present.  Psychiatric:        Mood and Affect: Mood normal.        Behavior: Behavior normal.     Data Reviewed: Results for orders placed or performed  during the hospital encounter of 09/08/21 (from the past 24 hour(s))  Basic metabolic panel     Status: Abnormal   Collection Time: 09/08/21  8:52 PM  Result Value Ref Range   Sodium 133 (L) 135 - 145 mmol/L   Potassium 3.4 (L) 3.5 - 5.1 mmol/L   Chloride 100 98 - 111 mmol/L   CO2 25 22 - 32 mmol/L   Glucose, Bld 424 (H) 70 - 99 mg/dL   BUN <5 (L) 6 - 20 mg/dL   Creatinine, Ser 4.82 0.44 - 1.00 mg/dL   Calcium 8.1 (L) 8.9 - 10.3 mg/dL   GFR, Estimated >50 >03 mL/min   Anion gap 8 5 - 15  CBC     Status: Abnormal   Collection Time: 09/08/21  8:52 PM  Result Value Ref Range   WBC 8.6 4.0 - 10.5 K/uL   RBC 4.12 3.87 - 5.11 MIL/uL   Hemoglobin 9.4 (L) 12.0 - 15.0 g/dL   HCT 70.4 (L) 88.8 - 91.6 %   MCV 75.7 (L) 80.0 - 100.0 fL   MCH 22.8 (L) 26.0 - 34.0 pg   MCHC 30.1 30.0 - 36.0 g/dL   RDW 94.5 (H) 03.8 - 88.2 %   Platelets 307 150 - 400 K/uL   nRBC 0.0 0.0 - 0.2 %  Urinalysis, Routine w reflex microscopic     Status: Abnormal   Collection Time: 09/08/21  8:52 PM  Result Value Ref Range   Color, Urine STRAW (A) YELLOW   APPearance HAZY (A) CLEAR   Specific Gravity, Urine 1.020 1.005 - 1.030   pH 7.0 5.0 - 8.0   Glucose, UA >=500 (A) NEGATIVE mg/dL   Hgb urine dipstick NEGATIVE NEGATIVE   Bilirubin Urine NEGATIVE NEGATIVE   Ketones, ur NEGATIVE NEGATIVE mg/dL   Protein, ur NEGATIVE NEGATIVE mg/dL   Nitrite NEGATIVE NEGATIVE   Leukocytes,Ua MODERATE (A) NEGATIVE   RBC / HPF 21-50 0 - 5 RBC/hpf   WBC, UA 21-50 0 - 5 WBC/hpf   Bacteria, UA NONE SEEN NONE SEEN   Squamous Epithelial / LPF 11-20 0 - 5  CBG monitoring, ED     Status: Abnormal   Collection Time: 09/08/21  8:53 PM  Result Value Ref Range   Glucose-Capillary 410 (H) 70 - 99 mg/dL   Comment 1 Notify RN    Comment 2 Document in Chart   POC urine preg, ED     Status: None   Collection Time: 09/08/21  9:02 PM  Result Value Ref Range   Preg Test, Ur NEGATIVE NEGATIVE  CBG monitoring, ED     Status: None    Collection Time: 09/08/21 11:57 PM  Result Value Ref Range   Glucose-Capillary 70 70 - 99 mg/dL  CBG monitoring, ED     Status: Abnormal   Collection Time: 09/09/21  1:00 AM  Result Value Ref Range   Glucose-Capillary 125 (H)  70 - 99 mg/dL  Hepatic function panel     Status: Abnormal   Collection Time: 09/09/21  1:05 AM  Result Value Ref Range   Total Protein 5.9 (L) 6.5 - 8.1 g/dL   Albumin 2.7 (L) 3.5 - 5.0 g/dL   AST 17 15 - 41 U/L   ALT 9 0 - 44 U/L   Alkaline Phosphatase 45 38 - 126 U/L   Total Bilirubin 0.4 0.3 - 1.2 mg/dL   Bilirubin, Direct <9.9 0.0 - 0.2 mg/dL   Indirect Bilirubin NOT CALCULATED 0.3 - 0.9 mg/dL  Lipase, blood     Status: Abnormal   Collection Time: 09/09/21  1:05 AM  Result Value Ref Range   Lipase 59 (H) 11 - 51 U/L  Urine Drug Screen, Qualitative ([facility] only)     Status: None   Collection Time: 09/09/21  1:08 AM  Result Value Ref Range   Tricyclic, Ur Screen NONE DETECTED NONE DETECTED   Amphetamines, Ur Screen NONE DETECTED NONE DETECTED   MDMA (Ecstasy)Ur Screen NONE DETECTED NONE DETECTED   Cocaine Metabolite,Ur [place] NONE DETECTED NONE DETECTED   Opiate, Ur Screen NONE DETECTED NONE DETECTED   Phencyclidine (PCP) Ur S NONE DETECTED NONE DETECTED   Cannabinoid 50 Ng, Ur Eunice NONE DETECTED NONE DETECTED   Barbiturates, Ur Screen NONE DETECTED NONE DETECTED   Benzodiazepine, Ur Scrn NONE DETECTED NONE DETECTED   Methadone Scn, Ur NONE DETECTED NONE DETECTED  Resp Panel by RT-PCR (Flu A&B, Covid) Nasopharyngeal Swab     Status: None   Collection Time: 09/09/21  1:48 AM   Specimen: Nasopharyngeal Swab; Nasopharyngeal(NP) swabs in vial transport medium  Result Value Ref Range   SARS Coronavirus 2 by RT PCR NEGATIVE NEGATIVE   Influenza A by PCR NEGATIVE NEGATIVE   Influenza B by PCR NEGATIVE NEGATIVE  CBG monitoring, ED     Status: Abnormal   Collection Time: 09/09/21  8:48 AM  Result Value Ref Range   Glucose-Capillary 245 (H) 70 - 99 mg/dL  CBG  monitoring, ED     Status: Abnormal   Collection Time: 09/09/21 11:45 AM  Result Value Ref Range   Glucose-Capillary 190 (H) 70 - 99 mg/dL    I have Reviewed nursing notes, Vitals, and Lab results since pt's last encounter. Pertinent lab results : see above I have ordered test including BMP, CBC, Mg I have reviewed the last note from staff over past 24 hours I have discussed pt's care plan and test results with nursing staff, case manager   Family Communication:   Disposition: Status is: Inpatient Remains inpatient appropriate because: Treatment as outlined in A&P   Planned Discharge Destination: Home  Antimicrobials:   Consultants: Urology  Procedures:    DVT ppx:  enoxaparin (LOVENOX) injection 40 mg Start: 09/09/21 0800     Code Status: Full Code    Author: Lewie Chamber, MD 09/09/2021 12:59 PM  For on call review [email or web address].

## 2021-09-10 ENCOUNTER — Inpatient Hospital Stay: Payer: Medicaid Other

## 2021-09-10 DIAGNOSIS — E109 Type 1 diabetes mellitus without complications: Secondary | ICD-10-CM | POA: Diagnosis not present

## 2021-09-10 DIAGNOSIS — R338 Other retention of urine: Secondary | ICD-10-CM | POA: Diagnosis not present

## 2021-09-10 DIAGNOSIS — N1330 Unspecified hydronephrosis: Secondary | ICD-10-CM | POA: Diagnosis not present

## 2021-09-10 DIAGNOSIS — N3000 Acute cystitis without hematuria: Secondary | ICD-10-CM | POA: Diagnosis not present

## 2021-09-10 LAB — CBC WITH DIFFERENTIAL/PLATELET
Abs Immature Granulocytes: 0.03 10*3/uL (ref 0.00–0.07)
Basophils Absolute: 0.1 10*3/uL (ref 0.0–0.1)
Basophils Relative: 1 %
Eosinophils Absolute: 0.1 10*3/uL (ref 0.0–0.5)
Eosinophils Relative: 1 %
HCT: 27.4 % — ABNORMAL LOW (ref 36.0–46.0)
Hemoglobin: 8.3 g/dL — ABNORMAL LOW (ref 12.0–15.0)
Immature Granulocytes: 0 %
Lymphocytes Relative: 27 %
Lymphs Abs: 2.3 10*3/uL (ref 0.7–4.0)
MCH: 22.7 pg — ABNORMAL LOW (ref 26.0–34.0)
MCHC: 30.3 g/dL (ref 30.0–36.0)
MCV: 74.9 fL — ABNORMAL LOW (ref 80.0–100.0)
Monocytes Absolute: 0.7 10*3/uL (ref 0.1–1.0)
Monocytes Relative: 9 %
Neutro Abs: 5.2 10*3/uL (ref 1.7–7.7)
Neutrophils Relative %: 62 %
Platelets: 281 10*3/uL (ref 150–400)
RBC: 3.66 MIL/uL — ABNORMAL LOW (ref 3.87–5.11)
RDW: 16.5 % — ABNORMAL HIGH (ref 11.5–15.5)
WBC: 8.4 10*3/uL (ref 4.0–10.5)
nRBC: 0 % (ref 0.0–0.2)

## 2021-09-10 LAB — MAGNESIUM: Magnesium: 1.4 mg/dL — ABNORMAL LOW (ref 1.7–2.4)

## 2021-09-10 LAB — GLUCOSE, CAPILLARY
Glucose-Capillary: 214 mg/dL — ABNORMAL HIGH (ref 70–99)
Glucose-Capillary: 141 mg/dL — ABNORMAL HIGH (ref 70–99)
Glucose-Capillary: 131 mg/dL — ABNORMAL HIGH (ref 70–99)
Glucose-Capillary: 204 mg/dL — ABNORMAL HIGH (ref 70–99)

## 2021-09-10 LAB — BASIC METABOLIC PANEL
Anion gap: 8 (ref 5–15)
BUN: <5 mg/dL — ABNORMAL LOW (ref 6–20)
CO2: 23 mmol/L (ref 22–32)
Calcium: 7.6 mg/dL — ABNORMAL LOW (ref 8.9–10.3)
Chloride: 107 mmol/L (ref 98–111)
Creatinine, Ser: 0.37 mg/dL — ABNORMAL LOW (ref 0.44–1.00)
GFR, Estimated: >60 mL/min (ref >60)
Glucose, Bld: 204 mg/dL — ABNORMAL HIGH (ref 70–99)
Potassium: 3.1 mmol/L — ABNORMAL LOW (ref 3.5–5.1)
Sodium: 138 mmol/L (ref 135–145)

## 2021-09-10 LAB — URINE CULTURE: Culture: 20000 — AB

## 2021-09-10 MED ORDER — MAGNESIUM SULFATE 4 GM/100ML IV SOLN
4.0000 g | Freq: Once | INTRAVENOUS | Status: AC
  Administered 2021-09-10: 4 g via INTRAVENOUS
  Filled 2021-09-10: qty 100

## 2021-09-10 MED ORDER — POTASSIUM CHLORIDE CRYS ER 20 MEQ PO TBCR
40.0000 meq | EXTENDED_RELEASE_TABLET | ORAL | Status: AC
  Administered 2021-09-10 (×2): 40 meq via ORAL
  Filled 2021-09-10 (×3): qty 2

## 2021-09-10 NOTE — Progress Notes (Signed)
?Progress Note ? ? ?Patient: Chelsea Vazquez FHL:[id number] DOB: 11/12/1990 DOA: 09/08/2021     1 ?DOS: the patient was seen and examined on 09/10/2021 ?  ?Brief hospital course: ?Ms. Raduenz is a 31 year old female with PMH type 1 diabetes, depression who presented to the hospital with nausea and vomiting. ?She was recently hospitalized end of January for DKA and severe sepsis due to UTI. ?She underwent CT abdomen/pelvis on admission which showed esophageal wall thickening concerning for esophagitis.  There was also marked right-sided hydronephrosis with proximal hydroureter thought to be due to severe urinary bladder distention. ?Bladder scan was also performed after admission which revealed a large PVR and she underwent indwelling Foley catheter placement. ? ?Assessment and Plan: ?* UTI (urinary tract infection) ?- Patient noted to have urinary retention on admission requiring Foley catheter placement.  Urinalysis only mildly suggestive of UTI showing moderate leukocyte esterase, 21-50 WBC, no bacteria.  It is possible that urine sample not adequate given urinary retention ?- UCx shows 20k colonies with multiple species; forego recollection and complete empiric abx course of 5 days ?-Continue Rocephin until Monday, then can transition to oral route ?-Continue Foley ? ?Acute urinary retention ?- Management as above under hydronephrosis ?-Continue Foley ? ?Hydronephrosis of right kidney ?- CT without evidence of acute obstruction ?- suspect the urinary retention is the cause however not sure true etiology of urinary retention; her uncontrolled diabetes is a large risk factor for neurogenic bladder however she is rather young and states she's had T1DM for 7 years (more common in longer standing uncontrolled diabetes) ?- continue foley; discussed with urology; will perform TOV on Monday morning and continue PVRs to see response after foley removal ? ?Nausea and vomiting-resolved as of 09/10/2021 ?Possibly  multifactorial, UTI, esophagitis as seen on CT, possible gastroparesis ?Lipase elevated but with normal LFTs.  UDS negative ?Reglan, Zofran, with Compazine for breakthrough ?IV Protonix ?- symptoms have improved and tolerating diet well now  ? ?Type 1 diabetes (HCC) ?-Last A1c 15.3% on 07/31/2021 ?- Concern for poor compliance with either insulin regimen or diet ?- continue SSI and CBG monitoring  ? ?Elevated lipase ?Likely reactive from vomiting and pyelonephritis ? ?Microcytic anemia ?Recently transfused 1 unit PRBC for hemoglobin of 6.2 ?Consider outpatient referral for GI work-up as well as GYN referral ? ?Depression ?- no home med noted on med rec ? ? ? ? ?Subjective: No events overnight. Foley draining urine very well since placement. Appetite also has improved and she was eating breakfast easily this morning.  ? ? ?Physical Exam: ?Vitals:  ? 09/09/21 1432 09/09/21 2011 09/10/21 0430 09/10/21 0750  ?BP: 113/83 109/70 114/82 (!) 127/92  ?Pulse: (!) 108 (!) 106 98 100  ?Resp:  15 16 18   ?Temp: 97.9 ?F (36.6 ?C) 98.2 ?F (36.8 ?C) 98.2 ?F (36.8 ?C) 98.2 ?F (36.8 ?C)  ?TempSrc: Oral Oral Oral Oral  ?SpO2: 100% 100% 98% 100%  ?Weight:      ?Height:      ? ?Physical Exam ?Constitutional:   ?   Comments: Thin young woman lying in bed in no distress  ?HENT:  ?   Head: Normocephalic and atraumatic.  ?   Mouth/Throat:  ?   Mouth: Mucous membranes are moist.  ?Eyes:  ?   Extraocular Movements: Extraocular movements intact.  ?Cardiovascular:  ?   Rate and Rhythm: Normal rate and regular rhythm.  ?Pulmonary:  ?   Effort: Pulmonary effort is normal.  ?   Breath sounds: Normal breath  sounds.  ?Abdominal:  ?   General: Bowel sounds are normal. There is no distension.  ?   Palpations: Abdomen is soft.  ?   Tenderness: There is no abdominal tenderness. There is no right CVA tenderness or left CVA tenderness.  ?Genitourinary: ?   Comments: Foley in place with clear yellow urine noted in bag ?Musculoskeletal:     ?   General:  Normal range of motion.  ?   Cervical back: Normal range of motion and neck supple.  ?Skin: ?   General: Skin is warm and dry.  ?Neurological:  ?   General: No focal deficit present.  ?Psychiatric:     ?   Mood and Affect: Mood normal.     ?   Behavior: Behavior normal.  ? ? ? ?Data Reviewed: ?Results for orders placed or performed during the hospital encounter of 09/08/21 (from the past 24 hour(s))  ?Glucose, capillary     Status: Abnormal  ? Collection Time: 09/09/21  4:45 PM  ?Result Value Ref Range  ? Glucose-Capillary 130 (H) 70 - 99 mg/dL  ? Comment 1 Notify RN   ? Comment 2 Document in Chart   ?Glucose, capillary     Status: Abnormal  ? Collection Time: 09/09/21  9:03 PM  ?Result Value Ref Range  ? Glucose-Capillary 109 (H) 70 - 99 mg/dL  ?Basic metabolic panel     Status: Abnormal  ? Collection Time: 09/10/21  4:58 AM  ?Result Value Ref Range  ? Sodium 138 135 - 145 mmol/L  ? Potassium 3.1 (L) 3.5 - 5.1 mmol/L  ? Chloride 107 98 - 111 mmol/L  ? CO2 23 22 - 32 mmol/L  ? Glucose, Bld 204 (H) 70 - 99 mg/dL  ? BUN <5 (L) 6 - 20 mg/dL  ? Creatinine, Ser 0.37 (L) 0.44 - 1.00 mg/dL  ? Calcium 7.6 (L) 8.9 - 10.3 mg/dL  ? GFR, Estimated >60 >60 mL/min  ? Anion gap 8 5 - 15  ?CBC with Differential/Platelet     Status: Abnormal  ? Collection Time: 09/10/21  4:58 AM  ?Result Value Ref Range  ? WBC 8.4 4.0 - 10.5 K/uL  ? RBC 3.66 (L) 3.87 - 5.11 MIL/uL  ? Hemoglobin 8.3 (L) 12.0 - 15.0 g/dL  ? HCT 27.4 (L) 36.0 - 46.0 %  ? MCV 74.9 (L) 80.0 - 100.0 fL  ? MCH 22.7 (L) 26.0 - 34.0 pg  ? MCHC 30.3 30.0 - 36.0 g/dL  ? RDW 16.5 (H) 11.5 - 15.5 %  ? Platelets 281 150 - 400 K/uL  ? nRBC 0.0 0.0 - 0.2 %  ? Neutrophils Relative % 62 %  ? Neutro Abs 5.2 1.7 - 7.7 K/uL  ? Lymphocytes Relative 27 %  ? Lymphs Abs 2.3 0.7 - 4.0 K/uL  ? Monocytes Relative 9 %  ? Monocytes Absolute 0.7 0.1 - 1.0 K/uL  ? Eosinophils Relative 1 %  ? Eosinophils Absolute 0.1 0.0 - 0.5 K/uL  ? Basophils Relative 1 %  ? Basophils Absolute 0.1 0.0 - 0.1 K/uL   ? Immature Granulocytes 0 %  ? Abs Immature Granulocytes 0.03 0.00 - 0.07 K/uL  ?Magnesium     Status: Abnormal  ? Collection Time: 09/10/21  4:58 AM  ?Result Value Ref Range  ? Magnesium 1.4 (L) 1.7 - 2.4 mg/dL  ?Glucose, capillary     Status: Abnormal  ? Collection Time: 09/10/21  7:48 AM  ?Result Value Ref Range  ? Glucose-Capillary 214 (  H) 70 - 99 mg/dL  ? Comment 1 Notify RN   ? Comment 2 Document in Chart   ?Glucose, capillary     Status: Abnormal  ? Collection Time: 09/10/21 11:37 AM  ?Result Value Ref Range  ? Glucose-Capillary 141 (H) 70 - 99 mg/dL  ? Comment 1 Notify RN   ? Comment 2 Document in Chart   ?  ?I have Reviewed nursing notes, Vitals, and Lab results since pt's last encounter. Pertinent lab results : see above ?I have ordered test including BMP, CBC, Mg ?I have reviewed the last note from staff over past 24 hours ?I have discussed pt's care plan and test results with nursing staff, case manager ? ? ?Family Communication:  ? ?Disposition: ?Status is: Inpatient ?Remains inpatient appropriate because: Treatment as outlined in A&P ? ? Planned Discharge Destination: Home ? ?Antimicrobials: ? ? ?Consultants: ?Urology ? ?Procedures:  ? ? ?DVT ppx:  ?enoxaparin (LOVENOX) injection 40 mg Start: 09/09/21 0800 ? ? ?  Code Status: Full Code  ? ? ?Author: ?Lewie Chamber, MD ?09/10/2021 1:57 PM ? ?For on call review [email or web address].  ? ?

## 2021-09-10 NOTE — TOC CM/SW Note (Signed)
?  Transition of Care (TOC) Screening Note ? ? ?Patient Details  ?Name: Chelsea Vazquez ?Date of Birth: [birth year] ? ? ?Transition of Care (TOC) CM/SW Contact:    ?Fedrick Cefalu E Prudy Candy, LCSW ?Phone Number: ?09/10/2021, 11:32 AM ? ? ? ?Transition of Care Department [facility]) has reviewed patient and no TOC needs have been identified at this time. We will continue to monitor patient advancement through interdisciplinary progression rounds. If new patient transition needs arise, please place a TOC consult. ? ? ?

## 2021-09-11 DIAGNOSIS — N1330 Unspecified hydronephrosis: Secondary | ICD-10-CM | POA: Diagnosis not present

## 2021-09-11 DIAGNOSIS — R338 Other retention of urine: Secondary | ICD-10-CM | POA: Diagnosis not present

## 2021-09-11 DIAGNOSIS — N3000 Acute cystitis without hematuria: Secondary | ICD-10-CM | POA: Diagnosis not present

## 2021-09-11 DIAGNOSIS — E109 Type 1 diabetes mellitus without complications: Secondary | ICD-10-CM | POA: Diagnosis not present

## 2021-09-11 LAB — GLUCOSE, CAPILLARY
Glucose-Capillary: 244 mg/dL — ABNORMAL HIGH (ref 70–99)
Glucose-Capillary: 197 mg/dL — ABNORMAL HIGH (ref 70–99)
Glucose-Capillary: 211 mg/dL — ABNORMAL HIGH (ref 70–99)
Glucose-Capillary: 202 mg/dL — ABNORMAL HIGH (ref 70–99)

## 2021-09-11 LAB — CBC WITH DIFFERENTIAL/PLATELET
Abs Immature Granulocytes: 0.04 10*3/uL (ref 0.00–0.07)
Basophils Absolute: 0.1 10*3/uL (ref 0.0–0.1)
Basophils Relative: 1 %
Eosinophils Absolute: 0.1 10*3/uL (ref 0.0–0.5)
Eosinophils Relative: 2 %
HCT: 30.7 % — ABNORMAL LOW (ref 36.0–46.0)
Hemoglobin: 9 g/dL — ABNORMAL LOW (ref 12.0–15.0)
Immature Granulocytes: 1 %
Lymphocytes Relative: 32 %
Lymphs Abs: 2 10*3/uL (ref 0.7–4.0)
MCH: 22.6 pg — ABNORMAL LOW (ref 26.0–34.0)
MCHC: 29.3 g/dL — ABNORMAL LOW (ref 30.0–36.0)
MCV: 77.1 fL — ABNORMAL LOW (ref 80.0–100.0)
Monocytes Absolute: 0.6 10*3/uL (ref 0.1–1.0)
Monocytes Relative: 9 %
Neutro Abs: 3.6 10*3/uL (ref 1.7–7.7)
Neutrophils Relative %: 55 %
Platelets: 133 10*3/uL — ABNORMAL LOW (ref 150–400)
RBC: 3.98 MIL/uL (ref 3.87–5.11)
RDW: 16.9 % — ABNORMAL HIGH (ref 11.5–15.5)
WBC: 6.5 10*3/uL (ref 4.0–10.5)
nRBC: 0 % (ref 0.0–0.2)

## 2021-09-11 LAB — BASIC METABOLIC PANEL
Anion gap: 7 (ref 5–15)
BUN: <5 mg/dL — ABNORMAL LOW (ref 6–20)
CO2: 18 mmol/L — ABNORMAL LOW (ref 22–32)
Calcium: 7.9 mg/dL — ABNORMAL LOW (ref 8.9–10.3)
Chloride: 111 mmol/L (ref 98–111)
Creatinine, Ser: 0.42 mg/dL — ABNORMAL LOW (ref 0.44–1.00)
GFR, Estimated: >60 mL/min (ref >60)
Glucose, Bld: 282 mg/dL — ABNORMAL HIGH (ref 70–99)
Potassium: 4.8 mmol/L (ref 3.5–5.1)
Sodium: 136 mmol/L (ref 135–145)

## 2021-09-11 LAB — MAGNESIUM: Magnesium: 2.3 mg/dL (ref 1.7–2.4)

## 2021-09-11 MED ORDER — INSULIN GLARGINE-YFGN 100 UNIT/ML ~~LOC~~ SOLN
6.0000 [IU] | Freq: Every day | SUBCUTANEOUS | Status: DC
  Administered 2021-09-11 – 2021-09-12 (×2): 6 [IU] via SUBCUTANEOUS
  Filled 2021-09-11 (×2): qty 0.06

## 2021-09-11 MED ORDER — CEFDINIR 300 MG PO CAPS
600.0000 mg | ORAL_CAPSULE | Freq: Every day | ORAL | Status: AC
  Administered 2021-09-11 – 2021-09-12 (×2): 600 mg via ORAL
  Filled 2021-09-11 (×2): qty 2

## 2021-09-11 NOTE — Progress Notes (Addendum)
Inpatient Diabetes Program Recommendations ? ?AACE/ADA: New Consensus Statement on Inpatient Glycemic Control (2015) ? ?Target Ranges:  Prepandial:   less than 140 mg/dL ?     Peak postprandial:   less than 180 mg/dL (1-2 hours) ?     Critically ill patients:  140 - 180 mg/dL  ? ? Latest Reference Range & Units 09/10/21 07:48 09/10/21 11:37 09/10/21 16:33 09/10/21 21:13  ?Glucose-Capillary 70 - 99 mg/dL 214 (H) ? ?3 units Novolog ? 141 (H) ? ?1 unit Novolog ? 131 (H) ? ?1 unit Novolog ? 204 (H) ? ?2 units Novolog ?  ?(H): Data is abnormally high ? Latest Reference Range & Units 09/11/21 07:43  ?Glucose-Capillary 70 - 99 mg/dL 244 (H) ? ?3 units Novolog ?  ?(H): Data is abnormally high ? Latest Reference Range & Units 07/31/21 04:41  ?Hemoglobin A1C 4.8 - 5.6 % 15.3 (H)  ?(H): Data is abnormally high ? ? ?Admit with: UTI ? ?History: Type 1 Diabetes ? ?Home DM Meds: Lantus 12 units Daily ?      Metformin 850 mg BID (NOT taking) ? ?Current Orders: Novolog Sensitive Correction Scale/ SSI (0-9 units) TID AC + HS ? ? ? ? ?Admitted 07/30/2021 through 08/02/2021 with DKA ?Was counseled extensively by the Diabetes RN on both 01/23 and 01/24 ?Was discharged home on Lantus 12 units Daily + Metformin 850 mg BID ? ? ? ?MD- Note CBG 244 this AM.  CBG was 214 yest AM. ? ?Supposed to be taking Lantus insulin at home. ? ?Please consider: ? ?1. Start Semglee 6 units Daily (50% home dose to start) ? ?2. Please consider giving pt a Sensitive Novolog SSi to use at home in addition to her Lantus ? ?Spoke with pt this AM.  Pt stated to me she is NOT taking the Metformin and has been taking the Lantus 12 units BID at home (NOT daily as prescribed on 08/02/2021).  Pt stated she didn't understand she was only supposed to take the Lantus daily.  Interested in using Novolog SSI for home as well to help with CBG elevations. ? ? ? ?Addendum 11:30am--Met w/ pt at bedside.  Pt stated she remembers talking with the Diabetes RN back in January.   States she stopped taking the Metformin at home, but was confused as to how much Lantus insulin to take.  Has been taking the Lantus 12 units BID instead of the prescribed dose of 12 units daily.  Note that the diabetes RN spoke with pt extensively 2 days while pt was here in January.  Discussed with pt that she is likely trending more like a person with Type 1 diabetes and that she may need a rapid-acting insulin like Novolog to take with meals at home.  Explained what Novolog is and how it works.  Also explained the concept of a Sliding scale with the Novolog insulin.  Discussed with pt that I will alert the MD to her lack of Rx for Novolog at home and will ask the MD to consider giving her a Rx for Novolog SSI at time of d/c.  Discussed with pt the importance of good CBG control at home especially given her young age--Encouraged pt to seek care under an Endocrinologist (have placed the names of several local ENDOs in the AVS for pt to consider). ? ? ?--Will follow patient during hospitalization-- ? ?Wyn Quaker RN, MSN, CDE ?Diabetes Coordinator ?Inpatient Glycemic Control Team ?Team Pager: [phone] (8a-5p) ? ? ? ?

## 2021-09-11 NOTE — Progress Notes (Signed)
?Progress Note ? ? ?Patient: Chelsea Vazquez TKZ:[id number] DOB: August 30, 1990 DOA: 09/08/2021     2 ?DOS: the patient was seen and examined on 09/11/2021 ?  ?Brief hospital course: ?Chelsea Vazquez is a 31 year old female with PMH type 1 diabetes, depression who presented to the hospital with nausea and vomiting. ?She was recently hospitalized end of January for DKA and severe sepsis due to UTI. ?She underwent CT abdomen/pelvis on admission which showed esophageal wall thickening concerning for esophagitis.  There was also marked right-sided hydronephrosis with proximal hydroureter thought to be due to severe urinary bladder distention. ?Bladder scan was also performed after admission which revealed a large PVR and she underwent indwelling Foley catheter placement. ? ?Assessment and Plan: ?* UTI (urinary tract infection) ?- Patient noted to have urinary retention on admission requiring Foley catheter placement.  Urinalysis only mildly suggestive of UTI showing moderate leukocyte esterase, 21-50 WBC, no bacteria.  It is possible that urine sample not adequate given urinary retention ?- UCx shows 20k colonies with multiple species; forego recollection and complete empiric abx course of 5 days ?-Received 3 doses of Rocephin.  Transitioned to [facility] to complete 2 more days ? ?Acute urinary retention ?- Management as above under hydronephrosis ? ?Hydronephrosis of right kidney ?- CT without evidence of acute obstruction ?- suspect the urinary retention is the cause however not sure true etiology of urinary retention; her uncontrolled diabetes is a large risk factor for neurogenic bladder however she is rather young and states she's had T1DM for 7 years (more common in longer standing uncontrolled diabetes) ?-Renal ultrasound repeated on 09/10/2021 showing improvement in hydronephrosis ?-Catheter removed on 09/11/2021 after discussion with urology.  Continue bladder scans with PVRs; if remains stable with good voiding, probable  discharge on 09/12/2021 ? ?Type 1 diabetes (HCC) ?-Last A1c 15.3% on 07/31/2021 ?- Concern for poor compliance with either insulin regimen or diet ?- continue SSI and CBG monitoring  ?- okay to resume Semglee; adjust as needed (change Lantus to "daily" dosing at discharge too ?- needs Novolog (sensitive) SSI prescription at discharge ?- do not resume metformin at d/c given probable T1DM ? ?Microcytic anemia ?Recently transfused 1 unit PRBC for hemoglobin of 6.2 ?Consider outpatient referral for GI work-up as well as GYN referral ? ?Nausea and vomiting-resolved as of 09/10/2021 ?Possibly multifactorial, UTI, esophagitis as seen on CT, possible gastroparesis ?Lipase elevated but with normal LFTs.  UDS negative ?Reglan, Zofran, with Compazine for breakthrough ?IV Protonix ?- symptoms have improved and tolerating diet well now  ? ?Elevated lipase ?Likely reactive from vomiting and pyelonephritis ? ?Depression ?- no home med noted on med rec ? ? ? ? ?Subjective: No events overnight. Denies urinary symptoms. Has been draining good urine output since foley was placed. Discussed trial of void today with foley removal, she's ready and amenable.  ? ? ?Physical Exam: ?Vitals:  ? 09/10/21 1601 09/10/21 2002 09/11/21 0345 09/11/21 5732  ?BP: 113/86 110/77 108/79 115/84  ?Pulse: 100 99 97 99  ?Resp: 18 20 15 17   ?Temp: 98.3 ?F (36.8 ?C) 98.6 ?F (37 ?C) 98.6 ?F (37 ?C) 98.2 ?F (36.8 ?C)  ?TempSrc: Oral Oral Oral   ?SpO2: 100% 100% 100% 100%  ?Weight:      ?Height:      ? ?Physical Exam ?Constitutional:   ?   Comments: Thin young woman lying in bed in no distress  ?HENT:  ?   Head: Normocephalic and atraumatic.  ?   Mouth/Throat:  ?  Mouth: Mucous membranes are moist.  ?Eyes:  ?   Extraocular Movements: Extraocular movements intact.  ?Cardiovascular:  ?   Rate and Rhythm: Normal rate and regular rhythm.  ?Pulmonary:  ?   Effort: Pulmonary effort is normal.  ?   Breath sounds: Normal breath sounds.  ?Abdominal:  ?   General: Bowel  sounds are normal. There is no distension.  ?   Palpations: Abdomen is soft.  ?   Tenderness: There is no abdominal tenderness. There is no right CVA tenderness or left CVA tenderness.  ?Genitourinary: ?   Comments: Foley in place with clear yellow urine noted in bag ?Musculoskeletal:     ?   General: Normal range of motion.  ?   Cervical back: Normal range of motion and neck supple.  ?Skin: ?   General: Skin is warm and dry.  ?Neurological:  ?   General: No focal deficit present.  ?Psychiatric:     ?   Mood and Affect: Mood normal.     ?   Behavior: Behavior normal.  ? ? ? ?Data Reviewed: ?Results for orders placed or performed during the hospital encounter of 09/08/21 (from the past 24 hour(s))  ?Glucose, capillary     Status: Abnormal  ? Collection Time: 09/10/21  4:33 PM  ?Result Value Ref Range  ? Glucose-Capillary 131 (H) 70 - 99 mg/dL  ? Comment 1 Notify RN   ? Comment 2 Document in Chart   ?Glucose, capillary     Status: Abnormal  ? Collection Time: 09/10/21  9:13 PM  ?Result Value Ref Range  ? Glucose-Capillary 204 (H) 70 - 99 mg/dL  ?Basic metabolic panel     Status: Abnormal  ? Collection Time: 09/11/21  4:52 AM  ?Result Value Ref Range  ? Sodium 136 135 - 145 mmol/L  ? Potassium 4.8 3.5 - 5.1 mmol/L  ? Chloride 111 98 - 111 mmol/L  ? CO2 18 (L) 22 - 32 mmol/L  ? Glucose, Bld 282 (H) 70 - 99 mg/dL  ? BUN <5 (L) 6 - 20 mg/dL  ? Creatinine, Ser 0.42 (L) 0.44 - 1.00 mg/dL  ? Calcium 7.9 (L) 8.9 - 10.3 mg/dL  ? GFR, Estimated >60 >60 mL/min  ? Anion gap 7 5 - 15  ?CBC with Differential/Platelet     Status: Abnormal  ? Collection Time: 09/11/21  4:52 AM  ?Result Value Ref Range  ? WBC 6.5 4.0 - 10.5 K/uL  ? RBC 3.98 3.87 - 5.11 MIL/uL  ? Hemoglobin 9.0 (L) 12.0 - 15.0 g/dL  ? HCT 30.7 (L) 36.0 - 46.0 %  ? MCV 77.1 (L) 80.0 - 100.0 fL  ? MCH 22.6 (L) 26.0 - 34.0 pg  ? MCHC 29.3 (L) 30.0 - 36.0 g/dL  ? RDW 16.9 (H) 11.5 - 15.5 %  ? Platelets 133 (L) 150 - 400 K/uL  ? nRBC 0.0 0.0 - 0.2 %  ? Neutrophils Relative  % 55 %  ? Neutro Abs 3.6 1.7 - 7.7 K/uL  ? Lymphocytes Relative 32 %  ? Lymphs Abs 2.0 0.7 - 4.0 K/uL  ? Monocytes Relative 9 %  ? Monocytes Absolute 0.6 0.1 - 1.0 K/uL  ? Eosinophils Relative 2 %  ? Eosinophils Absolute 0.1 0.0 - 0.5 K/uL  ? Basophils Relative 1 %  ? Basophils Absolute 0.1 0.0 - 0.1 K/uL  ? Immature Granulocytes 1 %  ? Abs Immature Granulocytes 0.04 0.00 - 0.07 K/uL  ?Magnesium  Status: None  ? Collection Time: 09/11/21  4:52 AM  ?Result Value Ref Range  ? Magnesium 2.3 1.7 - 2.4 mg/dL  ?Glucose, capillary     Status: Abnormal  ? Collection Time: 09/11/21  7:43 AM  ?Result Value Ref Range  ? Glucose-Capillary 244 (H) 70 - 99 mg/dL  ? Comment 1 Notify RN   ? Comment 2 Document in Chart   ?Glucose, capillary     Status: Abnormal  ? Collection Time: 09/11/21 11:50 AM  ?Result Value Ref Range  ? Glucose-Capillary 197 (H) 70 - 99 mg/dL  ? Comment 1 Notify RN   ? Comment 2 Document in Chart   ?  ?I have Reviewed nursing notes, Vitals, and Lab results since pt's last encounter. Pertinent lab results : see above ?I have ordered test including BMP, CBC, Mg ?I have reviewed the last note from staff over past 24 hours ?I have discussed pt's care plan and test results with nursing staff, case manager ? ? ?Family Communication:  ? ?Disposition: ?Status is: Inpatient ?Remains inpatient appropriate because: Treatment as outlined in A&P ? ? Planned Discharge Destination: Home ? ?Antimicrobials: ?Rocephin 3/4 >> 3/6 ?Omnicef 3/6 >> current  ? ?Consultants: ?Urology ? ?Procedures:  ? ? ?DVT ppx:  ?enoxaparin (LOVENOX) injection 40 mg Start: 09/09/21 0800 ? ? ?  Code Status: Full Code  ? ? ?Author: ?Lewie Chamber, MD ?09/11/2021 2:35 PM ? ?For on call review [email or web address].  ? ?

## 2021-09-12 DIAGNOSIS — R112 Nausea with vomiting, unspecified: Secondary | ICD-10-CM | POA: Diagnosis not present

## 2021-09-12 DIAGNOSIS — N3000 Acute cystitis without hematuria: Secondary | ICD-10-CM | POA: Diagnosis not present

## 2021-09-12 DIAGNOSIS — R338 Other retention of urine: Secondary | ICD-10-CM | POA: Diagnosis not present

## 2021-09-12 DIAGNOSIS — N1330 Unspecified hydronephrosis: Secondary | ICD-10-CM | POA: Diagnosis not present

## 2021-09-12 LAB — CBC WITH DIFFERENTIAL/PLATELET
Abs Immature Granulocytes: 0.02 10*3/uL (ref 0.00–0.07)
Basophils Absolute: 0.1 10*3/uL (ref 0.0–0.1)
Basophils Relative: 1 %
Eosinophils Absolute: 0.1 10*3/uL (ref 0.0–0.5)
Eosinophils Relative: 2 %
HCT: 27.6 % — ABNORMAL LOW (ref 36.0–46.0)
Hemoglobin: 8.5 g/dL — ABNORMAL LOW (ref 12.0–15.0)
Immature Granulocytes: 0 %
Lymphocytes Relative: 30 %
Lymphs Abs: 2.6 10*3/uL (ref 0.7–4.0)
MCH: 23.2 pg — ABNORMAL LOW (ref 26.0–34.0)
MCHC: 30.8 g/dL (ref 30.0–36.0)
MCV: 75.4 fL — ABNORMAL LOW (ref 80.0–100.0)
Monocytes Absolute: 0.8 10*3/uL (ref 0.1–1.0)
Monocytes Relative: 10 %
Neutro Abs: 4.9 10*3/uL (ref 1.7–7.7)
Neutrophils Relative %: 57 %
Platelets: 274 10*3/uL (ref 150–400)
RBC: 3.66 MIL/uL — ABNORMAL LOW (ref 3.87–5.11)
RDW: 16.7 % — ABNORMAL HIGH (ref 11.5–15.5)
WBC: 8.5 10*3/uL (ref 4.0–10.5)
nRBC: 0 % (ref 0.0–0.2)

## 2021-09-12 LAB — GLUCOSE, CAPILLARY
Glucose-Capillary: 204 mg/dL — ABNORMAL HIGH (ref 70–99)
Glucose-Capillary: 209 mg/dL — ABNORMAL HIGH (ref 70–99)

## 2021-09-12 LAB — BASIC METABOLIC PANEL
Anion gap: 5 (ref 5–15)
BUN: <5 mg/dL — ABNORMAL LOW (ref 6–20)
CO2: 26 mmol/L (ref 22–32)
Calcium: 8.3 mg/dL — ABNORMAL LOW (ref 8.9–10.3)
Chloride: 105 mmol/L (ref 98–111)
Creatinine, Ser: 0.32 mg/dL — ABNORMAL LOW (ref 0.44–1.00)
GFR, Estimated: >60 mL/min (ref >60)
Glucose, Bld: 207 mg/dL — ABNORMAL HIGH (ref 70–99)
Potassium: 4.1 mmol/L (ref 3.5–5.1)
Sodium: 136 mmol/L (ref 135–145)

## 2021-09-12 LAB — MAGNESIUM: Magnesium: 1.6 mg/dL — ABNORMAL LOW (ref 1.7–2.4)

## 2021-09-12 MED ORDER — INSULIN GLARGINE-YFGN 100 UNIT/ML ~~LOC~~ SOLN
8.0000 [IU] | Freq: Every day | SUBCUTANEOUS | Status: DC
  Filled 2021-09-12: qty 0.08

## 2021-09-12 MED ORDER — INSULIN GLARGINE 100 UNIT/ML SOLOSTAR PEN: 8.0000 [IU] | PEN_INJECTOR | Freq: Every day | SUBCUTANEOUS | 3 refills | Status: AC

## 2021-09-12 MED ORDER — MAGNESIUM SULFATE 2 GM/50ML IV SOLN
2.0000 g | Freq: Once | INTRAVENOUS | Status: AC
  Administered 2021-09-12: 2 g via INTRAVENOUS
  Filled 2021-09-12: qty 50

## 2021-09-12 MED ORDER — INSULIN ASPART 100 UNIT/ML IJ SOLN: 0.0000 [IU] | Freq: Three times a day (TID) | INTRAMUSCULAR | 11 refills | Status: AC

## 2021-09-12 MED ORDER — PANTOPRAZOLE SODIUM 40 MG PO TBEC: 40.0000 mg | DELAYED_RELEASE_TABLET | Freq: Every day | ORAL | Status: DC

## 2021-09-12 NOTE — Progress Notes (Signed)
PHARMACIST - PHYSICIAN COMMUNICATION ? ?DR:   Allena Katz ? ?CONCERNING: IV to Oral Route Change Policy ? ?RECOMMENDATION: ?This patient is receiving pantoprazole by the intravenous route.  Based on criteria approved by the Pharmacy and Therapeutics Committee, the intravenous medication(s) is/are being converted to the equivalent oral dose form(s). ? ? ?DESCRIPTION: ?These criteria include: ?The patient is eating (either orally or via tube) and/or has been taking other orally administered medications for a least 24 hours ?The patient has no evidence of active gastrointestinal bleeding or impaired GI absorption (gastrectomy, short bowel, patient on TNA or NPO). ? ?If you have questions about this conversion, please contact the Pharmacy Department  ?[]   [phone] )  ( [phone] ?[x]   [phone] )  [facility] ?[]   [phone] )  [facility] ?[]   [phone] )  [facility] ?[]   [phone] )  [facility]  ? ?( [phone] Chelsea Vazquez, RPH ?09/12/2021 12:27 PM ? ?

## 2021-09-12 NOTE — Discharge Summary (Signed)
Physician Discharge Summary   Patient: Chelsea Vazquez MRN: [id number] DOB: 11/02/1990  Admit date:     09/08/2021  Discharge date: 09/12/21  Discharge Physician: Fritzi Mandes   PCP: [facility] [place]   Recommendations at discharge:    Keep log of sugars atleast 3 times a day  Discharge Diagnoses: UTI Acute Urinary retention--resolved Type 1 Diabetes, uncontrolled  Hospital Course: Chelsea Vazquez is a 31 year old female with PMH type 1 diabetes, depression who presented to the hospital with nausea and vomiting.  UTI (urinary tract infection) - Patient noted to have urinary retention on admission requiring Foley catheter placement.  Urinalysis only mildly suggestive of UTI showing moderate leukocyte esterase, 21-50 WBC, no bacteria. -Received 3 doses of Rocephin.  Transitioned to [facility] to complete 2 more days   Acute urinary retention - Management as above under hydronephrosis --able to urinate now   Hydronephrosis of right kidney - CT without evidence of acute obstruction -Renal ultrasound repeated on 09/10/2021 showing improvement in hydronephrosis -Catheter removed on 09/11/2021 after discussion with urology.   -- PCP to do Urology referral if symptoms recurr   Type 1 diabetes ([place]) -Last A1c 15.3% on 07/31/2021 - Concern for poor compliance with either insulin regimen or diet - continue SSI and CBG monitoring  - will discharge on Lantus 8 units qd and SSI (novolog) per DM coordinator   Microcytic anemia Recently transfused 1 unit PRBC for hemoglobin of 6.2 Consider outpatient referral for GI work-up as well as GYN referral--will defer to PCP--charles drew   Nausea and vomiting-resolved as of 09/10/2021 Possibly multifactorial, UTI, esophagitis as seen on CT, possible gastroparesis Lipase elevated but with normal LFTs.  UDS negative - symptoms have improved and tolerating diet well now    Overall doing ok. Will discharge to home. Pt agreeable and eager  to go home      Consultants: Urology  Disposition: Home Diet recommendation:  Discharge Diet Orders (From admission, onward)     Start     Ordered   09/12/21 0000  Diet Carb Modified        09/12/21 1350           Carb modified diet DISCHARGE MEDICATION: Allergies as of 09/12/2021       Reactions   Amoxicillin Swelling   Naproxen    Throat swelling/itch   Tylenol [acetaminophen] Swelling        Medication List     STOP taking these medications    levofloxacin 500 MG tablet Commonly known as: Levaquin   metFORMIN 850 MG tablet Commonly known as: Glucophage       TAKE these medications    blood glucose meter kit and supplies Kit Dispense based on patient and insurance preference. Use up to four times daily as directed.   ferrous sulfate 325 (65 FE) MG tablet Take 1 tablet (325 mg total) by mouth every other day.   insulin aspart 100 UNIT/ML injection Commonly known as: novoLOG Inject 0-9 Units into the skin 3 (three) times daily with meals.   insulin glargine 100 UNIT/ML Solostar Pen Commonly known as: LANTUS Inject 8 Units into the skin daily. What changed: how much to take   Insulin Pen Needle 30G X 8 MM Misc Commonly known as: NOVOFINE Inject 10 each into the skin as needed.        Follow-up [facility]. Schedule an appointment as soon as possible for a visit in 1 week(s).  Specialty: General Practice Why: hospital f/u Contact information: [address] [place] [address] [place], [place] .   Specialty: General Practice Contact information: [address] [place] [address] [phone]                Discharge Exam: Danley Danker Weights   09/08/21 2050  Weight: 45.4 kg     Condition at discharge: fair  The results of significant diagnostics from this hospitalization (including imaging, microbiology,  ancillary and laboratory) are listed below for reference.   Imaging Studies: CT ABDOMEN PELVIS W CONTRAST  Result Date: 09/09/2021 CLINICAL DATA:  Pedal edema with urinary frequency, nausea and vomiting. EXAM: CT ABDOMEN AND PELVIS WITH CONTRAST TECHNIQUE: Multidetector CT imaging of the abdomen and pelvis was performed using the standard protocol following bolus administration of intravenous contrast. RADIATION DOSE REDUCTION: This exam was performed according to the departmental dose-optimization program which includes automated exposure control, adjustment of the mA and/or kV according to patient size and/or use of iterative reconstruction technique. CONTRAST:  144m OMNIPAQUE IOHEXOL 300 MG/ML  SOLN COMPARISON:  Nov 30, 2020 FINDINGS: Lower chest: No acute abnormality. Hepatobiliary: No focal liver abnormality is seen. No gallstones, gallbladder wall thickening, or biliary dilatation. Pancreas: Unremarkable. No pancreatic ductal dilatation or surrounding inflammatory changes. Spleen: Normal in size without focal abnormality. Adrenals/Urinary Tract: Adrenal glands are unremarkable. Kidneys are normal in size. A 5 mm focus of parenchymal low attenuation is seen within the posterolateral aspect of the upper pole of the right kidney which is too small to characterize by CT examination. Ill-defined, patchy areas of mildly decreased attenuation are seen involving the parenchyma of the left kidney. There is moderate to marked severity right-sided hydronephrosis and proximal hydroureter. A 3 mm nonobstructing renal calculus is seen within the dependent portion of the right renal pelvis (axial CT image 29, CT series 2). The urinary bladder is markedly distended. Stomach/Bowel: There is marked severity diffuse distal esophageal wall thickening (axial CT images 1 through 8, CT series 2). Stomach is within normal limits. Appendix appears normal. Stool is seen throughout the large bowel. No evidence of bowel wall  thickening, distention, or inflammatory changes. Vascular/Lymphatic: No significant vascular findings are present. No enlarged abdominal or pelvic lymph nodes. Reproductive: The uterus is enlarged and heterogeneous in appearance. A 3.8 cm x 2.6 cm cystic appearing pelvis on the left (axial CT images 54 through 60, CT series 2). This is adjacent to the anterolateral aspect of the left adnexa. Other: No abdominal wall hernia or abnormality. No abdominopelvic ascites. Musculoskeletal: No acute or significant osseous findings. IMPRESSION: 1. Marked severity diffuse distal esophageal wall thickening which may represent esophagitis. 2. Marked severity right-sided hydronephrosis and proximal hydroureter which may be secondary to marked severity urinary bladder distension. 3. Heterogeneous appearance of the parenchyma of the left kidney which may be seen in acute pyelonephritis. Correlation with urinalysis is recommended. 4. Large, left para adnexal cyst likely ovarian in origin. Electronically Signed   By: TVirgina NorfolkM.D.   On: 09/09/2021 02:08   [place]RENAL  Result Date: 09/10/2021 CLINICAL DATA:  Hydronephrosis EXAM: RENAL / URINARY TRACT ULTRASOUND COMPLETE COMPARISON:  September 09, 2021 FINDINGS: Right Kidney: Renal measurements: 12.1 x 4.2 x 5.6 cm = volume: 150 mL. Echogenicity within normal limits. No mass visualized. There is mild RIGHT hydronephrosis. Left Kidney: Renal measurements: 11.4 x 6.4 x 5.4 cm = volume: 203 mL. Echogenicity  within normal limits. No mass or hydronephrosis visualized. Bladder: Decompressed around a Foley catheter Other: None. IMPRESSION: There is mild RIGHT-sided hydronephrosis status post bladder decompression. Electronically Signed   By: Valentino Saxon M.D.   On: 09/10/2021 11:25    Microbiology: Results for orders placed or performed during the hospital encounter of 09/08/21  Urine Culture     Status: Abnormal   Collection Time: 09/08/21  8:56 PM   Specimen: Urine, Clean  Catch  Result Value Ref Range Status   Specimen Description   Final    URINE, CLEAN CATCH Performed at [facility], [address], [place], [place] [address]    Special Requests   Final    NONE Performed at [facility], [address] [place], [place] [address]    Culture (A)  Final    20,000 COLONIES/mL MULTIPLE SPECIES PRESENT, SUGGEST RECOLLECTION   Report Status 09/10/2021 FINAL  Final  Resp Panel by RT-PCR (Flu A&B, Covid) Nasopharyngeal Swab     Status: None   Collection Time: 09/09/21  1:48 AM   Specimen: Nasopharyngeal Swab; Nasopharyngeal(NP) swabs in vial transport medium  Result Value Ref Range Status   SARS Coronavirus 2 by RT PCR NEGATIVE NEGATIVE Final    Comment: (NOTE) SARS-CoV-2 target nucleic acids are NOT DETECTED.  The SARS-CoV-2 RNA is generally detectable in upper respiratory specimens during the acute phase of infection. The lowest concentration of SARS-CoV-2 viral copies this assay can detect is 138 copies/mL. A negative result does not preclude SARS-Cov-2 infection and should not be used as the sole basis for treatment or other patient management decisions. A negative result may occur with  improper specimen collection/handling, submission of specimen other than nasopharyngeal swab, presence of viral mutation(s) within the areas targeted by this assay, and inadequate number of viral copies(<138 copies/mL). A negative result must be combined with clinical observations, patient history, and epidemiological information. The expected result is Negative.  Fact Sheet for Patients:  EntrepreneurPulse.com.au  Fact Sheet for Healthcare Providers:  IncredibleEmployment.be  This test is no t yet approved or cleared by the [place] FDA and  has been authorized for detection and/or diagnosis of SARS-CoV-2 by FDA under an Emergency Use Authorization (EUA). This EUA will remain  in effect (meaning  this test can be used) for the duration of the COVID-19 declaration under Section 564(b)(1) of the Act, 21 U.S.C.section 360bbb-3(b)(1), unless the authorization is terminated  or revoked sooner.       Influenza A by PCR NEGATIVE NEGATIVE Final   Influenza B by PCR NEGATIVE NEGATIVE Final    Comment: (NOTE) The Xpert Xpress SARS-CoV-2/FLU/RSV plus assay is intended as an aid in the diagnosis of influenza from Nasopharyngeal swab specimens and should not be used as a sole basis for treatment. Nasal washings and aspirates are unacceptable for Xpert Xpress SARS-CoV-2/FLU/RSV testing.  Fact Sheet for Patients: EntrepreneurPulse.com.au  Fact Sheet for Healthcare Providers: IncredibleEmployment.be  This test is not yet approved or cleared by the [place] FDA and has been authorized for detection and/or diagnosis of SARS-CoV-2 by FDA under an Emergency Use Authorization (EUA). This EUA will remain in effect (meaning this test can be used) for the duration of the COVID-19 declaration under Section 564(b)(1) of the Act, 21 U.S.C. section 360bbb-3(b)(1), unless the authorization is terminated or revoked.  Performed at [facility], [address] [place], [place] [address]     Labs: CBC: Recent Labs  Lab 09/08/21 2052 09/10/21 7673 09/11/21 [phone]  09/12/21 0548  WBC 8.6 8.4 6.5 8.5  NEUTROABS  --  5.2 3.6 4.9  HGB 9.4* 8.3* 9.0* 8.5*  HCT 31.2* 27.4* 30.7* 27.6*  MCV 75.7* 74.9* 77.1* 75.4*  PLT 307 281 133* 191   Basic Metabolic Panel: Recent Labs  Lab 09/08/21 2052 09/10/21 0458 09/11/21 0452 09/12/21 0354  NA 133* 138 136 136  K 3.4* 3.1* 4.8 4.1  CL 100 107 111 105  CO2 25 23 18* 26  GLUCOSE 424* 204* 282* 207*  BUN <5* <5* <5* <5*  CREATININE 0.51 0.37* 0.42* 0.32*  CALCIUM 8.1* 7.6* 7.9* 8.3*  MG  --  1.4* 2.3 1.6*   Liver Function Tests: Recent Labs  Lab 09/09/21 0105  AST 17  ALT 9  ALKPHOS 45   BILITOT 0.4  PROT 5.9*  ALBUMIN 2.7*   CBG: Recent Labs  Lab 09/11/21 1150 09/11/21 1616 09/11/21 2000 09/12/21 0753 09/12/21 1154  GLUCAP 197* 211* 202* 204* 209*    Discharge time spent: greater than 30 minutes.  Signed: Fritzi Mandes, MD [facility] 09/12/2021

## 2021-09-12 NOTE — Plan of Care (Signed)

## 2021-09-12 NOTE — Plan of Care (Signed)
Patient discharged to home.  PIV removed prior to dishcarge.  Discharge instructions given and reviewed with patient who verbalized understanding.   ?

## 2021-09-12 NOTE — Progress Notes (Signed)
Inpatient Diabetes Program Recommendations ? ?AACE/ADA: New Consensus Statement on Inpatient Glycemic Control (2015) ? ?Target Ranges:  Prepandial:   less than 140 mg/dL ?     Peak postprandial:   less than 180 mg/dL (1-2 hours) ?     Critically ill patients:  140 - 180 mg/dL  ? ? Latest Reference Range & Units 09/11/21 07:43 09/11/21 11:50 09/11/21 16:16 09/11/21 20:00  ?Glucose-Capillary 70 - 99 mg/dL 625 (H) ? ?3 units Novolog ? 197 (H) ? ?2 units Novolog ? 211 (H) ? ?3 units Novolog ? ?6 units Semglee ? ? 202 (H) ? ?2 units Novolog ?  ? ? Latest Reference Range & Units 09/12/21 03:54  ?Glucose 70 - 99 mg/dL 638 (H)  ? ? ?Admit with: UTI ?  ?History: Type 1 Diabetes ?  ?Home DM Meds: Lantus 12 units Daily ?                            Metformin 850 mg BID (NOT taking) ?  ?Current Orders: Novolog Sensitive Correction Scale/ SSI (0-9 units) TID AC + HS ?    Semglee 6 units Daily ?  ?  ?  ?  ?Admitted 07/30/2021 through 08/02/2021 with DKA ?Was counseled extensively by the Diabetes RN on both 01/23 and 01/24 ?Was discharged home on Lantus 12 units Daily + Metformin 850 mg BID ?  ? ?MD- Please consider: ?  ?1. Increase Semglee to 8 units Daily   ? ?2. Please consider giving pt a Sensitive Novolog SSi to use at home in addition to her Lantus ?  ?Spoke with pt yesterday AM.  Pt stated to me she is NOT taking the Metformin and has been taking the Lantus 12 units BID at home (NOT daily as prescribed on 08/02/2021).  Pt stated she didn't understand she was only supposed to take the Lantus daily.  Interested in using Novolog SSI for home as well to help with CBG elevations. ?  ? ? ? ?--Will follow patient during hospitalization-- ? ?Ambrose Finland RN, MSN, CDE ?Diabetes Coordinator ?Inpatient Glycemic Control Team ?Team Pager: [phone] (8a-5p) ? ?  ?

## 2023-02-02 ENCOUNTER — Encounter (HOSPITAL_COMMUNITY): Payer: Self-pay

## 2023-02-02 ENCOUNTER — Other Ambulatory Visit: Payer: Self-pay

## 2023-02-02 ENCOUNTER — Emergency Department (HOSPITAL_COMMUNITY)
Admission: EM | Admit: 2023-02-02 | Discharge: 2023-02-03 | Disposition: A | Discharge status: Home / Self Care | Payer: MEDICAID | Attending: Emergency Medicine | Admitting: Emergency Medicine

## 2023-02-02 DIAGNOSIS — M25552 Pain in left hip: Secondary | ICD-10-CM | POA: Insufficient documentation

## 2023-02-02 DIAGNOSIS — R519 Headache, unspecified: Secondary | ICD-10-CM | POA: Insufficient documentation

## 2023-02-02 DIAGNOSIS — R0789 Other chest pain: Secondary | ICD-10-CM | POA: Diagnosis present

## 2023-02-02 DIAGNOSIS — R52 Pain, unspecified: Secondary | ICD-10-CM
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
## 2023-02-03 ENCOUNTER — Emergency Department (HOSPITAL_COMMUNITY): Payer: MEDICAID

## 2023-02-03 LAB — COMPREHENSIVE METABOLIC PANEL
ALT: 13 U/L (ref 0–44)
AST: 17 U/L (ref 15–41)
Albumin: 4.1 g/dL (ref 3.5–5.0)
Alkaline Phosphatase: 63 U/L (ref 38–126)
Anion gap: 11 (ref 5–15)
BUN: 15 mg/dL (ref 6–20)
CO2: 20 mmol/L — ABNORMAL LOW (ref 22–32)
Calcium: 9.2 mg/dL (ref 8.9–10.3)
Chloride: 99 mmol/L (ref 98–111)
Creatinine, Ser: 0.54 mg/dL (ref 0.44–1.00)
GFR, Estimated: >60 mL/min (ref >60)
Glucose, Bld: 264 mg/dL — ABNORMAL HIGH (ref 70–99)
Potassium: 4.1 mmol/L (ref 3.5–5.1)
Sodium: 130 mmol/L — ABNORMAL LOW (ref 135–145)
Total Bilirubin: 0.8 mg/dL (ref 0.3–1.2)
Total Protein: 8.8 g/dL — ABNORMAL HIGH (ref 6.5–8.1)

## 2023-02-03 LAB — CBC
HCT: 36.4 % (ref 36.0–46.0)
Hemoglobin: 10.6 g/dL — ABNORMAL LOW (ref 12.0–15.0)
MCH: 21.3 pg — ABNORMAL LOW (ref 26.0–34.0)
MCHC: 29.1 g/dL — ABNORMAL LOW (ref 30.0–36.0)
MCV: 73.2 fL — ABNORMAL LOW (ref 80.0–100.0)
Platelets: 308 10*3/uL (ref 150–400)
RBC: 4.97 MIL/uL (ref 3.87–5.11)
RDW: 16 % — ABNORMAL HIGH (ref 11.5–15.5)
WBC: 10.1 10*3/uL (ref 4.0–10.5)
nRBC: 0 % (ref 0.0–0.2)

## 2023-02-03 LAB — HCG, QUANTITATIVE, PREGNANCY: hCG, Beta Chain, Quant, S: <1 m[IU]/mL (ref <5)

## 2023-02-03 MED ORDER — METHOCARBAMOL 500 MG PO TABS: 500.0000 mg | ORAL_TABLET | Freq: Two times a day (BID) | ORAL | 0 refills | Status: AC | PRN

## 2023-02-03 MED ORDER — SODIUM CHLORIDE (PF) 0.9 % IJ SOLN
INTRAMUSCULAR | Status: AC
  Filled 2023-02-03: qty 50

## 2023-02-03 MED ORDER — IOHEXOL 300 MG/ML  SOLN: 80.0000 mL | Freq: Once | INTRAMUSCULAR | Status: DC | PRN

## 2023-02-03 NOTE — Discharge Instructions (Addendum)
Alternate ice and heat to areas of injury 3-4 times per day to limit inflammation and spasm.  We recommend Robaxin for muscle spasms.  Do not drive or drink alcohol after taking this medication as it may make you drowsy and impair your judgment.  Follow-up with a primary care doctor to ensure resolution of symptoms.  Return to the ED for any new or concerning symptoms.

## 2023-02-03 NOTE — Progress Notes (Signed)
RN at bedside giving pt discharge instructions upon arrival to start IV.
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
